# Patient Record
Sex: Male | Born: 1965
Health system: Southern US, Community
[De-identification: ages and names within clinical notes are randomized; demographics above are authoritative.]

## PROBLEM LIST (undated history)

## (undated) DIAGNOSIS — I2510 Atherosclerotic heart disease of native coronary artery without angina pectoris: Secondary | ICD-10-CM

## (undated) DIAGNOSIS — J45909 Unspecified asthma, uncomplicated: Secondary | ICD-10-CM

## (undated) DIAGNOSIS — I214 Non-ST elevation (NSTEMI) myocardial infarction: Secondary | ICD-10-CM

## (undated) DIAGNOSIS — E785 Hyperlipidemia, unspecified: Secondary | ICD-10-CM

## (undated) DIAGNOSIS — L409 Psoriasis, unspecified: Secondary | ICD-10-CM

## (undated) DIAGNOSIS — J019 Acute sinusitis, unspecified: Secondary | ICD-10-CM

## (undated) DIAGNOSIS — J310 Chronic rhinitis: Secondary | ICD-10-CM

## (undated) DIAGNOSIS — I10 Essential (primary) hypertension: Secondary | ICD-10-CM

## (undated) DIAGNOSIS — I341 Nonrheumatic mitral (valve) prolapse: Secondary | ICD-10-CM

## (undated) DIAGNOSIS — Z9889 Other specified postprocedural states: Secondary | ICD-10-CM

## (undated) DIAGNOSIS — I249 Acute ischemic heart disease, unspecified: Secondary | ICD-10-CM

## (undated) DIAGNOSIS — K922 Gastrointestinal hemorrhage, unspecified: Secondary | ICD-10-CM

## (undated) HISTORY — PX: WISDOM TOOTH EXTRACTION: SHX21

## (undated) HISTORY — PX: OTHER SURGICAL HISTORY: SHX169

## (undated) HISTORY — DX: Chronic rhinitis: J31.0

## (undated) HISTORY — DX: Psoriasis, unspecified: L40.9

## (undated) HISTORY — DX: Unspecified asthma, uncomplicated: J45.909

## (undated) HISTORY — DX: Hyperlipidemia, unspecified: E78.5

## (undated) HISTORY — DX: Nonrheumatic mitral (valve) prolapse: I34.1

## (undated) HISTORY — PX: CORONARY ANGIOPLASTY WITH STENT PLACEMENT: SHX49

---
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
## 2011-05-27 LAB — PULMONARY FUNCTION TEST

## 2011-12-15 ENCOUNTER — Ambulatory Visit (INDEPENDENT_AMBULATORY_CARE_PROVIDER_SITE_OTHER): Payer: Managed Care, Other (non HMO) | Admitting: Internal Medicine

## 2011-12-15 ENCOUNTER — Other Ambulatory Visit (INDEPENDENT_AMBULATORY_CARE_PROVIDER_SITE_OTHER): Payer: Managed Care, Other (non HMO)

## 2011-12-15 ENCOUNTER — Encounter: Payer: Self-pay | Admitting: Internal Medicine

## 2011-12-15 DIAGNOSIS — J329 Chronic sinusitis, unspecified: Secondary | ICD-10-CM

## 2011-12-15 LAB — CBC WITH DIFFERENTIAL/PLATELET
Basophils Relative: 0.6 % (ref 0.0–3.0)
Eosinophils Relative: 22.2 % — ABNORMAL HIGH (ref 0.0–5.0)
Lymphocytes Relative: 23.1 % (ref 12.0–46.0)
MCV: 90.4 fl (ref 78.0–100.0)
Monocytes Absolute: 0.6 10*3/uL (ref 0.1–1.0)
Monocytes Relative: 7.3 % (ref 3.0–12.0)
Neutrophils Relative %: 46.8 % (ref 43.0–77.0)
RBC: 4.48 Mil/uL (ref 4.22–5.81)
WBC: 8.4 10*3/uL (ref 4.5–10.5)

## 2011-12-15 MED ORDER — AMOXICILLIN-POT CLAVULANATE 875-125 MG PO TABS: 1.0000 | ORAL_TABLET | Freq: Two times a day (BID) | ORAL | Status: DC

## 2011-12-15 NOTE — Patient Instructions (Signed)
Order- limited Ct of sinuses    Dx chronic sinusitis              Lab- CBC w/ diff, sed rate, IgE level  I recommend trying saline rinse with a Neti pot or squeeze bottle once or twice daily   Script augmentin

## 2011-12-15 NOTE — Progress Notes (Signed)
Patient ID: Calvin [name], male   DOB: Oct 30, 1966, 45 y.o.   MRN: [id number]  12/15/11- 71 yoM never smoker referred by Dr [name], PCP @ [facility]. Patient is concerned with recurrent sinusitis and dyspnea., Thjis started while living in an old house that had been closed up, in February, 2011. Has gotten temporary relief every 2-3 months with a round of Zpak/Symbicort. By Sept, 2012, back to Dr [name] who sent him to pulmonologist at [facility]. Got PFT read as mild obstructive disease, though I would read it as normal.  He feels tightness in nose and throat. Denies fever, nodes, ear ache. Nasonex helps some when he can get it into nose. Clears briefly w/ hot shower. Coughs productively at times- blackish gray to pinkish in color.  Had prolonged sinusitis in his 20's- prolonged antibiotics, no ENT surgery. Never considered himself an allergic person.  Hx gutate psoriasis.  Declines flu vaccine. 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  ROS-see HPI Constitutional:   15 lb weight loss- 1 year. night sweats, fevers, chills, fatigue, lassitude. HEENT:   No-  headaches, difficulty swallowing, tooth/dental problems, sore throat,       +  sneezing, itching, ear ache,  +nasal congestion, post nasal drip,  CV:  No-   chest pain, orthopnea, PND, swelling in lower extremities, anasarca, dizziness, palpitations Resp: No-   shortness of breath with exertion or at rest.              No-   productive cough,  No non-productive cough,  No- coughing up of blood.              No-   change in color of mucus.  No- wheezing.   Skin: No-   rash or lesions. GI:  No-   heartburn, indigestion, abdominal pain, nausea, vomiting, diarrhea,                 change in bowel habits, loss of appetite GU: No-   dysuria, change in color of urine, no urgency or frequency.  No- flank pain. MS:  joint pain or swelling - hands.  No- decreased range of motion.  No- back pain. Neuro-     nothing unusual Psych:  No-  change in mood or affect. No depression or anxiety.  No memory loss.  OBJ General- Alert, Oriented, Affect-appropriate, Distress- none acute Skin- rash-none, lesions- none, excoriation- none Lymphadenopathy- none Head- atraumatic            Eyes- Gross vision intact, PERRLA, conjunctivae clear secretions            Ears- Hearing, canals-normal            Nose- Thick white nasal mucus, stuffy,  no-Septal dev, , polyps, erosion, perforation             Throat- Mallampati II , mucosa clear , drainage- none, tonsils- atrophic, very hoarse Neck- flexible , trachea midline, no stridor , thyroid nl, carotid no bruit Chest - symmetrical excursion , unlabored           Heart/CV- RRR , no murmur , no gallop  , no rub, nl s1 s2                           - JVD- none , edema- none, stasis changes- none, varices- none           Lung- clear to P&A, wheeze- none, cough- none , dullness-none,  rub- none           Chest wall-  Abd- tender-no, distended-no, bowel sounds-present, HSM- no Br/ Gen/ Rectal- Not done, not indicated Extrem- cyanosis- none, clubbing, none, atrophy- none, strength- nl Neuro- grossly intact to observation

## 2011-12-17 ENCOUNTER — Ambulatory Visit (INDEPENDENT_AMBULATORY_CARE_PROVIDER_SITE_OTHER)
Admission: RE | Admit: 2011-12-17 | Discharge: 2011-12-17 | Disposition: A | Discharge status: Home / Self Care | Payer: Managed Care, Other (non HMO) | Source: Ambulatory Visit | Attending: Internal Medicine | Admitting: Internal Medicine

## 2011-12-17 DIAGNOSIS — J329 Chronic sinusitis, unspecified: Secondary | ICD-10-CM

## 2011-12-19 DIAGNOSIS — J320 Chronic maxillary sinusitis: Secondary | ICD-10-CM | POA: Insufficient documentation

## 2011-12-19 NOTE — Assessment & Plan Note (Addendum)
Initial inflammatory exposure was probably related to the old house prior to Feb, 2011. We will start treatment for bacterial sinusitis, anticipating need for surgical evaluation for drainage;. Lower airway symptoms are minimal and mostly reactive, secondary to sinus inflammation.  We can assess later whether an allergy workup is needed.  Plan- augmentin, saline rinse, Ct sinus, CBC w/ diff, sed rate, IgE,  Consider ANCA and allergy w/u later.

## 2011-12-20 NOTE — Progress Notes (Signed)
Quick Note:  Spoke with pt and notified of results per Dr. Young. Pt verbalized understanding and denied any questions.  ______ 

## 2011-12-27 NOTE — Progress Notes (Signed)
Quick Note:  LMTCB. 12-27-2011 ______ 

## 2011-12-30 ENCOUNTER — Telehealth: Payer: Self-pay | Admitting: Internal Medicine

## 2011-12-30 NOTE — Telephone Encounter (Signed)
I spoke with spouse and is aware of the results and will make pt aware. Nothing further was needed

## 2011-12-30 NOTE — Telephone Encounter (Signed)
I spoke with patient about results and he verbalized understanding and had no questions 

## 2012-01-17 ENCOUNTER — Other Ambulatory Visit: Payer: Managed Care, Other (non HMO)

## 2012-01-17 ENCOUNTER — Encounter: Payer: Self-pay | Admitting: Internal Medicine

## 2012-01-17 ENCOUNTER — Ambulatory Visit (INDEPENDENT_AMBULATORY_CARE_PROVIDER_SITE_OTHER): Payer: Managed Care, Other (non HMO) | Admitting: Internal Medicine

## 2012-01-17 DIAGNOSIS — J309 Allergic rhinitis, unspecified: Secondary | ICD-10-CM

## 2012-01-17 DIAGNOSIS — J329 Chronic sinusitis, unspecified: Secondary | ICD-10-CM

## 2012-01-17 MED ORDER — AMOXICILLIN-POT CLAVULANATE 875-125 MG PO TABS: 1.0000 | ORAL_TABLET | Freq: Two times a day (BID) | ORAL | Status: DC

## 2012-01-17 NOTE — Progress Notes (Signed)
Patient ID: Calvin [name], male   DOB: 1966/07/13, 46 y.o.   MRN: [id number]  12/15/11- 35 yoM never smoker referred by Dr [name], PCP @ [facility]. Patient is concerned with recurrent sinusitis and dyspnea., Thjis started while living in an old house that had been closed up, in February, 2011. Has gotten temporary relief every 2-3 months with a round of Zpak/Symbicort. By Sept, 2012, back to Dr [name] who sent him to pulmonologist at [facility]. Got PFT read as mild obstructive disease, though I would read it as normal.  He feels tightness in nose and throat. Denies fever, nodes, ear ache. Nasonex helps some when he can get it into nose. Clears briefly w/ hot shower. Coughs productively at times- blackish gray to pinkish in color.  Had prolonged sinusitis in his 20's- prolonged antibiotics, no ENT surgery. Never considered himself an allergic person.  Hx gutate psoriasis.  Declines flu vaccine. 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  01/17/12- 45 yoM never smoker referred by Dr [name], PCP @ [facility]. Patient is concerned with recurrent sinusitis and dyspnea. Doing better since last visit. He notices less head congestion and more postnasal drip. Cough is no longer productive but occasionally still will cough up scant purulent sputum. Nasal discharge is yellow brown. Regular use of saline rinse does help. He had taken Augmentin for 10 days and has now been off of it for 3 weeks.  Lab -12/19/ 2012: IgE 81.6, sedimentation rate 9, eosinophils 22.2%. CT sinus 12/17/11 IMPRESSION:  Pansinus inflammatory changes including fluid levels compatible  with acute sinusitis. Prior maxillary antrostomies suspected.  Sinonasal polyposis not excluded.  Original Report Authenticated By: Harley Hallmark, M.D.     ROS-see HPI Constitutional:   15 lb weight loss- 1 year. night sweats, fevers, chills, fatigue, lassitude. HEENT:   No-  headaches, difficulty swallowing, tooth/dental  problems, sore throat,       +  sneezing, itching, ear ache,  +nasal congestion, post nasal drip,  CV:  No-   chest pain, orthopnea, PND, swelling in lower extremities, anasarca, dizziness, palpitations Resp: No-   shortness of breath with exertion or at rest.              +   productive cough,  No non-productive cough,  No- coughing up of blood.             +  change in color of mucus.  No- wheezing.   Skin: No-   rash or lesions. GI:  No-   heartburn, indigestion, abdominal pain, nausea, vomiting, diarrhea,                 change in bowel habits, loss of appetite GU: MS:  joint pain or swelling - hands.  No- decreased range of motion.  No- back pain. Neuro-     nothing unusual Psych:  No- change in mood or affect. No depression or anxiety.  No memory loss.  OBJ General- Alert, Oriented, Affect-appropriate, Distress- none acute Skin- rash-none, lesions- none, excoriation- none Lymphadenopathy- none Head- atraumatic            Eyes- Gross vision intact, PERRLA, conjunctivae clear secretions            Ears- Hearing, canals-normal            Nose- Thick white nasal mucus, L nostril, pale/watery mucosa R  no-Septal dev, , polyps, erosion, perforation             Throat- Mallampati  II , mucosa clear , drainage- none, tonsils- atrophic, very hoarse Neck- flexible , trachea midline, no stridor , thyroid nl, carotid no bruit Chest - symmetrical excursion , unlabored           Heart/CV- RRR , no murmur , no gallop  , no rub, nl s1 s2                           - JVD- none , edema- none, stasis changes- none, varices- none           Lung- wet rhonchi, cough- none , dullness-none, rub- none           Chest wall-  Abd- Br/ Gen/ Rectal- Not done, not indicated Extrem- cyanosis- none, clubbing, none, atrophy- none, strength- nl Neuro- grossly intact to observation

## 2012-01-17 NOTE — Patient Instructions (Signed)
Script sent to repeat the course of Augmentin antibiotic  Continue using saline rinses  Order lab- Allergy profile   Dx allergic rhinitis

## 2012-01-19 LAB — ALLERGY FULL PROFILE
Alternaria Alternata: <0.1 kU/L (ref <0.35)
Aspergillus fumigatus, IgG: <0.1 kU/L (ref <0.35)
Bahia Grass: <0.1 kU/L (ref <0.35)
Bermuda Grass: <0.1 kU/L (ref <0.35)
Cat Dander: <0.1 kU/L (ref <0.35)
D. farinae: <0.1 kU/L (ref <0.35)
Dog Dander: <0.1 kU/L (ref <0.35)
Elm IgE: <0.1 kU/L (ref <0.35)
G009 Red Top: <0.1 kU/L (ref <0.35)
Helminthosporium halodes: <0.1 kU/L (ref <0.35)
Lamb's Quarters: <0.1 kU/L (ref <0.35)
Plantain: <0.1 kU/L (ref <0.35)
Sycamore Tree: <0.1 kU/L (ref <0.35)

## 2012-01-20 NOTE — Assessment & Plan Note (Signed)
Fungal sinusitis or allergic fungal sinusitis. He had had prior sinus surgery. If he doesn't clear with current therapy then he is aware that I will want to send him to ENT.

## 2012-01-20 NOTE — Progress Notes (Signed)
Quick Note:  LMTCB ______ 

## 2012-01-26 NOTE — Progress Notes (Signed)
Quick Note:  LMTCB ______ 

## 2012-02-01 NOTE — Progress Notes (Signed)
Quick Note:  Spoke with patients wife-aware of results and will relay message to patient-if any questions or concerns patient will call me back. ______

## 2012-02-21 ENCOUNTER — Encounter: Payer: Self-pay | Admitting: Internal Medicine

## 2012-02-21 ENCOUNTER — Ambulatory Visit (INDEPENDENT_AMBULATORY_CARE_PROVIDER_SITE_OTHER): Payer: Self-pay | Admitting: Internal Medicine

## 2012-02-21 DIAGNOSIS — J45909 Unspecified asthma, uncomplicated: Secondary | ICD-10-CM

## 2012-02-21 DIAGNOSIS — J329 Chronic sinusitis, unspecified: Secondary | ICD-10-CM

## 2012-02-21 MED ORDER — ALBUTEROL SULFATE HFA 108 (90 BASE) MCG/ACT IN AERS: 2.0000 | INHALATION_SPRAY | Freq: Four times a day (QID) | RESPIRATORY_TRACT | Status: AC | PRN

## 2012-02-21 MED ORDER — CLARITHROMYCIN 500 MG PO TABS: ORAL_TABLET | ORAL | Status: AC

## 2012-02-21 NOTE — Patient Instructions (Signed)
Script sent for clarithromycin/ Biaxin antibiotic for sinusitis  Script sent for rescue albuterol inhaler to use if needed as a supplement to [facility]

## 2012-02-21 NOTE — Progress Notes (Signed)
Patient ID: Calvin [name], male   DOB: August 07, 1966, 46 y.o.   MRN: [id number]  12/15/11- 1 yoM never smoker referred by Dr [name], PCP @ [facility]. Patient is concerned with recurrent sinusitis and dyspnea., Thjis started while living in an old house that had been closed up, in February, 2011. Has gotten temporary relief every 2-3 months with a round of Zpak/Symbicort. By Sept, 2012, back to Dr [name] who sent him to pulmonologist at [facility]. Got PFT read as mild obstructive disease, though I would read it as normal.  He feels tightness in nose and throat. Denies fever, nodes, ear ache. Nasonex helps some when he can get it into nose. Clears briefly w/ hot shower. Coughs productively at times- blackish gray to pinkish in color.  Had prolonged sinusitis in his 20's- prolonged antibiotics, no ENT surgery. Never considered himself an allergic person.  Hx gutate psoriasis.  Declines flu vaccine. 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  01/17/12- 46 yoM never smoker referred by Dr [name], PCP @ [facility]. Patient is concerned with recurrent sinusitis and dyspnea. Doing better since last visit. He notices less head congestion and more postnasal drip. Cough is no longer productive but occasionally still will cough up scant purulent sputum. Nasal discharge is yellow brown. Regular use of saline rinse does help. He had taken Augmentin for 10 days and has now been off of it for 3 weeks.  Lab -12/19/ 2012: IgE 81.6, sedimentation rate 9, eosinophils 22.2%. CT sinus 12/17/11 IMPRESSION:  Pansinus inflammatory changes including fluid levels compatible  with acute sinusitis. Prior maxillary antrostomies suspected.  Sinonasal polyposis not excluded.  Original Report Authenticated By: Harley Hallmark, M.D.   02/21/12- 46 yoM never smoker referred by Dr [name], PCP @ [facility]. Patient is concerned with recurrent sinusitis and dyspnea He took Augmentin for 10 days in December and  again in January. Was doing much better. Still blowing some bloody mucus after rinsing with Nettie pot. Allergy profile 01/17/2012-negative with total IgE 110.8 Admits occasional wheeze. Exercising will sometimes trigger wheeze. He continues Dulera.  ROS-see HPI Constitutional:   15 lb weight loss- 1 year. night sweats, fevers, chills, fatigue, lassitude. HEENT:   No-  headaches, difficulty swallowing, tooth/dental problems, sore throat,       +  sneezing, itching, ear ache,  +nasal congestion, post nasal drip,  CV:  No-   chest pain, orthopnea, PND, swelling in lower extremities, anasarca, dizziness, palpitations Resp: No-   shortness of breath with exertion or at rest.              No   productive cough,   non-productive cough,  No- coughing up of blood.             +  change in color of mucus.  + wheezing.   Skin: No-   rash or lesions. GI:  No-   heartburn, indigestion, abdominal pain, nausea, vomiting, diarrhea,                 change in bowel habits, loss of appetite GU: MS:  joint pain or swelling - hands.  No- decreased range of motion.  No- back pain. Neuro-     nothing unusual Psych:  No- change in mood or affect. No depression or anxiety.  No memory loss.  OBJ General- Alert, Oriented, Affect-appropriate, Distress- none acute Skin- rash-none, lesions- none, excoriation- none Lymphadenopathy- none Head- atraumatic  Eyes- Gross vision intact, PERRLA, conjunctivae clear secretions            Ears- Hearing, canals-normal            Nose- Mucus and blood L>R L nostril, pale/watery mucosa R  no-Septal dev, , polyps, erosion, perforation             Throat- Mallampati II , mucosa clear , drainage- none, tonsils- atrophic, very hoarse Neck- flexible , trachea midline, no stridor , thyroid nl, carotid no bruit Chest - symmetrical excursion , unlabored           Heart/CV- RRR , no murmur , no gallop  , no rub, nl s1 s2                           - JVD- none , edema- none,  stasis changes- none, varices- none           Lung- trace wheeze, cough- none , dullness-none, rub- none           Chest wall-  Abd- Br/ Gen/ Rectal- Not done, not indicated Extrem- cyanosis- none, clubbing, none, atrophy- none, strength- nl Neuro- grossly intact to observation

## 2012-02-25 DIAGNOSIS — J45998 Other asthma: Secondary | ICD-10-CM | POA: Insufficient documentation

## 2012-02-25 NOTE — Assessment & Plan Note (Signed)
Residual exertional asthma fairly well controlled with Dulera.

## 2012-02-25 NOTE — Assessment & Plan Note (Signed)
We discussed options especially referral to ENT. Plan-Biaxin x2 weeks. Continue saline rinse.

## 2012-03-21 ENCOUNTER — Encounter: Payer: Self-pay | Admitting: Internal Medicine

## 2012-03-21 ENCOUNTER — Ambulatory Visit (INDEPENDENT_AMBULATORY_CARE_PROVIDER_SITE_OTHER): Payer: Private Health Insurance - Indemnity | Admitting: Internal Medicine

## 2012-03-21 DIAGNOSIS — J339 Nasal polyp, unspecified: Secondary | ICD-10-CM

## 2012-03-21 DIAGNOSIS — J329 Chronic sinusitis, unspecified: Secondary | ICD-10-CM

## 2012-03-21 DIAGNOSIS — J45909 Unspecified asthma, uncomplicated: Secondary | ICD-10-CM

## 2012-03-21 MED ORDER — PHENYLEPHRINE HCL 1 % NA SOLN
3.0000 [drp] | Freq: Once | NASAL | Status: AC
  Administered 2012-03-21: 3 [drp] via NASAL

## 2012-03-21 MED ORDER — MOMETASONE FUROATE 50 MCG/ACT NA SUSP: 2.0000 | Freq: Every day | NASAL | Status: AC

## 2012-03-21 MED ORDER — MOMETASONE FURO-FORMOTEROL FUM 200-5 MCG/ACT IN AERO: 2.0000 | INHALATION_SPRAY | Freq: Two times a day (BID) | RESPIRATORY_TRACT | Status: DC

## 2012-03-21 NOTE — Patient Instructions (Addendum)
Neb neo nasal- done  Sample/ script nasonex nasal steroid spray    1-2 sprays in each  Nostril once daily at bedtime    "Aim for the eye"  Script to increase Dulera to the 200 strength     2 puffs and rinse mouth well, twice daily

## 2012-03-21 NOTE — Progress Notes (Signed)
Patient ID: Calvin [name], male   DOB: 1966-03-26, 46 y.o.   MRN: [id number]  12/15/11- 46 yoM never smoker referred by Dr [name], PCP @ [facility]. Patient is concerned with recurrent sinusitis and dyspnea., Thjis started while living in an old house that had been closed up, in February, 2011. Has gotten temporary relief every 2-3 months with a round of Zpak/Symbicort. By Sept, 2012, back to Dr [name] who sent him to pulmonologist at [facility]. Got PFT read as mild obstructive disease, though I would read it as normal.  He feels tightness in nose and throat. Denies fever, nodes, ear ache. Nasonex helps some when he can get it into nose. Clears briefly w/ hot shower. Coughs productively at times- blackish gray to pinkish in color.  Had prolonged sinusitis in his 20's- prolonged antibiotics, no ENT surgery. Never considered himself an allergic person.  Hx gutate psoriasis.  Declines flu vaccine. 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  01/17/12- 45 yoM never smoker referred by Dr [name], PCP @ [facility]. Patient is concerned with recurrent sinusitis and dyspnea. Doing better since last visit. He notices less head congestion and more postnasal drip. Cough is no longer productive but occasionally still will cough up scant purulent sputum. Nasal discharge is yellow brown. Regular use of saline rinse does help. He had taken Augmentin for 10 days and has now been off of it for 3 weeks.  Lab -12/19/ 2012: IgE 81.6, sedimentation rate 9, eosinophils 22.2%. CT sinus 12/17/11 IMPRESSION:  Pansinus inflammatory changes including fluid levels compatible  with acute sinusitis. Prior maxillary antrostomies suspected.  Sinonasal polyposis not excluded.  Original Report Authenticated By: Harley Hallmark, M.D.   02/21/12- 46 yoM never smoker referred by Dr [name], PCP @ [facility] Patient is concerned with recurrent sinusitis and dyspnea He took Augmentin for 10 days in December and  again in January. Was doing much better. Still blowing some bloody mucus after rinsing with Nettie pot. Allergy profile 01/17/2012-negative with total IgE 110.8 Admits occasional wheeze. Exercising will sometimes trigger wheeze. He continues Dulera.  03/21/12- 46 yoM never smoker referred by Dr [name], PCP @ [facility]. Patient is concerned with recurrent sinusitis and dyspnea Neti Pot saline rinse clears his head.  He has noticed increased chest tightness and wheeze since last here. Had taken 2 weeks of Biaxin for the sinusitis. Now has no headache , much less bloody nasal discharge with occasional bright yellow still.  Needs refill Dulera 100. He had used Singulair in the past.  ROS-see HPI Constitutional:   15 lb weight loss- 1 year. night sweats, fevers, chills, fatigue, lassitude. HEENT:   No-  headaches, difficulty swallowing, tooth/dental problems, sore throat,       +  sneezing, itching, ear ache,  +nasal congestion, post nasal drip,  CV:  No-   chest pain, orthopnea, PND, swelling in lower extremities, anasarca, dizziness, palpitations Resp: No-   shortness of breath with exertion or at rest.              No   productive cough,   non-productive cough,  No- coughing up of blood.             +  change in color of mucus.  + wheezing.   Skin: No-   rash or lesions. GI:  No-   heartburn, indigestion, abdominal pain, nausea, vomiting, te GU: MS:  joint pain or swelling - hands.   Neuro-     nothing unusual  Psych:  No- change in mood or affect. No depression or anxiety.  No memory loss.  OBJ General- Alert, Oriented, Affect-appropriate, Distress- none acute Skin- rash-none, lesions- none, excoriation- none Lymphadenopathy- none Head- atraumatic            Eyes- Gross vision intact, PERRLA, conjunctivae clear secretions            Ears- Hearing, canals-normal            Nose- +Polyp in right nostril ( confirmed by persistence after nasal neosynephrine neb), Mucus and blood L>R L  nostril, pale/watery mucosa R,  no-Septal dev,  erosion, perforation             Throat- Mallampati II , mucosa clear , drainage- none, tonsils- atrophic, very hoarse Neck- flexible , trachea midline, no stridor , thyroid nl, carotid no bruit Chest - symmetrical excursion , unlabored           Heart/CV- RRR , no murmur , no gallop  , no rub, nl s1 s2                           - JVD- none , edema- none, stasis changes- none, varices- none           Lung- bilateral wheeze, cough- none , dullness-none, rub- none           Chest wall-  Abd- Br/ Gen/ Rectal- Not done, not indicated Extrem- cyanosis- none, clubbing, none, atrophy- none, strength- nl Neuro- grossly intact to observation

## 2012-03-26 DIAGNOSIS — J339 Nasal polyp, unspecified: Secondary | ICD-10-CM | POA: Insufficient documentation

## 2012-03-26 NOTE — Assessment & Plan Note (Signed)
No history of aspirin allergy. Mechanical obstruction by polyps can contribute significantly to secretion retention and persistence of sinus infection. If this doesn't respond to nasal steroid spray , we will make ENT referral.

## 2012-03-26 NOTE — Assessment & Plan Note (Signed)
Symptoms now weight his dyspnea complaint most consistent with asthma.  Plan- anticipate PFT/ spirometry. Dulera 200. Discussed maintenance controller vs rescue meds.

## 2012-03-26 NOTE — Assessment & Plan Note (Signed)
Clinical improvement after 2 weeks of Biaxin and ongoing use of nasal saline rinse.  Plan-Nasonex.

## 2012-05-25 ENCOUNTER — Ambulatory Visit: Payer: Private Health Insurance - Indemnity | Admitting: Internal Medicine

## 2012-06-26 ENCOUNTER — Other Ambulatory Visit: Payer: Private Health Insurance - Indemnity

## 2012-06-26 ENCOUNTER — Encounter: Payer: Self-pay | Admitting: Internal Medicine

## 2012-06-26 ENCOUNTER — Ambulatory Visit (INDEPENDENT_AMBULATORY_CARE_PROVIDER_SITE_OTHER): Payer: Private Health Insurance - Indemnity | Admitting: Internal Medicine

## 2012-06-26 VITALS — BP 114/80 | HR 84 | Ht 73.0 in | Wt 202.2 lb

## 2012-06-26 DIAGNOSIS — J320 Chronic maxillary sinusitis: Secondary | ICD-10-CM

## 2012-06-26 DIAGNOSIS — J45909 Unspecified asthma, uncomplicated: Secondary | ICD-10-CM

## 2012-06-26 DIAGNOSIS — J329 Chronic sinusitis, unspecified: Secondary | ICD-10-CM

## 2012-06-26 DIAGNOSIS — J45998 Other asthma: Secondary | ICD-10-CM

## 2012-06-26 DIAGNOSIS — J339 Nasal polyp, unspecified: Secondary | ICD-10-CM

## 2012-06-26 MED ORDER — ROFLUMILAST 500 MCG PO TABS: 500.0000 ug | ORAL_TABLET | Freq: Every day | ORAL | Status: DC

## 2012-06-26 NOTE — Patient Instructions (Addendum)
Order- immunoglobulin levels- IgA, IgG, IgM,   Also IgE    Dx recurrent sinusitis             Schedule PFT-  Dx asthma with bronchitis  Sample / script Daliresp       1/2 tab once every other day with food, x 2 weeks. Then if tolerated, gradually try to increase to one tab, once daily.

## 2012-06-26 NOTE — Progress Notes (Signed)
Patient ID: Calvin [name], male   DOB: [birth year], 46 y.o.   MRN: [id number]  12/15/11- 13 yoM never smoker referred by Dr [name], PCP @ [facility]. Patient is concerned with recurrent sinusitis and dyspnea., Thjis started while living in an old house that had been closed up, in February, 2011. Has gotten temporary relief every 2-3 months with a round of Zpak/Symbicort. By Sept, 2012, back to Dr [name] who sent him to pulmonologist at [facility]. Got PFT read as mild obstructive disease, though I would read it as normal.  He feels tightness in nose and throat. Denies fever, nodes, ear ache. Nasonex helps some when he can get it into nose. Clears briefly w/ hot shower. Coughs productively at times- blackish gray to pinkish in color.  Had prolonged sinusitis in his 20's- prolonged antibiotics, no ENT surgery. Never considered himself an allergic person.  Hx gutate psoriasis.  Declines flu vaccine. 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  01/17/12- 45 yoM never smoker referred by Dr [name], PCP @ [facility]. Patient is concerned with recurrent sinusitis and dyspnea. Doing better since last visit. He notices less head congestion and more postnasal drip. Cough is no longer productive but occasionally still will cough up scant purulent sputum. Nasal discharge is yellow brown. Regular use of saline rinse does help. He had taken Augmentin for 10 days and has now been off of it for 3 weeks.  Lab -12/19/ 2012: IgE 81.6, sedimentation rate 9, eosinophils 22.2%. CT sinus 12/17/11 IMPRESSION:  Pansinus inflammatory changes including fluid levels compatible  with acute sinusitis. Prior maxillary antrostomies suspected.  Sinonasal polyposis not excluded.  Original Report Authenticated By: Harley Hallmark, M.D.   02/21/12- 67 yoM never smoker referred by Dr [name], PCP @ [facility]. Patient is concerned with recurrent sinusitis and dyspnea He took Augmentin for 10 days in December and  again in January. Was doing much better. Still blowing some bloody mucus after rinsing with Nettie pot. Allergy profile 01/17/2012-negative with total IgE 110.8 Admits occasional wheeze. Exercising will sometimes trigger wheeze. He continues Dulera.  03/21/12- 45 yoM never smoker referred by Dr [name], PCP @ [facility]. Patient is concerned with recurrent sinusitis and dyspnea Neti Pot saline rinse clears his head.  He has noticed increased chest tightness and wheeze since last here. Had taken 2 weeks of Biaxin for the sinusitis. Now has no headache , much less bloody nasal discharge with occasional bright yellow still.  Needs refill Dulera 100. He had used Singulair in the past.  06/26/12- 45 yoM never smoker referred by Dr [name], PCP @ [facility]. Patient is concerned with recurrent sinusitis and dyspnea About a week ago had wheezing, increased cough and congestion-productive-dark green to black in color; since then symptoms have stopped for the most part. Still having nasal congestion and slight wheeze at times. He was sick in June with head congestion and chest rattle which were hard to clear. Sputum was very dark. Now has only mild wheeze lying supine with scant clear to trace green mucus and mild nasal congestion, right greater than left. He failed Singulair. He can never miss a dose of Dulera 200 without t feeling some chest tightness. Continues Nasonex. Allergy profile-01/17/2012-negative for specific elevations but total IgE 110.8. Since last here he was diagnosed with guttate psoriasis. CT max facial 12/20/11-reviewed with him IMPRESSION:  Pansinus inflammatory changes including fluid levels compatible  with acute sinusitis. Prior maxillary antrostomies suspected.  Sinonasal polyposis not excluded.  Original Report  Authenticated By: Harley Hallmark, M.D.   ROS-see HPI Constitutional:   15 lb weight loss- 1 year. night sweats, fevers, chills, fatigue, lassitude. HEENT:   No-   headaches, difficulty swallowing, tooth/dental problems, sore throat,       +  sneezing, itching, ear ache,  +nasal congestion, post nasal drip,  CV:  No-   chest pain, orthopnea, PND, swelling in lower extremities, anasarca, dizziness, palpitations Resp: No-   shortness of breath with exertion or at rest.              No   productive cough,   non-productive cough,  No- coughing up of blood.             +  change in color of mucus.  + wheezing.   Skin: No-   rash or lesions. GI:  No-   heartburn, indigestion, abdominal pain, nausea, vomiting,  GU: MS:  joint pain or swelling - hands.   Neuro-     nothing unusual Psych:  No- change in mood or affect. No depression or anxiety.  No memory loss.  OBJ General- Alert, Oriented, Affect-appropriate, Distress- none acute Skin- rash-none, lesions- none, excoriation- none Lymphadenopathy- none Head- atraumatic            Eyes- Gross vision intact, PERRLA, conjunctivae clear secretions            Ears- Hearing, canals-normal            Nose- +Polyp in right nostril , Mucus , pale/watery mucosa R,  no-Septal dev,  erosion, perforation             Throat- Mallampati II , mucosa clear , drainage- none, tonsils- atrophic, very hoarse Neck- flexible , trachea midline, no stridor , thyroid nl, carotid no bruit Chest - symmetrical excursion , unlabored           Heart/CV- RRR , no murmur , no gallop  , no rub, nl s1 s2                           - JVD- none , edema- none, stasis changes- none, varices- none           Lung- coarse wheeze L>R, cough- none , dullness-none, rub- none           Chest wall-  Abd- Br/ Gen/ Rectal- Not done, not indicated Extrem- cyanosis- none, clubbing, none, atrophy- none, strength- nl Neuro- grossly intact to observation

## 2012-06-29 ENCOUNTER — Encounter: Payer: Self-pay | Admitting: Internal Medicine

## 2012-06-29 NOTE — Assessment & Plan Note (Signed)
There is a significant bronchitis component. Daliresp might be useful if tolerated. Plan-schedule PFT.

## 2012-06-29 NOTE — Assessment & Plan Note (Signed)
We discussed surgical resection versus nasal steroids

## 2012-06-29 NOTE — Assessment & Plan Note (Signed)
By description he had another exacerbation in June. Part of the problem may be obstruction by nasal polyps. Plan-immunoglobulins, CBC with differential. We discussed return to the ENT.

## 2012-06-30 NOTE — Progress Notes (Signed)
Quick Note:  LMTCB ______ 

## 2012-07-05 ENCOUNTER — Ambulatory Visit (INDEPENDENT_AMBULATORY_CARE_PROVIDER_SITE_OTHER): Payer: Private Health Insurance - Indemnity | Admitting: Internal Medicine

## 2012-07-05 DIAGNOSIS — J45909 Unspecified asthma, uncomplicated: Secondary | ICD-10-CM

## 2012-07-05 LAB — PULMONARY FUNCTION TEST

## 2012-07-05 NOTE — Progress Notes (Signed)
PFT done today. 

## 2012-07-07 ENCOUNTER — Telehealth: Payer: Self-pay | Admitting: Internal Medicine

## 2012-07-07 NOTE — Telephone Encounter (Signed)
I spoke with patient about results and he verbalized understanding and had no questions 

## 2012-07-07 NOTE — Telephone Encounter (Signed)
Mild reductions of IgG and IgM class antibodies. Not clear that these levels are low enough to allow infection. We will discuss and may repeat at next ov.   [id number] x1

## 2012-07-07 NOTE — Progress Notes (Signed)
Quick Note:  LMTCB ______ 

## 2012-08-29 ENCOUNTER — Encounter: Payer: Self-pay | Admitting: Internal Medicine

## 2012-08-29 ENCOUNTER — Ambulatory Visit (INDEPENDENT_AMBULATORY_CARE_PROVIDER_SITE_OTHER): Payer: BC Managed Care – PPO | Admitting: Internal Medicine

## 2012-08-29 VITALS — BP 132/70 | HR 83 | Ht 73.0 in | Wt 203.4 lb

## 2012-08-29 DIAGNOSIS — J45909 Unspecified asthma, uncomplicated: Secondary | ICD-10-CM

## 2012-08-29 DIAGNOSIS — J45998 Other asthma: Secondary | ICD-10-CM

## 2012-08-29 DIAGNOSIS — J339 Nasal polyp, unspecified: Secondary | ICD-10-CM

## 2012-08-29 DIAGNOSIS — J320 Chronic maxillary sinusitis: Secondary | ICD-10-CM

## 2012-08-29 NOTE — Patient Instructions (Addendum)
Sample Dymista nasal spray    1-2 puffs each nostril once daily at bedtime  Please call as needed

## 2012-08-29 NOTE — Progress Notes (Signed)
Patient ID: Calvin [name], male   DOB: [birth year], 46 y.o.   MRN: [id number]  12/15/11- 13 yoM never smoker referred by Dr [name], PCP @ [facility]. Patient is concerned with recurrent sinusitis and dyspnea., Thjis started while living in an old house that had been closed up, in February, 2011. Has gotten temporary relief every 2-3 months with a round of Zpak/Symbicort. By Sept, 2012, back to Dr [name] who sent him to pulmonologist at [facility]. Got PFT read as mild obstructive disease, though I would read it as normal.  He feels tightness in nose and throat. Denies fever, nodes, ear ache. Nasonex helps some when he can get it into nose. Clears briefly w/ hot shower. Coughs productively at times- blackish gray to pinkish in color.  Had prolonged sinusitis in his 20's- prolonged antibiotics, no ENT surgery. Never considered himself an allergic person.  Hx gutate psoriasis.  Declines flu vaccine. 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  01/17/12- 45 yoM never smoker referred by Dr [name], PCP @ [facility]. Patient is concerned with recurrent sinusitis and dyspnea. Doing better since last visit. He notices less head congestion and more postnasal drip. Cough is no longer productive but occasionally still will cough up scant purulent sputum. Nasal discharge is yellow brown. Regular use of saline rinse does help. He had taken Augmentin for 10 days and has now been off of it for 3 weeks.  Lab -12/19/ 2012: IgE 81.6, sedimentation rate 9, eosinophils 22.2%. CT sinus 12/17/11 IMPRESSION:  Pansinus inflammatory changes including fluid levels compatible  with acute sinusitis. Prior maxillary antrostomies suspected.  Sinonasal polyposis not excluded.  Original Report Authenticated By: Harley Hallmark, M.D.   02/21/12- 67 yoM never smoker referred by Dr [name], PCP @ [facility]. Patient is concerned with recurrent sinusitis and dyspnea He took Augmentin for 10 days in December and  again in January. Was doing much better. Still blowing some bloody mucus after rinsing with Nettie pot. Allergy profile 01/17/2012-negative with total IgE 110.8 Admits occasional wheeze. Exercising will sometimes trigger wheeze. He continues Dulera.  03/21/12- 45 yoM never smoker referred by Dr [name], PCP @ [facility]. Patient is concerned with recurrent sinusitis and dyspnea Neti Pot saline rinse clears his head.  He has noticed increased chest tightness and wheeze since last here. Had taken 2 weeks of Biaxin for the sinusitis. Now has no headache , much less bloody nasal discharge with occasional bright yellow still.  Needs refill Dulera 100. He had used Singulair in the past.  06/26/12- 45 yoM never smoker referred by Dr [name], PCP @ [facility]. Patient is concerned with recurrent sinusitis and dyspnea About a week ago had wheezing, increased cough and congestion-productive-dark green to black in color; since then symptoms have stopped for the most part. Still having nasal congestion and slight wheeze at times. He was sick in June with head congestion and chest rattle which were hard to clear. Sputum was very dark. Now has only mild wheeze lying supine with scant clear to trace green mucus and mild nasal congestion, right greater than left. He failed Singulair. He can never miss a dose of Dulera 200 without t feeling some chest tightness. Continues Nasonex. Allergy profile-01/17/2012-negative for specific elevations but total IgE 110.8. Since last here he was diagnosed with guttate psoriasis. CT max facial 12/20/11-reviewed with him IMPRESSION:  Pansinus inflammatory changes including fluid levels compatible  with acute sinusitis. Prior maxillary antrostomies suspected.  Sinonasal polyposis not excluded.  Original Report  Authenticated By: Harley Hallmark, M.D.    08/29/12- 29 yoM never smoker referred by Dr [name], PCP @ [facility]. Patient is concerned with recurrent sinusitis  and dyspnea  "Much improved" since last visit; Review PFT results with patient. He did well for several weeks but overnight as developed nasal congestion while lying supine. He began his saline rinses. Denies wheeze or chest tightness. 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 PFT: 07/05/2012: Mild obstructive airways disease, small airways with insignificant response to bronchodilator. Normal volumes and diffusion. FEV1 4.13/105%, FEV1/FVC 0.66, FEF 25-75% 2.18/57%  ROS-see HPI Constitutional:   15 lb weight loss- 1 year. night sweats, fevers, chills, fatigue, lassitude. HEENT:   No-  headaches, difficulty swallowing, tooth/dental problems, sore throat,       +  sneezing, itching, ear ache,  +nasal congestion, post nasal drip,  CV:  No-   chest pain, orthopnea, PND, swelling in lower extremities, anasarca, dizziness, palpitations Resp: No-   shortness of breath with exertion or at rest.              No   productive cough,   non-productive cough,  No- coughing up of blood.             +  change in color of mucus.  + wheezing.   Skin: No-   rash or lesions. GI:  No-   heartburn, indigestion, abdominal pain, nausea, vomiting,  GU: MS:  joint pain or swelling - hands.   Neuro-     nothing unusual Psych:  No- change in mood or affect. No depression or anxiety.  No memory loss.  OBJ General- Alert, Oriented, Affect-appropriate, Distress- none acute Skin- rash-none, lesions- none, excoriation- none Lymphadenopathy- none Head- atraumatic            Eyes- Gross vision intact, PERRLA, conjunctivae clear secretions            Ears- Hearing, canals-normal            Nose- + bilateral polyps , Mucus , pale/watery mucosa R,  no-Septal dev,  erosion, perforation             Throat- Mallampati II , mucosa clear , drainage- none, tonsils- atrophic, very hoarse Neck- flexible , trachea midline, no stridor , thyroid nl, carotid no bruit Chest - symmetrical  excursion , unlabored           Heart/CV- RRR , no murmur , no gallop  , no rub, nl s1 s2                           - JVD- none , edema- none, stasis changes- none, varices- none           Lung- coarse wheeze L>R, cough- none , dullness-none, rub- none           Chest wall-  Abd- Br/ Gen/ Rectal- Not done, not indicated Extrem- cyanosis- none, clubbing, none, atrophy- none, strength- nl Neuro- grossly intact to observation

## 2012-09-05 NOTE — Assessment & Plan Note (Signed)
Currently good control of mild intermittent asthma. Some fixed obstruction only in small airways.

## 2012-09-05 NOTE — Assessment & Plan Note (Signed)
Nasal polyps confirmed. Discussed management. Plan-sample Dymista

## 2012-09-05 NOTE — Assessment & Plan Note (Signed)
Maintaining control requires suppression of nasal polyps either medically or with surgical consultation

## 2013-02-27 ENCOUNTER — Ambulatory Visit: Payer: BC Managed Care – PPO | Admitting: Internal Medicine

## 2013-04-05 ENCOUNTER — Ambulatory Visit (INDEPENDENT_AMBULATORY_CARE_PROVIDER_SITE_OTHER): Payer: BC Managed Care – PPO | Admitting: Internal Medicine

## 2013-04-05 ENCOUNTER — Encounter: Payer: Self-pay | Admitting: Internal Medicine

## 2013-04-05 VITALS — BP 120/68 | HR 65 | Ht 73.0 in | Wt 214.2 lb

## 2013-04-05 DIAGNOSIS — J339 Nasal polyp, unspecified: Secondary | ICD-10-CM

## 2013-04-05 DIAGNOSIS — J45909 Unspecified asthma, uncomplicated: Secondary | ICD-10-CM

## 2013-04-05 DIAGNOSIS — J45998 Other asthma: Secondary | ICD-10-CM

## 2013-04-05 MED ORDER — AZELASTINE-FLUTICASONE 137-50 MCG/ACT NA SUSP: 1.0000 | Freq: Every day | NASAL | Status: DC

## 2013-04-05 MED ORDER — AZELASTINE-FLUTICASONE 137-50 MCG/ACT NA SUSP: 2.0000 | Freq: Every day | NASAL | Status: DC

## 2013-04-05 MED ORDER — MOMETASONE FURO-FORMOTEROL FUM 200-5 MCG/ACT IN AERO: 2.0000 | INHALATION_SPRAY | Freq: Two times a day (BID) | RESPIRATORY_TRACT | Status: DC

## 2013-04-05 NOTE — Progress Notes (Signed)
Patient ID: Calvin [name], male   DOB: [birth year], 47 y.o.   MRN: [id number]  12/15/11- 13 yoM never smoker referred by Dr [name], PCP @ [facility]. Patient is concerned with recurrent sinusitis and dyspnea., Thjis started while living in an old house that had been closed up, in February, 2011. Has gotten temporary relief every 2-3 months with a round of Zpak/Symbicort. By Sept, 2012, back to Dr [name] who sent him to pulmonologist at [facility]. Got PFT read as mild obstructive disease, though I would read it as normal.  He feels tightness in nose and throat. Denies fever, nodes, ear ache. Nasonex helps some when he can get it into nose. Clears briefly w/ hot shower. Coughs productively at times- blackish gray to pinkish in color.  Had prolonged sinusitis in his 20's- prolonged antibiotics, no ENT surgery. Never considered himself an allergic person.  Hx gutate psoriasis.  Declines flu vaccine. 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  01/17/12- 45 yoM never smoker referred by Dr [name], PCP @ [facility]. Patient is concerned with recurrent sinusitis and dyspnea. Doing better since last visit. He notices less head congestion and more postnasal drip. Cough is no longer productive but occasionally still will cough up scant purulent sputum. Nasal discharge is yellow brown. Regular use of saline rinse does help. He had taken Augmentin for 10 days and has now been off of it for 3 weeks.  Lab -12/19/ 2012: IgE 81.6, sedimentation rate 9, eosinophils 22.2%. CT sinus 12/17/11 IMPRESSION:  Pansinus inflammatory changes including fluid levels compatible  with acute sinusitis. Prior maxillary antrostomies suspected.  Sinonasal polyposis not excluded.  Original Report Authenticated By: Harley Hallmark, M.D.   02/21/12- 67 yoM never smoker referred by Dr [name], PCP @ [facility]. Patient is concerned with recurrent sinusitis and dyspnea He took Augmentin for 10 days in December and  again in January. Was doing much better. Still blowing some bloody mucus after rinsing with Nettie pot. Allergy profile 01/17/2012-negative with total IgE 110.8 Admits occasional wheeze. Exercising will sometimes trigger wheeze. He continues Dulera.  03/21/12- 45 yoM never smoker referred by Dr [name], PCP @ [facility]. Patient is concerned with recurrent sinusitis and dyspnea Neti Pot saline rinse clears his head.  He has noticed increased chest tightness and wheeze since last here. Had taken 2 weeks of Biaxin for the sinusitis. Now has no headache , much less bloody nasal discharge with occasional bright yellow still.  Needs refill Dulera 100. He had used Singulair in the past.  06/26/12- 45 yoM never smoker referred by Dr [name], PCP @ [facility]. Patient is concerned with recurrent sinusitis and dyspnea About a week ago had wheezing, increased cough and congestion-productive-dark green to black in color; since then symptoms have stopped for the most part. Still having nasal congestion and slight wheeze at times. He was sick in June with head congestion and chest rattle which were hard to clear. Sputum was very dark. Now has only mild wheeze lying supine with scant clear to trace green mucus and mild nasal congestion, right greater than left. He failed Singulair. He can never miss a dose of Dulera 200 without t feeling some chest tightness. Continues Nasonex. Allergy profile-01/17/2012-negative for specific elevations but total IgE 110.8. Since last here he was diagnosed with guttate psoriasis. CT max facial 12/20/11-reviewed with him IMPRESSION:  Pansinus inflammatory changes including fluid levels compatible  with acute sinusitis. Prior maxillary antrostomies suspected.  Sinonasal polyposis not excluded.  Original Report  Authenticated By: Harley Hallmark, M.D.    08/29/12- 3 yoM never smoker referred by Dr [name], PCP @ [facility]. Patient is concerned with recurrent sinusitis  and dyspnea  "Much improved" since last visit; Review PFT results with patient. He did well for several weeks but overnight as developed nasal congestion while lying supine. He began his saline rinses. Denies wheeze or chest tightness. 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 PFT: 07/05/2012: Mild obstructive airways disease, small airways with insignificant response to bronchodilator. Normal volumes and diffusion. FEV1 4.13/105%, FEV1/FVC 0.66, FEF 25-75% 2.18/57%  04/05/13- 46 yoM never smoker referred by Dr [name], PCP @ [facility]. Patient is concerned with recurrent sinusitis and dyspnea, Nasal polyp/not aspirin allergic, asthma FOLLOWS FOR:  pt. reports he is doing well.  Pt. denies any sob, wheezing or coughing. Pt. states breathing is better,  stays congested. He had a better winter. His nose currently is not block but is watery. Dulera controls his asthma and he notices if he misses a couple of doses.  ROS-see HPI Constitutional:   No-. night sweats, fevers, chills, fatigue, lassitude. HEENT:   No-  headaches, difficulty swallowing, tooth/dental problems, sore throat,       No-sneezing, itching, ear ache,  +Variable nasal congestion, +post nasal drip,  CV:  No-   chest pain, orthopnea, PND, swelling in lower extremities, anasarca, dizziness, palpitations Resp: No-   shortness of breath with exertion or at rest.              No   productive cough,   non-productive cough,  No- coughing up of blood.             No- change in color of mucus. No-wheezing.   Skin: No-   rash or lesions. GI:  No-   heartburn, indigestion, abdominal pain, nausea, vomiting,  GU: MS:  joint pain or swelling - hands.   Neuro-     nothing unusual Psych:  No- change in mood or affect. No depression or anxiety.  No memory loss.  OBJ General- Alert, Oriented, Affect-appropriate, Distress- none acute Skin- rash-none, lesions- none, excoriation-  none Lymphadenopathy- none Head- atraumatic            Eyes- Gross vision intact, PERRLA, conjunctivae clear secretions            Ears- Hearing, canals-normal            Nose- + R polyp , Mucus , pale/watery mucosa ,  no-Septal dev,  erosion, perforation             Throat- Mallampati II , mucosa clear , drainage- none, tonsils- atrophic, Neck- flexible , trachea midline, no stridor , thyroid nl, carotid no bruit Chest - symmetrical excursion , unlabored           Heart/CV- RRR , no murmur , no gallop  , no rub, nl s1 s2                           - JVD- none , edema- none, stasis changes- none, varices- none           Lung- no-wheezing, cough- none , dullness-none, rub- none           Chest wall-  Abd- Br/ Gen/ Rectal- Not done, not indicated Extrem- cyanosis- none, clubbing, none, atrophy- none, strength- nl Neuro- grossly intact to observation

## 2013-04-05 NOTE — Patient Instructions (Addendum)
Sample, script and savings coupon for Dymista nasal spray (steroid + antihistamine)  Try 1-2 puffs each nostril every night at bedtime instead of fluticasone.  Script for [employer] refill  You have a nasal polyp. If regular use of either fluticasone or Dymista doesn't gradually oen that right side up, then let me know.

## 2013-04-14 NOTE — Assessment & Plan Note (Signed)
We discussed management options. She will continue nasal steroid spray, trying sample and prescription for Dymista.

## 2013-04-14 NOTE — Assessment & Plan Note (Signed)
He can tell the [facility] improves his breathing and he gets tighter without it, supporting a diagnosis of at least mild asthma.

## 2013-05-16 ENCOUNTER — Other Ambulatory Visit: Payer: Self-pay | Admitting: Internal Medicine

## 2013-07-17 ENCOUNTER — Ambulatory Visit (INDEPENDENT_AMBULATORY_CARE_PROVIDER_SITE_OTHER): Payer: Managed Care, Other (non HMO) | Admitting: Internal Medicine

## 2013-07-17 ENCOUNTER — Encounter: Payer: Self-pay | Admitting: Internal Medicine

## 2013-07-17 VITALS — BP 126/76 | HR 73 | Ht 73.0 in | Wt 217.0 lb

## 2013-07-17 DIAGNOSIS — J320 Chronic maxillary sinusitis: Secondary | ICD-10-CM

## 2013-07-17 DIAGNOSIS — J339 Nasal polyp, unspecified: Secondary | ICD-10-CM

## 2013-07-17 DIAGNOSIS — J45909 Unspecified asthma, uncomplicated: Secondary | ICD-10-CM

## 2013-07-17 DIAGNOSIS — J45998 Other asthma: Secondary | ICD-10-CM

## 2013-07-17 MED ORDER — PREDNISONE 10 MG PO TABS: ORAL_TABLET | ORAL | Status: DC

## 2013-07-17 MED ORDER — AMOXICILLIN-POT CLAVULANATE 875-125 MG PO TABS: 1.0000 | ORAL_TABLET | Freq: Two times a day (BID) | ORAL | Status: AC

## 2013-07-17 NOTE — Progress Notes (Signed)
Patient ID: Calvin [name], male   DOB: 1966-02-16, 47 y.o.   MRN: [id number]  12/15/11- 47 yoM never smoker referred by Dr [name], PCP @ [facility]. Patient is concerned with recurrent sinusitis and dyspnea., Thjis started while living in an old house that had been closed up, in February, 2011. Has gotten temporary relief every 2-3 months with a round of Zpak/Symbicort. By Sept, 2012, back to Dr [name] who sent him to pulmonologist at [facility]. Got PFT read as mild obstructive disease, though I would read it as normal.  He feels tightness in nose and throat. Denies fever, nodes, ear ache. Nasonex helps some when he can get it into nose. Clears briefly w/ hot shower. Coughs productively at times- blackish gray to pinkish in color.  Had prolonged sinusitis in his 20's- prolonged antibiotics, no ENT surgery. Never considered himself an allergic person.  Hx gutate psoriasis.  Declines flu vaccine. 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  01/17/12- 47 yoM never smoker referred by Dr [name], PCP @ [facility]. Patient is concerned with recurrent sinusitis and dyspnea. Doing better since last visit. He notices less head congestion and more postnasal drip. Cough is no longer productive but occasionally still will cough up scant purulent sputum. Nasal discharge is yellow brown. Regular use of saline rinse does help. He had taken Augmentin for 10 days and has now been off of it for 3 weeks.  Lab -12/19/ 2012: IgE 81.6, sedimentation rate 9, eosinophils 22.2%. CT sinus 12/17/11 IMPRESSION:  Pansinus inflammatory changes including fluid levels compatible  with acute sinusitis. Prior maxillary antrostomies suspected.  Sinonasal polyposis not excluded.  Original Report Authenticated By: Harley Hallmark, M.D.   02/21/12- 47 yoM never smoker referred by Dr [name], PCP @ [facility]. Patient is concerned with recurrent sinusitis and dyspnea He took Augmentin for 10 days in December and  again in January. Was doing much better. Still blowing some bloody mucus after rinsing with Nettie pot. Allergy profile 01/17/2012-negative with total IgE 110.8 Admits occasional wheeze. Exercising will sometimes trigger wheeze. He continues Dulera.  03/21/12- 47 yoM never smoker referred by Dr [name], PCP @ [facility]. Patient is concerned with recurrent sinusitis and dyspnea Neti Pot saline rinse clears his head.  He has noticed increased chest tightness and wheeze since last here. Had taken 2 weeks of Biaxin for the sinusitis. Now has no headache , much less bloody nasal discharge with occasional bright yellow still.  Needs refill Dulera 100. He had used Singulair in the past.  06/26/12- 47 yoM never smoker referred by Dr [name], PCP @ [facility]. Patient is concerned with recurrent sinusitis and dyspnea About a week ago had wheezing, increased cough and congestion-productive-dark green to black in color; since then symptoms have stopped for the most part. Still having nasal congestion and slight wheeze at times. He was sick in June with head congestion and chest rattle which were hard to clear. Sputum was very dark. Now has only mild wheeze lying supine with scant clear to trace green mucus and mild nasal congestion, right greater than left. He failed Singulair. He can never miss a dose of Dulera 200 without t feeling some chest tightness. Continues Nasonex. Allergy profile-01/17/2012-negative for specific elevations but total IgE 110.8. Since last here he was diagnosed with guttate psoriasis. CT max facial 12/20/11-reviewed with him IMPRESSION:  Pansinus inflammatory changes including fluid levels compatible  with acute sinusitis. Prior maxillary antrostomies suspected.  Sinonasal polyposis not excluded.  Original Report  Authenticated By: Harley Hallmark, M.D.    08/29/12- 47 yoM never smoker referred by Dr [name], PCP @ [facility]. Patient is concerned with recurrent sinusitis  and dyspnea  "Much improved" since last visit; Review PFT results with patient. He did well for several weeks but overnight as developed nasal congestion while lying supine. He began his saline rinses. Denies wheeze or chest tightness. 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 PFT: 07/05/2012: Mild obstructive airways disease, small airways with insignificant response to bronchodilator. Normal volumes and diffusion. FEV1 4.13/105%, FEV1/FVC 0.66, FEF 25-75% 2.18/57%  04/05/13- 47 yoM never smoker referred by Dr [name], PCP @ [facility]. Patient is concerned with recurrent sinusitis and dyspnea, Nasal polyp/not aspirin allergic, asthma FOLLOWS FOR:  pt. reports he is doing well.  Pt. denies any sob, wheezing or coughing. Pt. states breathing is better,  stays congested. He had a better winter. His nose currently is not block but is watery. Dulera controls his asthma and he notices if he misses a couple of doses.  07/17/13- 47 yoM never smoker referred by Dr [name], PCP @ [facility]. Patient is concerned with recurrent sinusitis and dyspnea, Nasal polyp/not aspirin allergic, asthma ACUTE VISIT: head congestion, PND, bilateral ear congestion, increased SOB, wheezing, chest tightness, prod cough with greenish-yellow mucus x3weeks - denies f/c/s.  Says he has had sinusitis for 3 weeks as described above. Ears hurt. Using Neti pot, hot shower, Mucinex. Does not think he has had a cold.  ROS-see HPI Constitutional:   No-. night sweats, fevers, chills, fatigue, lassitude. HEENT:   + headaches, difficulty swallowing, tooth/dental problems, sore throat,       No-sneezing, itching, ear ache,  +Variable nasal congestion, +post nasal drip,  CV:  No-   chest pain, orthopnea, PND, swelling in lower extremities, anasarca, dizziness, palpitations Resp: No-   shortness of breath with exertion or at rest.              No   productive cough,   non-productive  cough,  No- coughing up of blood.             No- change in color of mucus. No-wheezing.   Skin: No-   rash or lesions. GI:  No-   heartburn, indigestion, abdominal pain, nausea, vomiting,  GU: MS:  joint pain or swelling - hands.   Neuro-     nothing unusual Psych:  No- change in mood or affect. No depression or anxiety.  No memory loss.  OBJ General- Alert, Oriented, Affect-appropriate, Distress- none acute Skin- rash-none, lesions- none, excoriation- none Lymphadenopathy- none Head- atraumatic            Eyes- Gross vision intact, PERRLA, conjunctivae clear secretions            Ears- Hearing, canals-normal            Nose- +polyps , +thick clear mucus , pale/watery mucosa ,  no-Septal dev,  erosion, perforation             Throat- Mallampati II , mucosa red , drainage- none, tonsils- atrophic, Neck- flexible , trachea midline, no stridor , thyroid nl, carotid no bruit Chest - symmetrical excursion , unlabored           Heart/CV- RRR , no murmur , no gallop  , no rub, nl s1 s2                           -  JVD- none , edema- none, stasis changes- none, varices- none           Lung- no-wheezing, cough- none , dullness-none, rub- none           Chest wall-  Abd- Br/ Gen/ Rectal- Not done, not indicated Extrem- cyanosis- none, clubbing, none, atrophy- none, strength- nl Neuro- grossly intact to observation

## 2013-07-17 NOTE — Patient Instructions (Addendum)
Script sent for augmentin  Script printed for prednisone to take if you don't clear in a few days  Neb neo nasal  Depo 80  Continue with your Neti pot and nasal steroid spray

## 2013-07-18 MED ORDER — METHYLPREDNISOLONE ACETATE 80 MG/ML IJ SUSP
80.0000 mg | Freq: Once | INTRAMUSCULAR | Status: AC
  Administered 2013-07-18: 80 mg via INTRAMUSCULAR

## 2013-07-18 MED ORDER — PHENYLEPHRINE HCL 1 % NA SOLN
3.0000 [drp] | Freq: Once | NASAL | Status: AC
  Administered 2013-07-18: 3 [drp] via NASAL

## 2013-08-01 NOTE — Assessment & Plan Note (Signed)
Postobstructive sinusitis with nasal polyps. Plan-nasal nebulizer decongestant, Depo-Medrol, prednisone a day taper, Augmentin x10 days

## 2013-08-01 NOTE — Assessment & Plan Note (Signed)
Recurrent nasal polyps with exacerbation. Probable postobstructive sinusitis Plan-nebulizer nasal decongestant, Depo-Medrol, prednisone taper

## 2013-10-05 ENCOUNTER — Other Ambulatory Visit: Payer: Managed Care, Other (non HMO)

## 2013-10-05 ENCOUNTER — Ambulatory Visit (INDEPENDENT_AMBULATORY_CARE_PROVIDER_SITE_OTHER): Payer: Managed Care, Other (non HMO) | Admitting: Internal Medicine

## 2013-10-05 ENCOUNTER — Encounter: Payer: Self-pay | Admitting: Internal Medicine

## 2013-10-05 VITALS — BP 122/64 | HR 80 | Ht 73.0 in | Wt 222.0 lb

## 2013-10-05 DIAGNOSIS — D839 Common variable immunodeficiency, unspecified: Secondary | ICD-10-CM

## 2013-10-05 DIAGNOSIS — J329 Chronic sinusitis, unspecified: Secondary | ICD-10-CM

## 2013-10-05 DIAGNOSIS — J320 Chronic maxillary sinusitis: Secondary | ICD-10-CM

## 2013-10-05 DIAGNOSIS — J45998 Other asthma: Secondary | ICD-10-CM

## 2013-10-05 DIAGNOSIS — J45909 Unspecified asthma, uncomplicated: Secondary | ICD-10-CM

## 2013-10-05 DIAGNOSIS — J339 Nasal polyp, unspecified: Secondary | ICD-10-CM

## 2013-10-05 DIAGNOSIS — Z23 Encounter for immunization: Secondary | ICD-10-CM

## 2013-10-05 NOTE — Progress Notes (Signed)
Patient ID: Calvin [name], male   DOB: 20-Sep-1966, 47 y.o.   MRN: [id number]  12/15/11- 63 yoM never smoker referred by Dr [name], PCP @ [facility]. Patient is concerned with recurrent sinusitis and dyspnea., Thjis started while living in an old house that had been closed up, in February, 2011. Has gotten temporary relief every 2-3 months with a round of Zpak/Symbicort. By Sept, 2012, back to Dr [name] who sent him to pulmonologist at [facility]. Got PFT read as mild obstructive disease, though I would read it as normal.  He feels tightness in nose and throat. Denies fever, nodes, ear ache. Nasonex helps some when he can get it into nose. Clears briefly w/ hot shower. Coughs productively at times- blackish gray to pinkish in color.  Had prolonged sinusitis in his 20's- prolonged antibiotics, no ENT surgery. Never considered himself an allergic person.  Hx gutate psoriasis.  Declines flu vaccine. 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  01/17/12- 45 yoM never smoker referred by Dr [name], PCP @ [facility]. Patient is concerned with recurrent sinusitis and dyspnea. Doing better since last visit. He notices less head congestion and more postnasal drip. Cough is no longer productive but occasionally still will cough up scant purulent sputum. Nasal discharge is yellow brown. Regular use of saline rinse does help. He had taken Augmentin for 10 days and has now been off of it for 3 weeks.  Lab -12/19/ 2012: IgE 81.6, sedimentation rate 9, eosinophils 22.2%. CT sinus 12/17/11 IMPRESSION:  Pansinus inflammatory changes including fluid levels compatible  with acute sinusitis. Prior maxillary antrostomies suspected.  Sinonasal polyposis not excluded.  Original Report Authenticated By: Harley Hallmark, M.D.   02/21/12- 53 yoM never smoker referred by Dr [name], PCP @ [facility]. Patient is concerned with recurrent sinusitis and dyspnea He took Augmentin for 10 days in December and  again in January. Was doing much better. Still blowing some bloody mucus after rinsing with Nettie pot. Allergy profile 01/17/2012-negative with total IgE 110.8 Admits occasional wheeze. Exercising will sometimes trigger wheeze. He continues Dulera.  03/21/12- 45 yoM never smoker referred by Dr [name], PCP @ [facility]. Patient is concerned with recurrent sinusitis and dyspnea Neti Pot saline rinse clears his head.  He has noticed increased chest tightness and wheeze since last here. Had taken 2 weeks of Biaxin for the sinusitis. Now has no headache , much less bloody nasal discharge with occasional bright yellow still.  Needs refill Dulera 100. He had used Singulair in the past.  06/26/12- 45 yoM never smoker referred by Dr [name], PCP @ [facility]. Patient is concerned with recurrent sinusitis and dyspnea About a week ago had wheezing, increased cough and congestion-productive-dark green to black in color; since then symptoms have stopped for the most part. Still having nasal congestion and slight wheeze at times. He was sick in June with head congestion and chest rattle which were hard to clear. Sputum was very dark. Now has only mild wheeze lying supine with scant clear to trace green mucus and mild nasal congestion, right greater than left. He failed Singulair. He can never miss a dose of Dulera 200 without t feeling some chest tightness. Continues Nasonex. Allergy profile-01/17/2012-negative for specific elevations but total IgE 110.8. Since last here he was diagnosed with guttate psoriasis. CT max facial 12/20/11-reviewed with him IMPRESSION:  Pansinus inflammatory changes including fluid levels compatible  with acute sinusitis. Prior maxillary antrostomies suspected.  Sinonasal polyposis not excluded.  Original Report  Authenticated By: Harley Hallmark, M.D.    08/29/12- 65 yoM never smoker referred by Dr [name], PCP @ [facility]. Patient is concerned with recurrent sinusitis  and dyspnea  "Much improved" since last visit; Review PFT results with patient. He did well for several weeks but overnight as developed nasal congestion while lying supine. He began his saline rinses. Denies wheeze or chest tightness. 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 PFT: 07/05/2012: Mild obstructive airways disease, small airways with insignificant response to bronchodilator. Normal volumes and diffusion. FEV1 4.13/105%, FEV1/FVC 0.66, FEF 25-75% 2.18/57%  04/05/13- 46 yoM never smoker referred by Dr [name], PCP @ [facility]. Patient is concerned with recurrent sinusitis and dyspnea, Nasal polyp/not aspirin allergic, asthma FOLLOWS FOR:  pt. reports he is doing well.  Pt. denies any sob, wheezing or coughing. Pt. states breathing is better,  stays congested. He had a better winter. His nose currently is not block but is watery. Dulera controls his asthma and he notices if he misses a couple of doses.  07/17/13- 10 yoM never smoker referred by Dr [name], PCP @ [facility]. Patient is concerned with recurrent sinusitis and dyspnea, Nasal polyp/not aspirin allergic, asthma ACUTE VISIT: head congestion, PND, bilateral ear congestion, increased SOB, wheezing, chest tightness, prod cough with greenish-yellow mucus x3weeks - denies f/c/s.  Says he has had sinusitis for 3 weeks as described above. Ears hurt. Using Neti pot, hot shower, Mucinex. Does not think he has had a cold.  10/05/13- 74 yoM never smoker referred by Dr [name], PCP @ [facility]. Patient is concerned with recurrent sinusitis and dyspnea, Nasal polyp/ not aspirin allergic, asthma FOLLOWS FOR: continues to have head congestion. Feels better from last visit  Feels well today, recognizing minor sense of head congestion. Continues using Neti pot. Chronic anosmia. Dulera controls his asthma, but needs "every dose". Rare need for rescue inhaler. Imunoglobulins  06/26/12- IgG  596 (L)   IgA 68 - 379 mg/dL  409 (H)   IgM, Serum 41 - 251 mg/dL  29 (L)   Resulting Agency [facility]    ROS-see HPI Constitutional:   No-. night sweats, fevers, chills, fatigue, lassitude. HEENT:   + headaches, difficulty swallowing, tooth/dental problems, sore throat,       No-sneezing, itching, ear ache,  +Variable nasal congestion, +post nasal drip,  CV:  No-   chest pain, orthopnea, PND, swelling in lower extremities, anasarca, dizziness, palpitations Resp: No-   shortness of breath with exertion or at rest.              No   productive cough,   non-productive cough,  No- coughing up of blood.             No- change in color of mucus. No-wheezing.   Skin: No-   rash or lesions. GI:  No-   heartburn, indigestion, abdominal pain, nausea, vomiting,  GU: MS:  joint pain or swelling - hands.   Neuro-     nothing unusual Psych:  No- change in mood or affect. No depression or anxiety.  No memory loss.  OBJ General- Alert, Oriented, Affect-appropriate, Distress- none acute Skin- rash-none, lesions- none, excoriation- none Lymphadenopathy- none Head- atraumatic            Eyes- Gross vision intact, PERRLA, conjunctivae clear secretions            Ears- Hearing, canals-normal  Nose- no-polyps seen , +scant clear mucus , pale/watery mucosa ,  no-Septal dev, erosion,                             or perforation             Throat- Mallampati II , mucosa red , drainage- none, tonsils- atrophic, Neck- flexible , trachea midline, no stridor , thyroid nl, carotid no bruit Chest - symmetrical excursion , unlabored           Heart/CV- RRR , no murmur , no gallop  , no rub, nl s1 s2                           - JVD- none , edema- none, stasis changes- none, varices- none           Lung- no-wheezing, cough- none , dullness-none, rub- none           Chest wall-  Abd- Br/ Gen/ Rectal- Not done, not indicated Extrem- cyanosis- none, clubbing, none, atrophy- none, strength-  nl Neuro- grossly intact to observation

## 2013-10-05 NOTE — Patient Instructions (Signed)
Flu vax  Pneumovax 23  Order lab- pneumococcal antibodies   Dx Asthma, Nasal polyps, chronic sinusitis  Please call as needed

## 2013-10-18 NOTE — Progress Notes (Signed)
Quick Note:  Advised pt of lab results per CY. Pt verbalized understanding and will keep f/u appt ______

## 2013-10-21 ENCOUNTER — Encounter: Payer: Self-pay | Admitting: Internal Medicine

## 2013-10-21 DIAGNOSIS — D839 Common variable immunodeficiency, unspecified: Secondary | ICD-10-CM | POA: Insufficient documentation

## 2013-10-21 NOTE — Assessment & Plan Note (Signed)
Better control currently, continue Neti pot Plan-pneumonia vaccine saccharide 23

## 2013-10-21 NOTE — Assessment & Plan Note (Addendum)
We discussed possibility of IVIG and chose to wait for now Plan-check pneumococcal antibodies after Prevnar

## 2013-10-21 NOTE — Assessment & Plan Note (Signed)
Good control Plan flu vaccine

## 2014-02-05 ENCOUNTER — Ambulatory Visit: Payer: Managed Care, Other (non HMO) | Admitting: Internal Medicine

## 2014-03-23 DIAGNOSIS — I214 Non-ST elevation (NSTEMI) myocardial infarction: Secondary | ICD-10-CM

## 2014-03-23 HISTORY — DX: Non-ST elevation (NSTEMI) myocardial infarction: I21.4

## 2014-03-25 ENCOUNTER — Encounter (HOSPITAL_COMMUNITY)
Admission: EM | Disposition: A | Discharge status: Home / Self Care | Payer: Self-pay | Source: Home / Self Care | Attending: Internal Medicine

## 2014-03-25 ENCOUNTER — Inpatient Hospital Stay (HOSPITAL_COMMUNITY)
Admission: EM | Admit: 2014-03-25 | Discharge: 2014-03-29 | DRG: 247 | Disposition: A | Discharge status: Home / Self Care | Payer: Managed Care, Other (non HMO) | Attending: Internal Medicine | Admitting: Internal Medicine

## 2014-03-25 ENCOUNTER — Emergency Department (HOSPITAL_COMMUNITY): Payer: Managed Care, Other (non HMO)

## 2014-03-25 ENCOUNTER — Encounter (HOSPITAL_COMMUNITY): Payer: Self-pay | Admitting: Emergency Medicine

## 2014-03-25 DIAGNOSIS — I059 Rheumatic mitral valve disease, unspecified: Secondary | ICD-10-CM | POA: Diagnosis present

## 2014-03-25 DIAGNOSIS — I517 Cardiomegaly: Secondary | ICD-10-CM

## 2014-03-25 DIAGNOSIS — K5289 Other specified noninfective gastroenteritis and colitis: Secondary | ICD-10-CM | POA: Diagnosis not present

## 2014-03-25 DIAGNOSIS — E785 Hyperlipidemia, unspecified: Secondary | ICD-10-CM | POA: Diagnosis present

## 2014-03-25 DIAGNOSIS — I214 Non-ST elevation (NSTEMI) myocardial infarction: Principal | ICD-10-CM | POA: Diagnosis present

## 2014-03-25 DIAGNOSIS — Z888 Allergy status to other drugs, medicaments and biological substances status: Secondary | ICD-10-CM

## 2014-03-25 DIAGNOSIS — J45909 Unspecified asthma, uncomplicated: Secondary | ICD-10-CM | POA: Diagnosis present

## 2014-03-25 DIAGNOSIS — I2510 Atherosclerotic heart disease of native coronary artery without angina pectoris: Secondary | ICD-10-CM | POA: Diagnosis present

## 2014-03-25 DIAGNOSIS — D62 Acute posthemorrhagic anemia: Secondary | ICD-10-CM | POA: Diagnosis not present

## 2014-03-25 DIAGNOSIS — L408 Other psoriasis: Secondary | ICD-10-CM | POA: Diagnosis present

## 2014-03-25 DIAGNOSIS — K5900 Constipation, unspecified: Secondary | ICD-10-CM | POA: Diagnosis present

## 2014-03-25 DIAGNOSIS — Z8249 Family history of ischemic heart disease and other diseases of the circulatory system: Secondary | ICD-10-CM

## 2014-03-25 DIAGNOSIS — Z825 Family history of asthma and other chronic lower respiratory diseases: Secondary | ICD-10-CM

## 2014-03-25 DIAGNOSIS — J019 Acute sinusitis, unspecified: Secondary | ICD-10-CM | POA: Diagnosis not present

## 2014-03-25 DIAGNOSIS — Z8042 Family history of malignant neoplasm of prostate: Secondary | ICD-10-CM

## 2014-03-25 DIAGNOSIS — K922 Gastrointestinal hemorrhage, unspecified: Secondary | ICD-10-CM

## 2014-03-25 DIAGNOSIS — I249 Acute ischemic heart disease, unspecified: Secondary | ICD-10-CM

## 2014-03-25 DIAGNOSIS — I200 Unstable angina: Secondary | ICD-10-CM

## 2014-03-25 DIAGNOSIS — Z800 Family history of malignant neoplasm of digestive organs: Secondary | ICD-10-CM

## 2014-03-25 HISTORY — DX: Gastrointestinal hemorrhage, unspecified: K92.2

## 2014-03-25 HISTORY — DX: Non-ST elevation (NSTEMI) myocardial infarction: I21.4

## 2014-03-25 HISTORY — DX: Acute ischemic heart disease, unspecified: I24.9

## 2014-03-25 HISTORY — DX: Acute sinusitis, unspecified: J01.90

## 2014-03-25 HISTORY — DX: Hyperlipidemia, unspecified: E78.5

## 2014-03-25 HISTORY — DX: Atherosclerotic heart disease of native coronary artery without angina pectoris: I25.10

## 2014-03-25 LAB — LIPID PANEL
Cholesterol: 154 mg/dL (ref 0–200)
HDL: 38 mg/dL — AB (ref >39)
LDL Cholesterol: 93 mg/dL (ref 0–99)
TRIGLYCERIDES: 115 mg/dL (ref <150)
Total CHOL/HDL Ratio: 4.1 RATIO
VLDL: 23 mg/dL (ref 0–40)

## 2014-03-25 LAB — BASIC METABOLIC PANEL
BUN: 14 mg/dL (ref 6–23)
CO2: 21 meq/L (ref 19–32)
Calcium: 9.3 mg/dL (ref 8.4–10.5)
Chloride: 103 mEq/L (ref 96–112)
Creatinine, Ser: 1.04 mg/dL (ref 0.50–1.35)
GFR calc Af Amer: >90 mL/min (ref >90)
GFR calc non Af Amer: 83 mL/min — ABNORMAL LOW (ref >90)
GLUCOSE: 106 mg/dL — AB (ref 70–99)
POTASSIUM: 3.8 meq/L (ref 3.7–5.3)
SODIUM: 139 meq/L (ref 137–147)

## 2014-03-25 LAB — CBC
HCT: 39 % (ref 39.0–52.0)
HEMOGLOBIN: 13.6 g/dL (ref 13.0–17.0)
MCH: 30.4 pg (ref 26.0–34.0)
MCHC: 34.9 g/dL (ref 30.0–36.0)
MCV: 87.1 fL (ref 78.0–100.0)
PLATELETS: 403 10*3/uL — AB (ref 150–400)
RBC: 4.48 MIL/uL (ref 4.22–5.81)
RDW: 13.1 % (ref 11.5–15.5)
WBC: 9.5 10*3/uL (ref 4.0–10.5)

## 2014-03-25 LAB — TROPONIN I
TROPONIN I: 6.51 ng/mL — AB (ref <0.30)
TROPONIN I: 3.57 ng/mL — AB (ref <0.30)
Troponin I: 8.2 ng/mL (ref <0.30)
Troponin I: 2.13 ng/mL (ref <0.30)

## 2014-03-25 LAB — PRO B NATRIURETIC PEPTIDE: PRO B NATRI PEPTIDE: 111.2 pg/mL (ref 0–125)

## 2014-03-25 LAB — HEMOGLOBIN A1C
Hgb A1c MFr Bld: 6.1 % — ABNORMAL HIGH (ref <5.7)
Mean Plasma Glucose: 128 mg/dL — ABNORMAL HIGH (ref <117)

## 2014-03-25 LAB — HEPARIN LEVEL (UNFRACTIONATED): HEPARIN UNFRACTIONATED: 0.24 [IU]/mL — AB (ref 0.30–0.70)

## 2014-03-25 SURGERY — LEFT HEART CATHETERIZATION WITH CORONARY ANGIOGRAM
Anesthesia: LOCAL

## 2014-03-25 MED ORDER — VITAMIN C 250 MG PO TABS
250.0000 mg | ORAL_TABLET | Freq: Every day | ORAL | Status: DC
  Administered 2014-03-27 – 2014-03-29 (×3): 250 mg via ORAL
  Filled 2014-03-25 (×5): qty 1

## 2014-03-25 MED ORDER — SODIUM CHLORIDE 0.9 % IV SOLN
INTRAVENOUS | Status: DC
  Administered 2014-03-26: 04:00:00 via INTRAVENOUS

## 2014-03-25 MED ORDER — ALBUTEROL SULFATE HFA 108 (90 BASE) MCG/ACT IN AERS: 2.0000 | INHALATION_SPRAY | Freq: Four times a day (QID) | RESPIRATORY_TRACT | Status: DC | PRN

## 2014-03-25 MED ORDER — ASPIRIN 300 MG RE SUPP
300.0000 mg | RECTAL | Status: AC
  Filled 2014-03-25: qty 1

## 2014-03-25 MED ORDER — HEPARIN SODIUM (PORCINE) 5000 UNIT/ML IJ SOLN
4000.0000 [IU] | INTRAMUSCULAR | Status: AC
  Administered 2014-03-25: 4000 [IU] via INTRAVENOUS
  Filled 2014-03-25: qty 1

## 2014-03-25 MED ORDER — ADULT MULTIVITAMIN W/MINERALS CH
1.0000 | ORAL_TABLET | Freq: Every day | ORAL | Status: DC
  Administered 2014-03-27 – 2014-03-29 (×3): 1 via ORAL
  Filled 2014-03-25 (×5): qty 1

## 2014-03-25 MED ORDER — MOMETASONE FURO-FORMOTEROL FUM 200-5 MCG/ACT IN AERO
2.0000 | INHALATION_SPRAY | Freq: Two times a day (BID) | RESPIRATORY_TRACT | Status: DC
  Administered 2014-03-25 – 2014-03-29 (×8): 2 via RESPIRATORY_TRACT
  Filled 2014-03-25 (×3): qty 8.8

## 2014-03-25 MED ORDER — TICAGRELOR 90 MG PO TABS
180.0000 mg | ORAL_TABLET | Freq: Once | ORAL | Status: AC
  Administered 2014-03-25: 180 mg via ORAL
  Filled 2014-03-25: qty 2

## 2014-03-25 MED ORDER — ALBUTEROL SULFATE (2.5 MG/3ML) 0.083% IN NEBU: 2.5000 mg | INHALATION_SOLUTION | Freq: Four times a day (QID) | RESPIRATORY_TRACT | Status: DC | PRN

## 2014-03-25 MED ORDER — SODIUM CHLORIDE 0.9 % IV SOLN: 250.0000 mL | INTRAVENOUS | Status: DC | PRN

## 2014-03-25 MED ORDER — FLUTICASONE PROPIONATE 50 MCG/ACT NA SUSP
1.0000 | Freq: Two times a day (BID) | NASAL | Status: DC
  Administered 2014-03-25: 1 via NASAL
  Filled 2014-03-25: qty 16

## 2014-03-25 MED ORDER — HEPARIN (PORCINE) IN NACL 100-0.45 UNIT/ML-% IJ SOLN
1900.0000 [IU]/h | INTRAMUSCULAR | Status: DC
  Administered 2014-03-25: 1700 [IU]/h via INTRAVENOUS
  Administered 2014-03-25: 1400 [IU]/h via INTRAVENOUS
  Filled 2014-03-25 (×5): qty 250

## 2014-03-25 MED ORDER — ASPIRIN 81 MG PO CHEW
81.0000 mg | CHEWABLE_TABLET | ORAL | Status: AC
  Administered 2014-03-26: 81 mg via ORAL
  Filled 2014-03-25: qty 1

## 2014-03-25 MED ORDER — TICAGRELOR 90 MG PO TABS
ORAL_TABLET | ORAL | Status: AC
  Filled 2014-03-25: qty 2

## 2014-03-25 MED ORDER — ASPIRIN 81 MG PO CHEW
324.0000 mg | CHEWABLE_TABLET | ORAL | Status: AC
  Administered 2014-03-25: 324 mg via ORAL
  Filled 2014-03-25: qty 4

## 2014-03-25 MED ORDER — NITROGLYCERIN 0.4 MG SL SUBL: 0.4000 mg | SUBLINGUAL_TABLET | SUBLINGUAL | Status: DC | PRN

## 2014-03-25 MED ORDER — HEPARIN BOLUS VIA INFUSION
2500.0000 [IU] | Freq: Once | INTRAVENOUS | Status: AC
  Administered 2014-03-25: 2500 [IU] via INTRAVENOUS
  Filled 2014-03-25: qty 2500

## 2014-03-25 MED ORDER — LOPERAMIDE HCL 2 MG PO CAPS
2.0000 mg | ORAL_CAPSULE | ORAL | Status: DC | PRN
  Administered 2014-03-25: 2 mg via ORAL
  Filled 2014-03-25: qty 1

## 2014-03-25 MED ORDER — SODIUM CHLORIDE 0.9 % IJ SOLN: 3.0000 mL | INTRAMUSCULAR | Status: DC | PRN

## 2014-03-25 MED ORDER — OMEGA-3-ACID ETHYL ESTERS 1 G PO CAPS
1.0000 g | ORAL_CAPSULE | Freq: Every day | ORAL | Status: DC
  Administered 2014-03-27 – 2014-03-29 (×3): 1 g via ORAL
  Filled 2014-03-25 (×5): qty 1

## 2014-03-25 MED ORDER — HEPARIN BOLUS VIA INFUSION
4000.0000 [IU] | Freq: Once | INTRAVENOUS | Status: DC
  Filled 2014-03-25: qty 4000

## 2014-03-25 MED ORDER — SODIUM CHLORIDE 0.9 % IJ SOLN: 3.0000 mL | Freq: Two times a day (BID) | INTRAMUSCULAR | Status: DC

## 2014-03-25 MED ORDER — ASPIRIN EC 81 MG PO TBEC: 81.0000 mg | DELAYED_RELEASE_TABLET | Freq: Every day | ORAL | Status: DC

## 2014-03-25 MED ORDER — ATORVASTATIN CALCIUM 40 MG PO TABS
40.0000 mg | ORAL_TABLET | Freq: Every day | ORAL | Status: DC
  Administered 2014-03-25 – 2014-03-28 (×4): 40 mg via ORAL
  Filled 2014-03-25 (×6): qty 1

## 2014-03-25 MED ORDER — METOPROLOL TARTRATE 12.5 MG HALF TABLET
12.5000 mg | ORAL_TABLET | Freq: Two times a day (BID) | ORAL | Status: DC
  Administered 2014-03-25 (×2): 12.5 mg via ORAL
  Filled 2014-03-25 (×4): qty 1

## 2014-03-25 NOTE — ED Notes (Signed)
The pt is c/o lt upper chest pain and lt arm pain intermittently since yesterday .  He reported that he blacked out in the shower yesterday and was sob.  Chest pain in the past no interest by the pt

## 2014-03-25 NOTE — ED Notes (Signed)
Reported Troponin 8.2 to Dr. Hyacinth MeekerMiller.

## 2014-03-25 NOTE — Progress Notes (Signed)
ANTICOAGULATION CONSULT NOTE - Initial Consult  Pharmacy Consult for heparin Indication: chest pain/ACS  Allergies  Allergen Reactions  . Daliresp [Roflumilast]     GI UPSET    Patient Measurements: Heparin Dosing Weight: 100kg  Vital Signs: Temp: 98.3 F (36.8 C) (03/30 0235) Temp src: Oral (03/30 0235) BP: 129/73 mmHg (03/30 0615) Pulse Rate: 95 (03/30 0615)  Labs:  Recent Labs  03/25/14 0239  HGB 13.6  HCT 39.0  PLT 403*  CREATININE 1.04  TROPONINI 8.20*    Medical History: Past Medical History  Diagnosis Date  . Hyperlipemia   . Psoriasis   . MVP (mitral valve prolapse)     Dr Lady GaryFath  . Rhinitis   . Asthmatic bronchitis     Medications:  Prescriptions prior to admission  Medication Sig Dispense Refill  . Ascorbic Acid (VITAMIN C PO) Take 1 tablet by mouth daily.      . fenofibrate 160 MG tablet Take 1 tablet by mouth daily.      . fluticasone (FLONASE) 50 MCG/ACT nasal spray Place 1 spray into both nostrils 2 (two) times daily.      . Misc Natural Products (GLUCOS-CHONDROIT-MSM COMPLEX) TABS Take 1 tablet by mouth daily.      . mometasone-formoterol (DULERA) 200-5 MCG/ACT AERO Inhale 2 puffs into the lungs 2 (two) times daily.      . Multiple Vitamin (MULTIVITAMIN WITH MINERALS) TABS tablet Take 1 tablet by mouth daily.      Marland Kitchen. omega-3 acid ethyl esters (LOVAZA) 1 G capsule Take 1 g by mouth daily.      Marland Kitchen. albuterol (PROAIR HFA) 108 (90 BASE) MCG/ACT inhaler Inhale 2 puffs into the lungs every 6 (six) hours as needed for wheezing or shortness of breath.  1 Inhaler  prn   Scheduled:  . aspirin  324 mg Oral NOW   Or  . aspirin  300 mg Rectal NOW  . [START ON 03/26/2014] aspirin EC  81 mg Oral Daily  . atorvastatin  40 mg Oral q1800  . fluticasone  1 spray Each Nare BID  . metoprolol tartrate  12.5 mg Oral BID  . mometasone-formoterol  2 puff Inhalation BID  . multivitamin with minerals  1 tablet Oral Daily  . omega-3 acid ethyl esters  1 g Oral Daily  .  vitamin C  250 mg Oral Daily    Assessment: 48yo male c/o sharp substernal CP radiating to left arm and jaw with near syncope, initial troponin elevated, to begin heparin.  Goal of Therapy:  Heparin level 0.3-0.7 units/ml Monitor platelets by anticoagulation protocol: Yes   Plan:  Rec'd heparin 4000 units in ED; will continue with heparin gtt at 1400 units/hr and monitor heparin levels and CBC.  Vernard GamblesVeronda Avis Mcmahill, PharmD, BCPS  03/25/2014,6:56 AM

## 2014-03-25 NOTE — ED Notes (Signed)
Patient complains of feeling dizzy when he lays down.

## 2014-03-25 NOTE — Progress Notes (Signed)
  Echocardiogram 2D Echocardiogram has been performed.  Arvil ChacoFoster, Zuria Fosdick 03/25/2014, 12:50 PM

## 2014-03-25 NOTE — Care Management Note (Signed)
    Page 1 of 1   03/27/2014     10:31:31 AM   CARE MANAGEMENT NOTE 03/27/2014  Patient:  Calvin [name],Calvin [name]   Account Number:  [id number]  Date Initiated:  03/25/2014  Documentation initiated by:  AMERSON,JULIE  Subjective/Objective Assessment:   PT ADM ON 3/30 WITH NSTEMI/ACS.  PTA, PT INDEPENDENT, LIVES WITH SPOUSE.     Action/Plan:   WILL FOLLOW FOR DC NEEDS AS PT PROGRESSES.//Benefits check for Brilinta   Anticipated DC Date:  03/28/2014   Anticipated DC Plan:  HOME/SELF CARE      DC Planning Services  CM consult      Choice offered to / List presented to:             Status of service:  In process, will continue to follow Medicare Important Message given?   (If response is "NO", the following Medicare IM given date fields will be blank) Date Medicare IM given:   Date Additional Medicare IM given:    Discharge Disposition:    Per UR Regulation:  Reviewed for med. necessity/level of care/duration of stay  If discussed at Long Length of Stay Meetings, dates discussed:    Comments:  03/27/14 0850 Oletta Cohnamellia Lakethia Coppess, RN, BSN, NCM [phone] Benefits check for Brilinta cancelled due to medication switch to Plavix per Dr Katrinka BlazingSmith since bleeding tendency will be less.

## 2014-03-25 NOTE — Progress Notes (Signed)
Pt on for heart cath tomorrow. Will resume diet. Discussed this with patient. He understands.

## 2014-03-25 NOTE — ED Notes (Signed)
Dr. Marijean NiemannYousif (Cardiology) at the bedside.

## 2014-03-25 NOTE — ED Notes (Signed)
Troponin 8.20 called in by lab.

## 2014-03-25 NOTE — H&P (View-Only) (Signed)
SUBJECTIVE:  No further chest pain since admission.  Ruled in.     PHYSICAL EXAM Filed Vitals:   03/25/14 0600 03/25/14 0615 03/25/14 0658 03/25/14 0939  BP: 128/68 129/73 154/74   Pulse: 97 95 95   Temp:   97.7 F (36.5 C)   TempSrc:   Oral   Resp: 18 27 20    SpO2: 95% 95% 100% 94%   General:  No distress Lungs:  Clear Heart:  RRR Abdomen:  Positive bowel sounds, no rebound no guarding Extremities:  No edema  LABS: Lab Results  Component Value Date   TROPONINI 6.51* 03/25/2014   Results for orders placed during the hospital encounter of 03/25/14 (from the past 24 hour(s))  CBC     Status: Abnormal   Collection Time    03/25/14  2:39 AM      Result Value Ref Range   WBC 9.5  4.0 - 10.5 K/uL   RBC 4.48  4.22 - 5.81 MIL/uL   Hemoglobin 13.6  13.0 - 17.0 g/dL   HCT 40.9  81.1 - 91.4 %   MCV 87.1  78.0 - 100.0 fL   MCH 30.4  26.0 - 34.0 pg   MCHC 34.9  30.0 - 36.0 g/dL   RDW 78.2  95.6 - 21.3 %   Platelets 403 (*) 150 - 400 K/uL  BASIC METABOLIC PANEL     Status: Abnormal   Collection Time    03/25/14  2:39 AM      Result Value Ref Range   Sodium 139  137 - 147 mEq/L   Potassium 3.8  3.7 - 5.3 mEq/L   Chloride 103  96 - 112 mEq/L   CO2 21  19 - 32 mEq/L   Glucose, Bld 106 (*) 70 - 99 mg/dL   BUN 14  6 - 23 mg/dL   Creatinine, Ser 0.86  0.50 - 1.35 mg/dL   Calcium 9.3  8.4 - 57.8 mg/dL   GFR calc non Af Amer 83 (*) >90 mL/min   GFR calc Af Amer >90  >90 mL/min  PRO B NATRIURETIC PEPTIDE     Status: None   Collection Time    03/25/14  2:39 AM      Result Value Ref Range   Pro B Natriuretic peptide (BNP) 111.2  0 - 125 pg/mL  TROPONIN I     Status: Abnormal   Collection Time    03/25/14  2:39 AM      Result Value Ref Range   Troponin I 8.20 (*) <0.30 ng/mL  TROPONIN I     Status: Abnormal   Collection Time    03/25/14  8:27 AM      Result Value Ref Range   Troponin I 6.51 (*) <0.30 ng/mL  LIPID PANEL     Status: Abnormal   Collection Time    03/25/14   8:27 AM      Result Value Ref Range   Cholesterol 154  0 - 200 mg/dL   Triglycerides 469  <629 mg/dL   HDL 38 (*) >52 mg/dL   Total CHOL/HDL Ratio 4.1     VLDL 23  0 - 40 mg/dL   LDL Cholesterol 93  0 - 99 mg/dL   No intake or output data in the 24 hours ending 03/25/14 1054  EKG:   Pending this AM.   ASSESSMENT AND PLAN:   NQWMI:  Ruled in.  No further pain.  Plan cath today.  The patient understands  that risks included but are not limited to stroke (1 in 1000), death (1 in 1000), kidney failure [usually temporary] (1 in 500), bleeding (1 in 200), allergic reaction [possibly serious] (1 in 200).  The patient understands and agrees to proceed.   Repeat EKG.  Check echo.    Risk reduction:  Not many risk factors. He was told that his mother died at age 49 with heart problems but he knows nothing mor about this.  Started on statin.  Continue this.   Fayrene FearingJames Prg [facility] 03/25/2014 10:54 AM

## 2014-03-25 NOTE — ED Provider Notes (Signed)
CSN: [id number]     Arrival date & time 03/25/14  0228 History   First MD Initiated Contact with Patient 03/25/14 [phone]     Chief Complaint  Patient presents with  . Chest Pain     (Consider location/radiation/quality/duration/timing/severity/associated sxs/prior Treatment) HPI Comments: 48 year old male, history of hyperlipidemia but no other significant risk factors for heart disease who presents with a complaint of chest pain. This was located in the left lower chest, associated with mild left arm pain, has been intermittent since yesterday. He notes that he was in the shower last evening when he became lightheaded and near syncopal. He sat down the edge, was able to make his way to the commode where he sat down for several minutes before his symptoms resolved and went away. The chest pain started many hours after that. He does have associated intermittent shortness of breath. At this time he is symptom-free. He exercises frequently on a treadmill in fact one week ago he was able to walk for 45 minutes with significant exertion without any symptoms whatsoever and never has any exertional symptoms. He does not smoke cigarettes, has never had trouble with activity, has not had any swelling of his lower extremities. He does note approximately 7-10 days of vague illness including sinus drainage, sinus pressure, mild cough, generalized weakness in 2 or 3 days of diarrhea which has resolved.  Patient is a 48 y.o. male presenting with chest pain. The history is provided by the patient and a relative.  Chest Pain   Past Medical History  Diagnosis Date  . Hyperlipemia   . Psoriasis   . MVP (mitral valve prolapse)     Dr [name]  . Rhinitis   . Asthmatic bronchitis    Past Surgical History  Procedure Laterality Date  . None     Family History  Problem Relation Age of Onset  . Emphysema Maternal Grandmother     non smoker  . Asthma Maternal Grandmother   . Prostate cancer Paternal Grandfather    . Heart disease Mother     deceased age 22  . Heart disease Maternal Uncle     valve replaced; deceased age 57   History  Substance Use Topics  . Smoking status: Never Smoker   . Smokeless tobacco: Not on file  . Alcohol Use: Yes     Comment: occasional beer    Review of Systems  Cardiovascular: Positive for chest pain.  All other systems reviewed and are negative.      Allergies  Daliresp  Home Medications   Current Outpatient Rx  Name  Route  Sig  Dispense  Refill  . Ascorbic Acid (VITAMIN C PO)   Oral   Take 1 tablet by mouth daily.         . fenofibrate 160 MG tablet   Oral   Take 1 tablet by mouth daily.         . fluticasone (FLONASE) 50 MCG/ACT nasal spray   Each Nare   Place 1 spray into both nostrils 2 (two) times daily.         . Misc Natural Products (GLUCOS-CHONDROIT-MSM COMPLEX) TABS   Oral   Take 1 tablet by mouth daily.         . mometasone-formoterol (DULERA) 200-5 MCG/ACT AERO   Inhalation   Inhale 2 puffs into the lungs 2 (two) times daily.         . Multiple Vitamin (MULTIVITAMIN WITH MINERALS) TABS tablet   Oral  Take 1 tablet by mouth daily.         Marland Kitchen omega-3 acid ethyl esters (LOVAZA) 1 G capsule   Oral   Take 1 g by mouth daily.         Marland Kitchen albuterol (PROAIR HFA) 108 (90 BASE) MCG/ACT inhaler   Inhalation   Inhale 2 puffs into the lungs every 6 (six) hours as needed for wheezing or shortness of breath.   1 Inhaler   prn    BP 136/70  Pulse 95  Temp(Src) 98.3 F (36.8 C) (Oral)  Resp 17  SpO2 99% Physical Exam  Nursing note and vitals reviewed. Constitutional: He appears well-developed and well-nourished. No distress.  HENT:  Head: Normocephalic and atraumatic.  Mouth/Throat: Oropharynx is clear and moist. No oropharyngeal exudate.  Bilateral nasal purulent discharge, purulent material in the posterior pharynx, mucous membranes moist, tympanic membranes clear bilaterally  Eyes: Conjunctivae and EOM are  normal. Pupils are equal, round, and reactive to light. Right eye exhibits no discharge. Left eye exhibits no discharge. No scleral icterus.  Neck: Normal range of motion. Neck supple. No JVD present. No thyromegaly present.  Cardiovascular: Normal rate, regular rhythm, normal heart sounds and intact distal pulses.  Exam reveals no gallop and no friction rub.   No murmur heard. Pulmonary/Chest: Effort normal and breath sounds normal. No respiratory distress. He has no wheezes. He has no rales. He exhibits no tenderness.  Abdominal: Soft. Bowel sounds are normal. He exhibits no distension and no mass. There is no tenderness.  Musculoskeletal: Normal range of motion. He exhibits no edema and no tenderness.  Lymphadenopathy:    He has no cervical adenopathy.  Neurological: He is alert. Coordination normal.  Skin: Skin is warm and dry. No rash noted. No erythema.  Psychiatric: He has a normal mood and affect. His behavior is normal.    ED Course  Procedures (including critical care time) Labs Review Labs Reviewed  CBC - Abnormal; Notable for the following:    Platelets 403 (*)    All other components within normal limits  BASIC METABOLIC PANEL - Abnormal; Notable for the following:    Glucose, Bld 106 (*)    GFR calc non Af Amer 83 (*)    All other components within normal limits  TROPONIN I - Abnormal; Notable for the following:    Troponin I 8.20 (*)    All other components within normal limits  PRO B NATRIURETIC PEPTIDE   Imaging Review Dg Chest Port 1 View  03/25/2014   CLINICAL DATA:  Shortness of breath and chest pain  EXAM: PORTABLE CHEST - 1 VIEW  COMPARISON:  None.  FINDINGS: Normal heart size and mediastinal contours. No acute infiltrate or edema. No effusion or pneumothorax. No acute osseous findings.  IMPRESSION: No active disease.   Electronically Signed   By: Tiburcio Pea M.D.   On: 03/25/2014 04:30     EKG Interpretation   Date/Time:  Monday March 25 2014 02:29:57  EDT Ventricular Rate:  96 PR Interval:  148 QRS Duration: 86 QT Interval:  332 QTC Calculation: 419 R Axis:   56 Text Interpretation:  Normal sinus rhythm Nonspecific T wave abnormality  Abnormal ECG No old tracing to compare Confirmed by Wacey Zieger  MD, Janis Sol  [phone]) on 03/25/2014 3:43:44 AM      MDM   Final diagnoses:  NSTEMI (non-ST elevated myocardial infarction)    Overall the patient is well-appearing, the EKG has a nonspecific T-wave abnormality, there is  no old EKG with which to compare. The patient has had a vague illness over the last 7-10 days possibly an early sinus infection for which the patient has recurrent significant sinus infections.  He has informed me that he has a nasal polyp which is in need of removal. He has no leukocytosis, normal renal function and electrolytes. Chest x-ray pending, troponin pending.  The patient has a positive troponin at over 8. This is significant and consistent with a non-ST elevation myocardial infarction. The patient will be given heparin IV, will be admitted to the cardiology service. This has been discussed with the cardiologist who is on call who will come see the patient. The patient is critically ill with a heart attack.  I have personally seen and interpreted the anterior posterior chest x-ray and find there to be no infiltrates, pneumothorax or abnormal cardiopulmonary findings. There is no free air under the diaphragm, no obvious pleural effusions, no bony or soft tissue abnormalities.   CRITICAL CARE Performed by: Vida RollerMILLER,Keeva Reisen D Total critical care time: 35 Critical care time was exclusive of separately billable procedures and treating other patients. Critical care was necessary to treat or prevent imminent or life-threatening deterioration. Critical care was time spent personally by me on the following activities: development of treatment plan with patient and/or surrogate as well as nursing, discussions with consultants,  evaluation of patient's response to treatment, examination of patient, obtaining history from patient or surrogate, ordering and performing treatments and interventions, ordering and review of laboratory studies, ordering and review of radiographic studies, pulse oximetry and re-evaluation of patient's condition.  Meds given in ED:  Medications  heparin injection 4,000 Units (4,000 Units Intravenous Given 03/25/14 0442)    New Prescriptions   No medications on file      Vida RollerBrian D Yosselin Zoeller, MD 03/25/14 [phone]

## 2014-03-25 NOTE — ED Notes (Signed)
Pt reports taking two 325 asa at 0200 this morning.

## 2014-03-25 NOTE — Progress Notes (Signed)
SUBJECTIVE:  No further chest pain since admission.  Ruled in.     PHYSICAL EXAM Filed Vitals:   03/25/14 0600 03/25/14 0615 03/25/14 0658 03/25/14 0939  BP: 128/68 129/73 154/74   Pulse: 97 95 95   Temp:   97.7 F (36.5 C)   TempSrc:   Oral   Resp: 18 27 20    SpO2: 95% 95% 100% 94%   General:  No distress Lungs:  Clear Heart:  RRR Abdomen:  Positive bowel sounds, no rebound no guarding Extremities:  No edema  LABS: Lab Results  Component Value Date   TROPONINI 6.51* 03/25/2014   Results for orders placed during the hospital encounter of 03/25/14 (from the past 24 hour(s))  CBC     Status: Abnormal   Collection Time    03/25/14  2:39 AM      Result Value Ref Range   WBC 9.5  4.0 - 10.5 K/uL   RBC 4.48  4.22 - 5.81 MIL/uL   Hemoglobin 13.6  13.0 - 17.0 g/dL   HCT 40.9  81.1 - 91.4 %   MCV 87.1  78.0 - 100.0 fL   MCH 30.4  26.0 - 34.0 pg   MCHC 34.9  30.0 - 36.0 g/dL   RDW 78.2  95.6 - 21.3 %   Platelets 403 (*) 150 - 400 K/uL  BASIC METABOLIC PANEL     Status: Abnormal   Collection Time    03/25/14  2:39 AM      Result Value Ref Range   Sodium 139  137 - 147 mEq/L   Potassium 3.8  3.7 - 5.3 mEq/L   Chloride 103  96 - 112 mEq/L   CO2 21  19 - 32 mEq/L   Glucose, Bld 106 (*) 70 - 99 mg/dL   BUN 14  6 - 23 mg/dL   Creatinine, Ser 0.86  0.50 - 1.35 mg/dL   Calcium 9.3  8.4 - 57.8 mg/dL   GFR calc non Af Amer 83 (*) >90 mL/min   GFR calc Af Amer >90  >90 mL/min  PRO B NATRIURETIC PEPTIDE     Status: None   Collection Time    03/25/14  2:39 AM      Result Value Ref Range   Pro B Natriuretic peptide (BNP) 111.2  0 - 125 pg/mL  TROPONIN I     Status: Abnormal   Collection Time    03/25/14  2:39 AM      Result Value Ref Range   Troponin I 8.20 (*) <0.30 ng/mL  TROPONIN I     Status: Abnormal   Collection Time    03/25/14  8:27 AM      Result Value Ref Range   Troponin I 6.51 (*) <0.30 ng/mL  LIPID PANEL     Status: Abnormal   Collection Time    03/25/14   8:27 AM      Result Value Ref Range   Cholesterol 154  0 - 200 mg/dL   Triglycerides 469  <629 mg/dL   HDL 38 (*) >52 mg/dL   Total CHOL/HDL Ratio 4.1     VLDL 23  0 - 40 mg/dL   LDL Cholesterol 93  0 - 99 mg/dL   No intake or output data in the 24 hours ending 03/25/14 1054  EKG:   Pending this AM.   ASSESSMENT AND PLAN:   NQWMI:  Ruled in.  No further pain.  Plan cath today.  The patient understands  that risks included but are not limited to stroke (1 in 1000), death (1 in 1000), kidney failure [usually temporary] (1 in 500), bleeding (1 in 200), allergic reaction [possibly serious] (1 in 200).  The patient understands and agrees to proceed.   Repeat EKG.  Check echo.    Risk reduction:  Not many risk factors. He was told that his mother died at age 49 with heart problems but he knows nothing mor about this.  Started on statin.  Continue this.   Fayrene FearingJames [facility]g [facility] 03/25/2014 10:54 AM

## 2014-03-25 NOTE — Progress Notes (Signed)
Pharmacy Note-Anticoagulation  Pharmacy Consult :  48 y.o. male is currently on Heparin for ACS.   Latest Labs : Hematology :  Recent Labs  03/25/14 0239 03/25/14 1415  HGB 13.6  --   HCT 39.0  --   PLT 403*  --   HEPARINUNFRC  --  0.24*  CREATININE 1.04  --     Lab Results  Component Value Date   HEPARINUNFRC 0.24* 03/25/2014   HGB 13.6 03/25/2014   HGB 13.7 12/15/2011    Current Medication[s] Include:  Meds Scheduled:  Scheduled:  . atorvastatin  40 mg Oral q1800  . fluticasone  1 spray Each Nare BID  . metoprolol tartrate  12.5 mg Oral BID  . mometasone-formoterol  2 puff Inhalation BID  . multivitamin with minerals  1 tablet Oral Daily  . omega-3 acid ethyl esters  1 g Oral Daily  . vitamin C  250 mg Oral Daily   Infusion[s]: Infusions:  . sodium chloride 75 mL/hr at 03/25/14 1609  . heparin 1,400 Units/hr (03/25/14 0846)    Assessment :  Heparin level is 0.24 units/ml on 1400 units/hr.  No bleeding complications observed.  Goal :  Heparin goal is Heparin level 0.3-0.7 units/ml.  Plan : 1.  Heparin bolus 2500 units IV now, then increase Heparin infusion to 1700 units/hr 2.  Next Heparin level in 6 hours. 3.  Daily Heparin level and CBC.  Monitor for bleeding complications.  Tongela Encinas, Elisha HeadlandEarle J, Pharm.D. 03/25/2014  4:45 PM

## 2014-03-25 NOTE — Interval H&P Note (Signed)
Cath Lab Visit (complete for each Cath Lab visit)  Clinical Evaluation Leading to the Procedure:   ACS: yes  Non-ACS:    Anginal Classification: CCS IV  Anti-ischemic medical therapy: Minimal Therapy (1 class of medications)  Non-Invasive Test Results: No non-invasive testing performed  Prior CABG: No previous CABG      History and Physical Interval Note:  03/25/2014 7:32 PM  Ginger Organony Insley  has presented today for surgery, with the diagnosis of CP  The various methods of treatment have been discussed with the patient and family. After consideration of risks, benefits and other options for treatment, the patient has consented to  Procedure(s): LEFT HEART CATHETERIZATION WITH CORONARY ANGIOGRAM (N/A) as a surgical intervention .  The patient's history has been reviewed, patient examined, no change in status, stable for surgery.  I have reviewed the patient's chart and labs.  Questions were answered to the patient's satisfaction.     Lesleigh NoeSMITH III,HENRY W

## 2014-03-25 NOTE — H&P (Signed)
Cardiology Consultation Note  Patient ID: Calvin [name], MRN: [id number], DOB/AGE: 03/24/1966 48 y.o. Admit date: 03/25/2014   Date of Consult: 03/25/2014 Primary Physician: [name], MD Primary Cardiologist: nill   Chief Complaint: chest pain  Reason for Consult: NSTE-ACS  HPI: 48 yr old Wm with hx of hypertriglyceridemia,MVP, asthma  present with chest pain   Pt states that he started experiencing this pain while going to bed last night. Describes this as sharp substernal radiating to left arm ' funny bone ' and jaw. No associated SOB , nausea. Did have this 1 day ago before going to bed as well. Additionally yesterday morning while he was standing in the shower he felt like he may have blacked out. Did not fall . Sat down in the shower. No prodromal symptoms or chest pain at that time.  Pt denies any orthopnea, PND, LE edema, palpitations, focal weakness   PSH  Works as Paramedic  No smoking or alcohol or recreational drug usage  Famil;y history  Mother died with ' cardiac problems in her 38's and uncle had valvular problems   Past Medical History  Diagnosis Date  . Hyperlipemia   . Psoriasis   . MVP (mitral valve prolapse)     Dr [name]  . Rhinitis   . Asthmatic bronchitis       Most Recent Cardiac Studies: NSR, TWI and flattening in inferolateral leads    Surgical History:  Past Surgical History  Procedure Laterality Date  . None       Home Meds: Prior to Admission medications   Medication Sig Start Date End Date Taking? Authorizing Provider  Ascorbic Acid (VITAMIN C PO) Take 1 tablet by mouth daily.   Yes Historical Provider, MD  fenofibrate 160 MG tablet Take 1 tablet by mouth daily. 12/09/11  Yes Historical Provider, MD  fluticasone (FLONASE) 50 MCG/ACT nasal spray Place 1 spray into both nostrils 2 (two) times daily.   Yes Historical Provider, MD  Misc Natural Products (GLUCOS-CHONDROIT-MSM COMPLEX) TABS Take 1 tablet by mouth daily.   Yes  Historical Provider, MD  mometasone-formoterol (DULERA) 200-5 MCG/ACT AERO Inhale 2 puffs into the lungs 2 (two) times daily.   Yes Historical Provider, MD  Multiple Vitamin (MULTIVITAMIN WITH MINERALS) TABS tablet Take 1 tablet by mouth daily.   Yes Historical Provider, MD  omega-3 acid ethyl esters (LOVAZA) 1 G capsule Take 1 g by mouth daily.   Yes Historical Provider, MD  albuterol (PROAIR HFA) 108 (90 BASE) MCG/ACT inhaler Inhale 2 puffs into the lungs every 6 (six) hours as needed for wheezing or shortness of breath. 02/21/12 07/17/14  Waymon Budge, MD    Inpatient Medications:     Allergies:  Allergies  Allergen Reactions  . Daliresp [Roflumilast]     GI UPSET    History   Social History  . Marital Status: Married    Spouse Name: N/A    Number of Children: 0  . Years of Education: N/A   Occupational History  . computer tech    Social History Main Topics  . Smoking status: Never Smoker   . Smokeless tobacco: Not on file  . Alcohol Use: Yes     Comment: occasional beer  . Drug Use: No  . Sexual Activity: Not on file   Other Topics Concern  . Not on file   Social History Narrative  . No narrative on file     Family History  Problem Relation Age of Onset  .  Emphysema Maternal Grandmother     non smoker  . Asthma Maternal Grandmother   . Prostate cancer Paternal Grandfather   . Heart disease Mother     deceased age 49  . Heart disease Maternal Uncle     valve replaced; deceased age 752     Review of Systems: General: negative for chills, fever, night sweats or weight changes.  Cardiovascular: see HPIP  Dermatological: negative for rash Respiratory: negative for cough or wheezing Urologic: negative for hematuria Abdominal: negative for nausea, vomiting, diarrhea, bright red blood per rectum, melena, or hematemesis Neurologic: negative for visual changes, syncope, or dizziness All other systems reviewed and are otherwise negative except as noted  above.  Labs:  Recent Labs  03/25/14 0239  TROPONINI 8.20*   Lab Results  Component Value Date   WBC 9.5 03/25/2014   HGB 13.6 03/25/2014   HCT 39.0 03/25/2014   MCV 87.1 03/25/2014   PLT 403* 03/25/2014    Recent Labs Lab 03/25/14 0239  NA 139  K 3.8  CL 103  CO2 21  BUN 14  CREATININE 1.04  CALCIUM 9.3  GLUCOSE 106*    Radiology/Studies:  Dg Chest Port 1 View  03/25/2014   CLINICAL DATA:  Shortness of breath and chest pain  EXAM: PORTABLE CHEST - 1 VIEW  COMPARISON:  None.  FINDINGS: Normal heart size and mediastinal contours. No acute infiltrate or edema. No effusion or pneumothorax. No acute osseous findings.  IMPRESSION: No active disease.   Electronically Signed   By: Tiburcio PeaJonathan  Watts M.D.   On: 03/25/2014 04:30    EKG: see HPI   Physical Exam: Blood pressure 140/76, pulse 93, temperature 98.3 F (36.8 C), temperature source Oral, resp. rate 17, SpO2 96.00%. General: Well developed, well nourished, in no acute distress. Head: Normocephalic, atraumatic, sclera non-icteric, no xanthomas, nares are without discharge.  Neck: Negative for carotid bruits. JVD not elevated. Lungs: Clear bilaterally to auscultation without wheezes, rales, or rhonchi. Breathing is unlabored. Heart: RRR with S1 S2. No murmurs, rubs, or gallops appreciated. Abdomen: Soft, non-tender, non-distended with normoactive bowel sounds. No hepatomegaly. No rebound/guarding. No obvious abdominal masses. Msk:  Strength and tone appear normal for age. Extremities: No clubbing or cyanosis. No edema.  Distal pedal pulses are 2+ and equal bilaterally. Neuro: Alert and oriented X 3. No facial asymmetry. No focal deficit. Moves all extremities spontaneously. Psych:  Responds to questions appropriately with a normal affect.     Assessment and Plan:  NST-ACS  Syncope/ near syncope   Plan  Aspirin , statin , Heparin , b-blocker  admit to tele , check lipids, hg A1c 2 D echocardiogram   NPO for likely  cath    Signed, Deborah ChalkYOUSUF, Romin Divita, A M.D  03/25/2014, 5:36 AM

## 2014-03-25 NOTE — ED Notes (Signed)
Portable CXR at the bedside.

## 2014-03-26 ENCOUNTER — Encounter (HOSPITAL_COMMUNITY)
Admission: EM | Disposition: A | Discharge status: Home / Self Care | Payer: Managed Care, Other (non HMO) | Source: Home / Self Care | Attending: Internal Medicine

## 2014-03-26 DIAGNOSIS — I2510 Atherosclerotic heart disease of native coronary artery without angina pectoris: Secondary | ICD-10-CM

## 2014-03-26 DIAGNOSIS — I214 Non-ST elevation (NSTEMI) myocardial infarction: Secondary | ICD-10-CM

## 2014-03-26 HISTORY — PX: LEFT HEART CATHETERIZATION WITH CORONARY ANGIOGRAM: SHX5451

## 2014-03-26 LAB — COMPREHENSIVE METABOLIC PANEL
ALBUMIN: 3.2 g/dL — AB (ref 3.5–5.2)
ALK PHOS: 64 U/L (ref 39–117)
ALT: 30 U/L (ref 0–53)
AST: 28 U/L (ref 0–37)
BILIRUBIN TOTAL: 0.5 mg/dL (ref 0.3–1.2)
BUN: 14 mg/dL (ref 6–23)
CHLORIDE: 105 meq/L (ref 96–112)
CO2: 22 mEq/L (ref 19–32)
Calcium: 8.4 mg/dL (ref 8.4–10.5)
Creatinine, Ser: 1.08 mg/dL (ref 0.50–1.35)
GFR calc Af Amer: >90 mL/min (ref >90)
GFR calc non Af Amer: 79 mL/min — ABNORMAL LOW (ref >90)
Glucose, Bld: 100 mg/dL — ABNORMAL HIGH (ref 70–99)
POTASSIUM: 4 meq/L (ref 3.7–5.3)
Sodium: 141 mEq/L (ref 137–147)
Total Protein: 6.3 g/dL (ref 6.0–8.3)

## 2014-03-26 LAB — CBC
HCT: 37.7 % — ABNORMAL LOW (ref 39.0–52.0)
Hemoglobin: 12.9 g/dL — ABNORMAL LOW (ref 13.0–17.0)
MCH: 30.3 pg (ref 26.0–34.0)
MCHC: 34.2 g/dL (ref 30.0–36.0)
MCV: 88.5 fL (ref 78.0–100.0)
PLATELETS: 373 10*3/uL (ref 150–400)
RBC: 4.26 MIL/uL (ref 4.22–5.81)
RDW: 13.4 % (ref 11.5–15.5)
WBC: 8.7 10*3/uL (ref 4.0–10.5)

## 2014-03-26 LAB — POCT ACTIVATED CLOTTING TIME: Activated Clotting Time: 487 seconds

## 2014-03-26 LAB — HEMOGLOBIN AND HEMATOCRIT, BLOOD
HCT: 36.4 % — ABNORMAL LOW (ref 39.0–52.0)
HCT: 32.8 % — ABNORMAL LOW (ref 39.0–52.0)
HEMOGLOBIN: 12.4 g/dL — AB (ref 13.0–17.0)
Hemoglobin: 11.1 g/dL — ABNORMAL LOW (ref 13.0–17.0)

## 2014-03-26 LAB — HEPARIN LEVEL (UNFRACTIONATED): HEPARIN UNFRACTIONATED: 0.25 [IU]/mL — AB (ref 0.30–0.70)

## 2014-03-26 SURGERY — LEFT HEART CATHETERIZATION WITH CORONARY ANGIOGRAM
Anesthesia: LOCAL

## 2014-03-26 MED ORDER — NITROGLYCERIN IN D5W 200-5 MCG/ML-% IV SOLN: 10.0000 ug/min | INTRAVENOUS | Status: DC

## 2014-03-26 MED ORDER — BIVALIRUDIN 250 MG IV SOLR
INTRAVENOUS | Status: AC
  Filled 2014-03-26: qty 250

## 2014-03-26 MED ORDER — MORPHINE SULFATE 2 MG/ML IJ SOLN
INTRAMUSCULAR | Status: AC
  Filled 2014-03-26: qty 1

## 2014-03-26 MED ORDER — HEPARIN SODIUM (PORCINE) 1000 UNIT/ML IJ SOLN
INTRAMUSCULAR | Status: AC
Start: 2014-03-26 — End: 2014-03-26
  Filled 2014-03-26: qty 1

## 2014-03-26 MED ORDER — ISOSORBIDE MONONITRATE ER 30 MG PO TB24
30.0000 mg | ORAL_TABLET | Freq: Every day | ORAL | Status: DC
  Administered 2014-03-27 – 2014-03-29 (×3): 30 mg via ORAL
  Filled 2014-03-26 (×3): qty 1

## 2014-03-26 MED ORDER — MORPHINE SULFATE 4 MG/ML IJ SOLN
1.0000 mg | INTRAMUSCULAR | Status: DC | PRN
  Administered 2014-03-26: 2 mg via INTRAVENOUS

## 2014-03-26 MED ORDER — ADENOSINE 12 MG/4ML IV SOLN
16.0000 mL | Freq: Once | INTRAVENOUS | Status: DC
  Filled 2014-03-26: qty 16

## 2014-03-26 MED ORDER — FENTANYL CITRATE 0.05 MG/ML IJ SOLN
INTRAMUSCULAR | Status: AC
  Filled 2014-03-26: qty 2

## 2014-03-26 MED ORDER — HEPARIN (PORCINE) IN NACL 2-0.9 UNIT/ML-% IJ SOLN
INTRAMUSCULAR | Status: AC
  Filled 2014-03-26: qty 1000

## 2014-03-26 MED ORDER — SODIUM CHLORIDE 0.9 % IV SOLN: 1.7500 mg/kg/h | INTRAVENOUS | Status: AC

## 2014-03-26 MED ORDER — ASPIRIN 81 MG PO CHEW
81.0000 mg | CHEWABLE_TABLET | Freq: Every day | ORAL | Status: DC
  Administered 2014-03-27 – 2014-03-29 (×3): 81 mg via ORAL
  Filled 2014-03-26 (×3): qty 1

## 2014-03-26 MED ORDER — ACETAMINOPHEN 325 MG PO TABS
650.0000 mg | ORAL_TABLET | Freq: Four times a day (QID) | ORAL | Status: DC | PRN
  Administered 2014-03-26 – 2014-03-27 (×2): 650 mg via ORAL
  Filled 2014-03-26 (×2): qty 2

## 2014-03-26 MED ORDER — MIDAZOLAM HCL 2 MG/2ML IJ SOLN
INTRAMUSCULAR | Status: AC
  Filled 2014-03-26: qty 2

## 2014-03-26 MED ORDER — TICAGRELOR 90 MG PO TABS
ORAL_TABLET | ORAL | Status: AC
  Filled 2014-03-26: qty 2

## 2014-03-26 MED ORDER — BIVALIRUDIN 250 MG IV SOLR
1.7500 mg/kg/h | INTRAVENOUS | Status: DC
  Filled 2014-03-26: qty 250

## 2014-03-26 MED ORDER — SODIUM CHLORIDE 0.9 % IV SOLN
INTRAVENOUS | Status: DC
  Administered 2014-03-26: 12:00:00 via INTRAVENOUS
  Administered 2014-03-27: 50 mL/h via INTRAVENOUS
  Administered 2014-03-27: 02:00:00 via INTRAVENOUS

## 2014-03-26 MED ORDER — NITROGLYCERIN IN D5W 200-5 MCG/ML-% IV SOLN
INTRAVENOUS | Status: AC
  Filled 2014-03-26: qty 250

## 2014-03-26 MED ORDER — LIDOCAINE HCL (PF) 1 % IJ SOLN
INTRAMUSCULAR | Status: AC
  Filled 2014-03-26: qty 30

## 2014-03-26 MED ORDER — METOPROLOL TARTRATE 25 MG PO TABS
25.0000 mg | ORAL_TABLET | Freq: Two times a day (BID) | ORAL | Status: DC
  Administered 2014-03-26 – 2014-03-29 (×6): 25 mg via ORAL
  Filled 2014-03-26 (×7): qty 1

## 2014-03-26 MED ORDER — TICAGRELOR 90 MG PO TABS
90.0000 mg | ORAL_TABLET | Freq: Two times a day (BID) | ORAL | Status: DC
  Administered 2014-03-26: 90 mg via ORAL
  Filled 2014-03-26 (×4): qty 1

## 2014-03-26 MED ORDER — VERAPAMIL HCL 2.5 MG/ML IV SOLN
INTRAVENOUS | Status: AC
  Filled 2014-03-26: qty 2

## 2014-03-26 NOTE — Progress Notes (Signed)
Pt   had bright red to dark bloody loose  stool . Marland Kitchen. Denies abdominal pain "just feeling Squeezy a little bit in my stomach" Dr Eusebio Mesith notified, with orders, H/H requested stat. Monitored patient.

## 2014-03-26 NOTE — Progress Notes (Signed)
ANTICOAGULATION CONSULT NOTE   Pharmacy Consult for heparin Indication: chest pain/ACS  Allergies  Allergen Reactions  . Daliresp [Roflumilast]     GI UPSET    Patient Measurements: Heparin Dosing Weight: 100kg  Vital Signs: Temp: 98.4 F (36.9 C) (03/30 2013) Temp src: Oral (03/30 2013) BP: 145/80 mmHg (03/30 2013) Pulse Rate: 85 (03/30 2013)  Labs:  Recent Labs  03/25/14 0239 03/25/14 0827 03/25/14 1415 03/25/14 1844 03/26/14 0220  HGB 13.6  --   --   --  12.9*  HCT 39.0  --   --   --  37.7*  PLT 403*  --   --   --  373  HEPARINUNFRC  --   --  0.24*  --  0.25*  CREATININE 1.04  --   --   --   --   TROPONINI 8.20* 6.51* 3.57* 2.13*  --     Medical History: Past Medical History  Diagnosis Date  . Hyperlipemia   . Psoriasis   . MVP (mitral valve prolapse)     Dr Lady GaryFath  . Rhinitis   . Asthmatic bronchitis   . Chronic bronchitis   . Non Q wave myocardial infarction 03/23/2014    Medications:  Prescriptions prior to admission  Medication Sig Dispense Refill  . Ascorbic Acid (VITAMIN C PO) Take 1 tablet by mouth daily.      . fenofibrate 160 MG tablet Take 1 tablet by mouth daily.      . fluticasone (FLONASE) 50 MCG/ACT nasal spray Place 1 spray into both nostrils 2 (two) times daily.      . Misc Natural Products (GLUCOS-CHONDROIT-MSM COMPLEX) TABS Take 1 tablet by mouth daily.      . mometasone-formoterol (DULERA) 200-5 MCG/ACT AERO Inhale 2 puffs into the lungs 2 (two) times daily.      . Multiple Vitamin (MULTIVITAMIN WITH MINERALS) TABS tablet Take 1 tablet by mouth daily.      Marland Kitchen. omega-3 acid ethyl esters (LOVAZA) 1 G capsule Take 1 g by mouth daily.      Marland Kitchen. albuterol (PROAIR HFA) 108 (90 BASE) MCG/ACT inhaler Inhale 2 puffs into the lungs every 6 (six) hours as needed for wheezing or shortness of breath.  1 Inhaler  prn   Scheduled:  . aspirin  81 mg Oral Pre-Cath  . atorvastatin  40 mg Oral q1800  . fluticasone  1 spray Each Nare BID  . metoprolol  tartrate  12.5 mg Oral BID  . mometasone-formoterol  2 puff Inhalation BID  . multivitamin with minerals  1 tablet Oral Daily  . omega-3 acid ethyl esters  1 g Oral Daily  . sodium chloride  3 mL Intravenous Q12H  . vitamin C  250 mg Oral Daily    Assessment: 48yo male c/o sharp substernal CP radiating to left arm and jaw with near syncope, initial troponin elevated, on heparin.  Heparin level 0.25 units/ml  Goal of Therapy:  Heparin level 0.3-0.7 units/ml Monitor platelets by anticoagulation protocol: Yes   Plan:  Increase heparin to 1900 units/hr F/u after cath  Talbert CageLora Israa Caban, PharmD 03/26/2014,2:45 AM

## 2014-03-26 NOTE — CV Procedure (Signed)
Left Heart Catheterization with Coronary Angiography and PCI Report  Donavon Kimrey  48 y.o.  male February 03, 1966  Procedure Date: 03/26/2014  Referring Physician: Antoine Poche, MD Primary Cardiologist:: Antoine Poche, MD  INDICATIONS: ACS  PROCEDURE: 1. Left heart cath; 2. Coronary angiography; 3. Left ventriculography; 4. PCI diagonal #1; 5. FFR mid LAD  CONSENT:  The risks, benefits, and details of the procedure were explained in detail to the patient. Risks including death, stroke, heart attack, kidney injury, allergy, limb ischemia, bleeding and radiation injury were discussed.  The patient verbalized understanding and wanted to proceed.  Informed written consent was obtained.  PROCEDURE TECHNIQUE:  After Xylocaine anesthesia a 5 French Slender sheath was placed in the right radial artery with a single anterior needle wall stick.  Coronary angiography was done using a 5 [place] JR 4 and JL 3.5 cm diagnostic catheter.  Left ventriculography was done using the JR 4 diagnostic catheter and hand injection.   Diagonal #1 was obviously the cause of the patient's non-ST elevation MI. Segmental diffuse disease was noted up to 95% The LAD starting at the origin og the diagonal and extending to mid vessel was eccentric and 60-80% narrowed.  After discussing the findings with the patient and his wife we proceeded with the plan for PCI of the diagonal an FFR of the LAD.  We proceeded with PCI on the culprit vessel the first. We administered a bivalirudin bolus and infusion to get an ACT that was greater than 300. Brilinta was administered as a180 mg loading dose. We used a 6 French 3.5 cm XB LAD, a Pro-water wire, and a 15 x 2.0 mm balloon. Multiple overlapping balloon inflations were performed. We positioned and deployed a Promus Premier 2.25 x 20 mm long drug-eluting stent to 12 atmospheres. Postdilatation was performed with a 2.25 x 12 mm [place] balloon to 15 atmospheres. TIMI grade 3 flow was noted post. No  complications occurred.  We then turned our attention to the LAD. FFR was performed using a Prime Wire. Without adenosine infusion the FFR was 0.62. I was somewhat surprised by this. We then decided to perform PCI on the LAD. After laying out the LAD it became obvious to me that the stent would likely extend at least to the diagonal and may even need to cover the ostium of the diagonal. This could cause reduced flow and stent occlusion in the diagonal. I made the decision to postpone LAD PCI until the patient has had a 3-6 week healing in the previously stented diagonal. We will have him to come back for LAD stenting within the next 4 weeks.  Post procedure the patient had 5/10 chest discomfort without EKG changes. TIMI grade 3 flow was noted in all territories. IV nitroglycerin was started. The discomfort gradually resolved. It is felt that the discomfort was likely related to spasm in the diagonal territory.  MEDICATIONS: Fentanyl 100 mcg; Versed 2 mg IV   CONTRAST: Total of 250 cc.  COMPLICATIONS:  None   HEMODYNAMICS:  Aortic pressure 110/73 mmHg ; LV pressure 123/3 mmHg 12 mmHg  ANGIOGRAPHIC DATA:   The left main coronary artery is calcified but widely patent.  The left anterior descending artery is dominant. Gives origin to a moderate sized first diagonal contains 95-99% segmental stenosis in the proximal segment. The LAD itself contains intermediate stenosis starting just distal to the origin of the diagonal and extending into the mid vessel obstructing up to 80%..  The left circumflex artery is  large and bifurcates on the lateral wall. The mid vessel contains eccentric 50-60% narrowing.  The right coronary artery is calcified and widely patent. Diffuse luminal irregularity is noted.Marland Kitchen.   PCI RESULTS: Successful PCI of the culprit vessel for the patient's non-ST elevation MI with reduction and 99% stenosis to 0% with TIMI grade 3 flow in the first diagonal after implantation of a 20 x  2.25 mm drug-eluting stent.  FFR without adenosine infusion demonstrated a value of 0.64 across the mid LAD suggesting that the stenosis is significant.  We decided against treatment of the LAD at this time because stenting the LAD would impact the ostium of the first diagonal and may reduce flow and therefore called stent thrombosis. The patient will return for PCI of the LAD in the next 3-6 weeks.  LEFT VENTRICULOGRAM:  Left ventricular angiogram was done in the 30 RAO projection and revealed no significant wall motion abnormality with EF of 60%   IMPRESSIONS:  1. Non-ST elevation MI due to high-grade obstruction in the moderate-sized first diagonal. 2. Successful DES in the diagonal with reduction in stenosis from 99% to 0% with TIMI grade 3 flow 3. Hemodynamically significant proximal/mid LAD just beyond the origin of the treated diagonal, that appears to contain 60-80% obstruction but has a significant pressure drop/FFR of 0.62 without adenosine infusion. 4. Moderate mid circumflex with concentric 50-60% stenosis. RCA is diffusely diseased but widely patent 5. Overall normal LV function   RECOMMENDATION:  Aspirin and Brilinta Continue Bivalirudin , full dose until the current bag runs out Ambulate later today Wean and DC nitroglycerin later today. Nitroglycerin was started because of presumed spasm in the diagonal causing chest discomfort post procedure. There were no associated EKG changes. The patient will be eligible for discharge in the a.m. assuming no significant recurrence of chest pain and will be electively scheduled for LAD PCI and FFR of the circumflex in 3-6 weeks by Dr. Verdis PrimeHenry Chessa Barrasso. Statin therapy, beta blocker therapy, and long-acting nitrates should be prescribed in addition to the current dual antiplatelet therapy at discharge.  .Marland Kitchen

## 2014-03-26 NOTE — Progress Notes (Signed)
Nurse informed me of blood in the stool. The patient states that his commode after bowel movement was the color of red wine. He feels nausea. Hgb is 12.1. Having mild persistent CP since the diagonal PCI.  Plan to track Hemoglobin every six hours. Wean ad D/C NTG drip. Hold Brilinta tonight, and resume in AM. Has been loaded today.

## 2014-03-26 NOTE — Progress Notes (Signed)
TR BAND REMOVAL  LOCATION:    right radial  DEFLATED PER PROTOCOL:    yes  TIME BAND OFF / DRESSING APPLIED:    1415   SITE UPON ARRIVAL:    Level 0  SITE AFTER BAND REMOVAL:    Level 0  REVERSE ALLEN'S TEST:     positive  CIRCULATION SENSATION AND MOVEMENT:    Within Normal Limits   yes  COMMENTS:   Tolerated procedure well

## 2014-03-27 ENCOUNTER — Encounter (HOSPITAL_COMMUNITY): Payer: Self-pay | Admitting: Cardiology

## 2014-03-27 DIAGNOSIS — E785 Hyperlipidemia, unspecified: Secondary | ICD-10-CM | POA: Diagnosis present

## 2014-03-27 DIAGNOSIS — I2510 Atherosclerotic heart disease of native coronary artery without angina pectoris: Secondary | ICD-10-CM

## 2014-03-27 DIAGNOSIS — K922 Gastrointestinal hemorrhage, unspecified: Secondary | ICD-10-CM | POA: Diagnosis not present

## 2014-03-27 DIAGNOSIS — I200 Unstable angina: Secondary | ICD-10-CM

## 2014-03-27 DIAGNOSIS — J019 Acute sinusitis, unspecified: Secondary | ICD-10-CM | POA: Diagnosis present

## 2014-03-27 HISTORY — DX: Atherosclerotic heart disease of native coronary artery without angina pectoris: I25.10

## 2014-03-27 HISTORY — DX: Hyperlipidemia, unspecified: E78.5

## 2014-03-27 HISTORY — DX: Gastrointestinal hemorrhage, unspecified: K92.2

## 2014-03-27 HISTORY — DX: Acute sinusitis, unspecified: J01.90

## 2014-03-27 LAB — CBC
HCT: 33.4 % — ABNORMAL LOW (ref 39.0–52.0)
HEMATOCRIT: 32.1 % — AB (ref 39.0–52.0)
HEMOGLOBIN: 11 g/dL — AB (ref 13.0–17.0)
HEMOGLOBIN: 11.3 g/dL — AB (ref 13.0–17.0)
MCH: 30.1 pg (ref 26.0–34.0)
MCH: 29.7 pg (ref 26.0–34.0)
MCHC: 34.3 g/dL (ref 30.0–36.0)
MCHC: 33.8 g/dL (ref 30.0–36.0)
MCV: 87.7 fL (ref 78.0–100.0)
MCV: 87.7 fL (ref 78.0–100.0)
Platelets: 375 10*3/uL (ref 150–400)
Platelets: 385 10*3/uL (ref 150–400)
RBC: 3.66 MIL/uL — ABNORMAL LOW (ref 4.22–5.81)
RBC: 3.81 MIL/uL — ABNORMAL LOW (ref 4.22–5.81)
RDW: 13.3 % (ref 11.5–15.5)
RDW: 13.4 % (ref 11.5–15.5)
WBC: 7.8 10*3/uL (ref 4.0–10.5)
WBC: 8.5 10*3/uL (ref 4.0–10.5)

## 2014-03-27 LAB — TROPONIN I: TROPONIN I: 4.96 ng/mL — AB (ref <0.30)

## 2014-03-27 LAB — BASIC METABOLIC PANEL
BUN: 13 mg/dL (ref 6–23)
BUN: 12 mg/dL (ref 6–23)
CHLORIDE: 105 meq/L (ref 96–112)
CO2: 19 mEq/L (ref 19–32)
CO2: 20 mEq/L (ref 19–32)
Calcium: 8.3 mg/dL — ABNORMAL LOW (ref 8.4–10.5)
Calcium: 8.3 mg/dL — ABNORMAL LOW (ref 8.4–10.5)
Chloride: 105 mEq/L (ref 96–112)
Creatinine, Ser: 1 mg/dL (ref 0.50–1.35)
Creatinine, Ser: 0.92 mg/dL (ref 0.50–1.35)
GFR calc Af Amer: >90 mL/min (ref >90)
GFR calc non Af Amer: 87 mL/min — ABNORMAL LOW (ref >90)
GLUCOSE: 95 mg/dL (ref 70–99)
Glucose, Bld: 99 mg/dL (ref 70–99)
POTASSIUM: 3.5 meq/L — AB (ref 3.7–5.3)
Potassium: 4 mEq/L (ref 3.7–5.3)
SODIUM: 139 meq/L (ref 137–147)
SODIUM: 138 meq/L (ref 137–147)

## 2014-03-27 LAB — HEMOGLOBIN AND HEMATOCRIT, BLOOD
HEMATOCRIT: 32.5 % — AB (ref 39.0–52.0)
HEMOGLOBIN: 11 g/dL — AB (ref 13.0–17.0)

## 2014-03-27 LAB — OCCULT BLOOD X 1 CARD TO LAB, STOOL: Fecal Occult Bld: POSITIVE — AB

## 2014-03-27 MED ORDER — HEART ATTACK BOUNCING BOOK
Freq: Once | Status: AC
  Administered 2014-03-27: 06:00:00
  Filled 2014-03-27: qty 1

## 2014-03-27 MED ORDER — SODIUM CHLORIDE 0.9 % IV SOLN: INTRAVENOUS | Status: DC

## 2014-03-27 MED ORDER — FLEET ENEMA 7-19 GM/118ML RE ENEM
2.0000 | ENEMA | Freq: Once | RECTAL | Status: AC
  Administered 2014-03-27: 20:00:00 via RECTAL
  Filled 2014-03-27: qty 2

## 2014-03-27 MED ORDER — CLOPIDOGREL BISULFATE 75 MG PO TABS: 75.0000 mg | ORAL_TABLET | Freq: Every day | ORAL | Status: DC

## 2014-03-27 MED ORDER — AZITHROMYCIN 250 MG PO TABS
250.0000 mg | ORAL_TABLET | Freq: Every day | ORAL | Status: DC
  Administered 2014-03-28 – 2014-03-29 (×2): 250 mg via ORAL
  Filled 2014-03-27 (×2): qty 1

## 2014-03-27 MED ORDER — AZITHROMYCIN 500 MG PO TABS
500.0000 mg | ORAL_TABLET | Freq: Every day | ORAL | Status: AC
Start: 2014-03-27 — End: 2014-03-27
  Administered 2014-03-27: 500 mg via ORAL
  Filled 2014-03-27: qty 1

## 2014-03-27 MED ORDER — CLOPIDOGREL BISULFATE 75 MG PO TABS
75.0000 mg | ORAL_TABLET | Freq: Every day | ORAL | Status: DC
  Administered 2014-03-27 – 2014-03-29 (×3): 75 mg via ORAL
  Filled 2014-03-27 (×3): qty 1

## 2014-03-27 MED FILL — Sodium Chloride IV Soln 0.9%: INTRAVENOUS | Qty: 50 | Status: AC

## 2014-03-27 NOTE — Progress Notes (Signed)
48 yr old Wm with hx of hypertriglyceridemia,MVP, asthma present with chest pain and ruled in for NSTEMI. Pk troponin 8.20. Cath 03/26/14:  Non-ST elevation MI due to high-grade obstruction in the moderate-sized first diagonal.  2. Successful DES in the diagonal with reduction in stenosis from 99% to 0% with TIMI grade 3 flow  3. Hemodynamically significant proximal/mid LAD just beyond the origin of the treated diagonal, that appears to contain 60-80% obstruction but has a significant pressure drop/FFR of 0.62 without adenosine infusion.  4. Moderate mid circumflex with concentric 50-60% stenosis. RCA is diffusely diseased but widely patent  5. Overall normal LV function  Now with continued chest pressure and bloody stools and low grade temp.  Subjective:  Pt with bloody stool, obvious blood in toilet last pm and this AM. Bright blood. Pt states diarrhea from sinus infection. + sinus drainage down throat occuring before hospitalization. Brilinta was to be held last pm but did receive. Will recheck cbc now and for continued chest pressure will recheck tropinin. Also low grade temp.  Objective:  Vital signs in last 24 hours:  Temp: [98.1 F (36.7 C)-99.6 F (37.6 C)] 98.5 F (36.9 C) (04/01 0530)  Pulse Rate: [79-108] 79 (04/01 0530)  Resp: [16-20] 20 (04/01 0530)  BP: (98-137)/(61-77) 124/69 mmHg (04/01 0530)  SpO2: [93 %-96 %] 95 % (04/01 0530)  Weight: [217 lb 2.5 oz (98.5 kg)] 217 lb 2.5 oz (98.5 kg) (04/01 0530)  Weight change: -4 lb 13.6 oz (-2.2 kg)  Last BM Date: 03/26/13  Intake/Output from previous day: +600 wt 217 down from 222  03/31 0701 - 04/01 0700  In: 1601.5 [P.O.:244; I.V.:1357.5]  Out: 1001 [Urine:1000; Stool:1]  Intake/Output this shift:  Total I/O  In: -  Out: 500 [Urine:500]  PE: General:Pleasant affect, NAD, continues with chest pain, 4 at the worst over the night now a 2.  Skin:Warm and dry, brisk capillary refill  HEENT:normocephalic, sclera clear, mucus  membranes moist  Neck:supple, no JVD  Heart:S1S2 RRR without murmur, gallup, rub or click  Lungs:clear without rales, rhonchi, or wheezes  YQI:HKVQAbd:soft, non tender, + BS, do not palpate liver spleen or masses  Ext:no lower ext edema, 2+ pedal pulses, 2+ radial pulses, brisk capillry refill, cath site rt wrist without hematoma.  Neuro:alert and oriented, MAE, follows commands, + facial symmetry  EKG with deep t wave inversions in V 3  Lab Results:   Recent Labs   03/26/14 0220   03/26/14 2245  03/27/14 0252   WBC  8.7  --  --  7.8   HGB  12.9*  < >  11.1*  11.0*   HCT  37.7*  < >  32.8*  32.1*   PLT  373  --  --  375   < > = values in this interval not displayed.  BMET   Recent Labs   03/25/14 0239  03/26/14 0220   NA  139  141   K  3.8  4.0   CL  103  105   CO2  21  22   GLUCOSE  106*  100*   BUN  14  14   CREATININE  1.04  1.08   CALCIUM  9.3  8.4     Recent Labs   03/25/14 1415  03/25/14 1844   TROPONINI  3.57*  2.13*    Lab Results   Component  Value  Date    CHOL  154  03/25/2014    HDL  38*  03/25/2014    LDLCALC  93  03/25/2014    TRIG  115  03/25/2014    CHOLHDL  4.1  03/25/2014    Lab Results   Component  Value  Date    HGBA1C  6.1*  03/25/2014    No results found for this basename: TSH    Hepatic Function Panel   Recent Labs   03/26/14 0220   PROT  6.3   ALBUMIN  3.2*   AST  28   ALT  30   ALKPHOS  64   BILITOT  0.5     Recent Labs   03/25/14 0827   CHOL  154    No results found for this basename: PROTIME, in the last 72 hours  Studies/Results:  2D Echo:  Left ventricle: The cavity size was normal. Wall thickness was increased in a pattern of mild LVH. Systolic function was normal. The estimated ejection fraction was in the range of 55% to 60%. Wall motion was normal; there were no regional wall motion abnormalities  Medications: I have reviewed the patient's current medications.  Scheduled Meds:  .  aspirin  81 mg  Oral  Daily   .   atorvastatin  40 mg  Oral  q1800   .  isosorbide mononitrate  30 mg  Oral  Daily   .  metoprolol tartrate  25 mg  Oral  BID   .  mometasone-formoterol  2 puff  Inhalation  BID   .  multivitamin with minerals  1 tablet  Oral  Daily   .  omega-3 acid ethyl esters  1 g  Oral  Daily   .  Ticagrelor  90 mg  Oral  Q12H   .  vitamin C  250 mg  Oral  Daily    Continuous Infusions:  .  sodium chloride  150 mL/hr at 03/27/14 0139   .  nitroGLYCERIN  10 mcg/min (03/27/14 0600)    PRN Meds:.acetaminophen, albuterol, loperamide, morphine, nitroGLYCERIN  Assessment/Plan:  Principal Problem:  1. ACS (acute coronary syndrome) with NSTEMI- troponins coming down on BB, statin, imdur still weaning NTG drip, recheck troponin  Active Problems:  2. CAD (coronary artery disease), native coronary artery, DES placed to 1st diag. 03/26/14; residual disease to LAD with FFR to 0.62 and RCA of 50-60%- plan for PCI to LAD,  3. GI bleed, obvious gross blood, will get GI consult, pt has never had this before, will make NPO for now.  4. Dyslipidemia, goal LDL below 70, on statin  5. Sinus infection- add z pak.  LOS: 2 days  Time spent with pt. :15 minutes.  [facility] R Nurse Practitioner Certified  Pager [phone] or after 5pm and on weekends call [phone]  03/27/2014, 6:21 AM   Patient seen and examined and history reviewed. Agree with above findings and plan. Patient had chest pain post procedure but this resolved during the night. Ecg without acute change with inferior T wave abnormality. Now with gross rectal bleeding. Needs to continue dual antiplatelet therapy for now with fresh stent. Can switch to Plavix per Dr. Michaelle Copas recommendation. Will consult GI for work up. Start Zpak for sinus infection. Recheck Hgb now.   Theron Arista [facility] 03/27/2014 10:53 AM

## 2014-03-27 NOTE — Progress Notes (Signed)
Pt had a dark bloody loose stool, vital signs stable as charted, pt denies abdominal pain, Calvin BoozerLaura Ingold NP informed, will continue to monitor patient.

## 2014-03-27 NOTE — Consult Note (Addendum)
Unassigned patient Reason for Consult: Diarrhea with rectal bleeding. Referring Physician: Dr. Peter Martinique. PCP: [place] Pink, MD-[place], [place]  Vaughn Beaumier is an 48 y.o. male.  HPI: 48 year old white male, with a history of hyperlipidemia, asthma and MVP, on Fenofibrate at home presented to the ER with chest pain and ruled in for a NSTEMI-had a cardiac cath with a DES placed in his diagonal and was started on Brilinta; noted to have diffuse RCA disease but the vessel was patent along with normal LV function; also noted to have a hemodynamically significant proximal-mid-LAD lesion. The evening after the stent placement, 2 hours after he got back to the room he had a bloody BM and has had 3-4 loose bloody BM's per day since then. He denies having any melena. There is no known history of PUD, jaundice or colitis. There is history of NSAID use or abuse. His diarrhea however, developed about 1 week prior to admission. He thinks he may have had some "fast food" prior to the onset of his symptoms but denies any sick contacts, recent travel or antibiotic use. He is on Zithromax in the hospital. He usually has 2-3 BM's per week and can go 3-4 days without a BM. He denies any associated abdominal pain or cramping with the diarrhea. There is no previous history of diverticulosis. He has recently had a lot of gas and bloating but denies having any chills or rigors. He has never had a colonoscopy. He denies a family history of colon cancer or IBD. He has now been switched to Plavix and has been maintained on the Aspirin.   Past Medical History  Diagnosis Date  . Hyperlipidemia   . Psoriasis   . MVP (mitral valve prolapse)     Dr Ubaldo Glassing  . Rhinitis   . Asthmatic bronchitis   . Chronic bronchitis   . Non Q wave myocardial infarction 03/23/2014  . CAD (coronary artery disease), native coronary artery, DES placed to 1st diag. 03/26/14; residual disease to LAD with FFR to 0.62 and RCA of 50-60%- plan for PCI to LAD  03/27/2014  . ACS (acute coronary syndrome) with NSTEMI 03/25/2014  .  Chronic constipation       . Recurrent sinusitus    Past Surgical History  Procedure Laterality Date  . No past surgeries     Family History  Problem Relation Age of Onset  . Emphysema Maternal Grandmother     non smoker  . Asthma Maternal Grandmother   . Prostate cancer Paternal Grandfather   . Heart disease Mother     deceased age 35  . Heart disease Maternal Uncle     valve replaced; deceased age 65  Maternal grandfather had stomach cancer in his 64"s. There is no known family history of colon cancer.  Social History:  reports that he has never smoked. He has never used smokeless tobacco. He reports that he drinks alcohol. He reports that he does not use illicit drugs.  Allergies:  Allergies  Allergen Reactions  . Daliresp [Roflumilast]     GI UPSET   Medications: I have reviewed the patient's current medications.  Results for orders placed during the hospital encounter of 03/25/14 (from the past 48 hour(s))  TROPONIN I     Status: Abnormal   Collection Time    03/25/14  6:44 PM      Result Value Ref Range   Troponin I 2.13 (*) <0.30 ng/mL   Comment:  Due to the release kinetics of cTnI,     a negative result within the first hours     of the onset of symptoms does not rule out     myocardial infarction with certainty.     If myocardial infarction is still suspected,     repeat the test at appropriate intervals.     CRITICAL VALUE NOTED.  VALUE IS CONSISTENT WITH PREVIOUSLY REPORTED AND CALLED VALUE.  COMPREHENSIVE METABOLIC PANEL     Status: Abnormal   Collection Time    03/26/14  2:20 AM      Result Value Ref Range   Sodium 141  137 - 147 mEq/L   Potassium 4.0  3.7 - 5.3 mEq/L   Chloride 105  96 - 112 mEq/L   CO2 22  19 - 32 mEq/L   Glucose, Bld 100 (*) 70 - 99 mg/dL   BUN 14  6 - 23 mg/dL   Creatinine, Ser 1.08  0.50 - 1.35 mg/dL   Calcium 8.4  8.4 - 10.5 mg/dL   Total Protein  6.3  6.0 - 8.3 g/dL   Albumin 3.2 (*) 3.5 - 5.2 g/dL   AST 28  0 - 37 U/L   ALT 30  0 - 53 U/L   Alkaline Phosphatase 64  39 - 117 U/L   Total Bilirubin 0.5  0.3 - 1.2 mg/dL   GFR calc non Af Amer 79 (*) >90 mL/min   GFR calc Af Amer >90  >90 mL/min   Comment: (NOTE)     The eGFR has been calculated using the CKD EPI equation.     This calculation has not been validated in all clinical situations.     eGFR's persistently <90 mL/min signify possible Chronic Kidney     Disease.  HEPARIN LEVEL (UNFRACTIONATED)     Status: Abnormal   Collection Time    03/26/14  2:20 AM      Result Value Ref Range   Heparin Unfractionated 0.25 (*) 0.30 - 0.70 IU/mL   Comment:            IF HEPARIN RESULTS ARE BELOW     EXPECTED VALUES, AND PATIENT     DOSAGE HAS BEEN CONFIRMED,     SUGGEST FOLLOW UP TESTING     OF ANTITHROMBIN III LEVELS.  CBC     Status: Abnormal   Collection Time    03/26/14  2:20 AM      Result Value Ref Range   WBC 8.7  4.0 - 10.5 K/uL   RBC 4.26  4.22 - 5.81 MIL/uL   Hemoglobin 12.9 (*) 13.0 - 17.0 g/dL   HCT 37.7 (*) 39.0 - 52.0 %   MCV 88.5  78.0 - 100.0 fL   MCH 30.3  26.0 - 34.0 pg   MCHC 34.2  30.0 - 36.0 g/dL   RDW 13.4  11.5 - 15.5 %   Platelets 373  150 - 400 K/uL  POCT ACTIVATED CLOTTING TIME     Status: None   Collection Time    03/26/14  8:29 AM      Result Value Ref Range   Activated Clotting Time 487    HEMOGLOBIN AND HEMATOCRIT, BLOOD     Status: Abnormal   Collection Time    03/26/14  4:30 PM      Result Value Ref Range   Hemoglobin 12.4 (*) 13.0 - 17.0 g/dL   HCT 36.4 (*) 39.0 - 52.0 %  HEMOGLOBIN AND HEMATOCRIT,  BLOOD     Status: Abnormal   Collection Time    03/26/14 10:45 PM      Result Value Ref Range   Hemoglobin 11.1 (*) 13.0 - 17.0 g/dL   HCT 32.8 (*) 39.0 - 52.0 %  CBC     Status: Abnormal   Collection Time    03/27/14  2:52 AM      Result Value Ref Range   WBC 7.8  4.0 - 10.5 K/uL   RBC 3.66 (*) 4.22 - 5.81 MIL/uL   Hemoglobin 11.0  (*) 13.0 - 17.0 g/dL   HCT 32.1 (*) 39.0 - 52.0 %   MCV 87.7  78.0 - 100.0 fL   MCH 30.1  26.0 - 34.0 pg   MCHC 34.3  30.0 - 36.0 g/dL   RDW 13.3  11.5 - 15.5 %   Platelets 375  150 - 400 K/uL  BASIC METABOLIC PANEL     Status: Abnormal   Collection Time    03/27/14  2:52 AM      Result Value Ref Range   Sodium 139  137 - 147 mEq/L   Potassium 4.0  3.7 - 5.3 mEq/L   Chloride 105  96 - 112 mEq/L   CO2 19  19 - 32 mEq/L   Glucose, Bld 99  70 - 99 mg/dL   BUN 13  6 - 23 mg/dL   Creatinine, Ser 1.00  0.50 - 1.35 mg/dL   Calcium 8.3 (*) 8.4 - 10.5 mg/dL   GFR calc non Af Amer 87 (*) >90 mL/min   GFR calc Af Amer >90  >90 mL/min   Comment: (NOTE)     The eGFR has been calculated using the CKD EPI equation.     This calculation has not been validated in all clinical situations.     eGFR's persistently <90 mL/min signify possible Chronic Kidney     Disease.  OCCULT BLOOD X 1 CARD TO LAB, STOOL     Status: Abnormal   Collection Time    03/27/14  7:00 AM      Result Value Ref Range   Fecal Occult Bld POSITIVE (*) NEGATIVE  TROPONIN I     Status: Abnormal   Collection Time    03/27/14  8:20 AM      Result Value Ref Range   Troponin I 4.96 (*) <0.30 ng/mL   Comment:            Due to the release kinetics of cTnI,     a negative result within the first hours     of the onset of symptoms does not rule out     myocardial infarction with certainty.     If myocardial infarction is still suspected,     repeat the test at appropriate intervals.     CRITICAL VALUE NOTED.  VALUE IS CONSISTENT WITH PREVIOUSLY REPORTED AND CALLED VALUE.  CBC     Status: Abnormal   Collection Time    03/27/14  8:20 AM      Result Value Ref Range   WBC 8.5  4.0 - 10.5 K/uL   RBC 3.81 (*) 4.22 - 5.81 MIL/uL   Hemoglobin 11.3 (*) 13.0 - 17.0 g/dL   HCT 33.4 (*) 39.0 - 52.0 %   MCV 87.7  78.0 - 100.0 fL   MCH 29.7  26.0 - 34.0 pg   MCHC 33.8  30.0 - 36.0 g/dL   RDW 13.4  11.5 - 15.5 %  Platelets 385  150  - 400 K/uL  BASIC METABOLIC PANEL     Status: Abnormal   Collection Time    03/27/14  8:20 AM      Result Value Ref Range   Sodium 138  137 - 147 mEq/L   Potassium 3.5 (*) 3.7 - 5.3 mEq/L   Chloride 105  96 - 112 mEq/L   CO2 20  19 - 32 mEq/L   Glucose, Bld 95  70 - 99 mg/dL   BUN 12  6 - 23 mg/dL   Creatinine, Ser 0.92  0.50 - 1.35 mg/dL   Calcium 8.3 (*) 8.4 - 10.5 mg/dL   GFR calc non Af Amer >90  >90 mL/min   GFR calc Af Amer >90  >90 mL/min   Comment: (NOTE)     The eGFR has been calculated using the CKD EPI equation.     This calculation has not been validated in all clinical situations.     eGFR's persistently <90 mL/min signify possible Chronic Kidney     Disease.  HEMOGLOBIN AND HEMATOCRIT, BLOOD     Status: Abnormal   Collection Time    03/27/14  5:10 PM      Result Value Ref Range   Hemoglobin 11.0 (*) 13.0 - 17.0 g/dL   HCT 32.5 (*) 39.0 - 52.0 %   Review of Systems  Constitutional: Positive for malaise/fatigue. Negative for fever, chills, weight loss and diaphoresis.  HENT: Positive for congestion.   Eyes: Negative.   Respiratory: Positive for sputum production, shortness of breath and wheezing. Negative for cough and hemoptysis.   Cardiovascular: Positive for chest pain. Negative for palpitations, orthopnea, claudication, leg swelling and PND.  Gastrointestinal: Positive for diarrhea and blood in stool. Negative for heartburn, nausea, vomiting, abdominal pain and melena.  Genitourinary: Negative.   Musculoskeletal: Negative.   Skin: Negative.   Neurological: Positive for weakness.  Endo/Heme/Allergies: Negative.   Psychiatric/Behavioral: Negative.    Blood pressure 118/71, pulse 91, temperature 98.6 F (37 C), temperature source Oral, resp. rate 18, height 6' 0.84" (1.85 m), weight 98.5 kg (217 lb 2.5 oz), SpO2 96.00%. Physical Exam  Vitals reviewed. Constitutional: He is oriented to person, place, and time. He appears well-developed and well-nourished.   HENT:  Head: Normocephalic and atraumatic.  Eyes: Conjunctivae and EOM are normal. Pupils are equal, round, and reactive to light.  Neck: Normal range of motion. Neck supple.  Cardiovascular: Normal rate, regular rhythm and normal heart sounds.   Respiratory: Effort normal and breath sounds normal.  GI: Soft. Bowel sounds are normal. He exhibits no distension and no mass. There is no tenderness. There is no rebound and no guarding.  Musculoskeletal: Normal range of motion.  Neurological: He is alert and oriented to person, place, and time.  Skin: Skin is warm and dry.  Psychiatric: He has a normal mood and affect. His behavior is normal. Judgment and thought content normal.   Assessment/Plan: 1) Rectal bleeding after starting anticoagulation for a DES-with diarrhea [for 1 week]: Etiology unclear. Will plan a flexible sigmoidoscopy tomorrow. Will hold Imodium for now. Clear liquids after midnight tonight with enemas in AM. 2) Family history of stomach cancer-maternal grandfather.     Mylo Driskill 03/27/2014, 6:02 PM

## 2014-03-27 NOTE — Progress Notes (Signed)
Agree with plans. Will switch to plavix since bleeding tendency will be less. Agree with GI consult. Should also do ECG this AM. No plans for LAD PCI until bleeding situations cools off. I suspect colitis or diverticular disease.

## 2014-03-27 NOTE — Progress Notes (Signed)
CARDIAC REHAB PHASE I   PRE:  Rate/Rhythm: 105 ST  BP:  Supine: 137/73  Sitting:   Standing:    SaO2:   MODE:  Ambulation: 500 ft   POST:  Rate/Rhythm: 107 ST  BP:  Supine: 159/86  Sitting:   Standing:    SaO2:  0915-1010 Pt walked 500 ft on RA with steady gait. Tolerated well. No CP. Education completed except ex ed since pt for staged PCI. Pt has tried to maintain healthy status by exercise and healthy eating. 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 Last vacation about 2010. Does not get holidays or days off.    Luetta Nuttingharlene Ashanti Ratti, RN BSN  03/27/2014 10:07 AM

## 2014-03-27 NOTE — Progress Notes (Signed)
48 yr old Wm with hx of hypertriglyceridemia,MVP, asthma present with chest pain and ruled in for NSTEMI.  Pk troponin 8.20.  Cath 03/26/14: Non-ST elevation MI due to high-grade obstruction in the moderate-sized first diagonal.  2. Successful DES in the diagonal with reduction in stenosis from 99% to 0% with TIMI grade 3 flow  3. Hemodynamically significant proximal/mid LAD just beyond the origin of the treated diagonal, that appears to contain 60-80% obstruction but has a significant pressure drop/FFR of 0.62 without adenosine infusion.  4. Moderate mid circumflex with concentric 50-60% stenosis. RCA is diffusely diseased but widely patent  5. Overall normal LV function Now with continued chest pressure and bloody stools and low grade temp.   Subjective: Pt with bloody stool, obvious blood in toilet last pm and this AM. Bright blood.  Pt states diarrhea from sinus infection.  + sinus drainage down throat occuring before hospitalization.  Brilinta was to be held last pm but did receive.  Will recheck cbc now and for continued chest pressure will recheck tropinin.  Also low grade temp.  Objective: Vital signs in last 24 hours: Temp:  [98.1 F (36.7 C)-99.6 F (37.6 C)] 98.5 F (36.9 C) (04/01 0530) Pulse Rate:  [79-108] 79 (04/01 0530) Resp:  [16-20] 20 (04/01 0530) BP: (98-137)/(61-77) 124/69 mmHg (04/01 0530) SpO2:  [93 %-96 %] 95 % (04/01 0530) Weight:  [217 lb 2.5 oz (98.5 kg)] 217 lb 2.5 oz (98.5 kg) (04/01 0530) Weight change: -4 lb 13.6 oz (-2.2 kg) Last BM Date: 03/26/13 Intake/Output from previous day: +600 wt 217 down from 222 03/31 0701 - 04/01 0700 In: 1601.5 [P.O.:244; I.V.:1357.5] Out: 1001 [Urine:1000; Stool:1] Intake/Output this shift: Total I/O In: -  Out: 500 [Urine:500]  PE: General:Pleasant affect, NAD, continues with chest pain, 4 at the worst over the night now a 2. Skin:Warm and dry, brisk capillary refill HEENT:normocephalic, sclera clear, mucus  membranes moist Neck:supple, no JVD Heart:S1S2 RRR without murmur, gallup, rub or click Lungs:clear without rales, rhonchi, or wheezes ZOX:WRUE, non tender, + BS, do not palpate liver spleen or masses Ext:no lower ext edema, 2+ pedal pulses, 2+ radial pulses, brisk capillry refill, cath site rt wrist without hematoma. Neuro:alert and oriented, MAE, follows commands, + facial symmetry   EKG with deep t wave inversions in V 3  Lab Results:  Recent Labs  03/26/14 0220  03/26/14 2245 03/27/14 0252  WBC 8.7  --   --  7.8  HGB 12.9*  < > 11.1* 11.0*  HCT 37.7*  < > 32.8* 32.1*  PLT 373  --   --  375  < > = values in this interval not displayed. BMET  Recent Labs  03/25/14 0239 03/26/14 0220  NA 139 141  K 3.8 4.0  CL 103 105  CO2 21 22  GLUCOSE 106* 100*  BUN 14 14  CREATININE 1.04 1.08  CALCIUM 9.3 8.4    Recent Labs  03/25/14 1415 03/25/14 1844  TROPONINI 3.57* 2.13*    Lab Results  Component Value Date   CHOL 154 03/25/2014   HDL 38* 03/25/2014   LDLCALC 93 03/25/2014   TRIG 115 03/25/2014   CHOLHDL 4.1 03/25/2014   Lab Results  Component Value Date   HGBA1C 6.1* 03/25/2014     No results found for this basename: TSH    Hepatic Function Panel  Recent Labs  03/26/14 0220  PROT 6.3  ALBUMIN 3.2*  AST 28  ALT 30  ALKPHOS 64  BILITOT 0.5    Recent Labs  03/25/14 0827  CHOL 154   No results found for this basename: PROTIME,  in the last 72 hours     Studies/Results: 2D Echo:  Left ventricle: The cavity size was normal. Wall thickness was increased in a pattern of mild LVH. Systolic function was normal. The estimated ejection fraction was in the range of 55% to 60%. Wall motion was normal; there were no regional wall motion abnormalities  Medications: I have reviewed the patient's current medications. Scheduled Meds: . aspirin  81 mg Oral Daily  . atorvastatin  40 mg Oral q1800  . isosorbide mononitrate  30 mg Oral Daily  . metoprolol  tartrate  25 mg Oral BID  . mometasone-formoterol  2 puff Inhalation BID  . multivitamin with minerals  1 tablet Oral Daily  . omega-3 acid ethyl esters  1 g Oral Daily  . Ticagrelor  90 mg Oral Q12H  . vitamin C  250 mg Oral Daily   Continuous Infusions: . sodium chloride 150 mL/hr at 03/27/14 0139  . nitroGLYCERIN 10 mcg/min (03/27/14 0600)   PRN Meds:.acetaminophen, albuterol, loperamide, morphine, nitroGLYCERIN  Assessment/Plan: Principal Problem:  1.   ACS (acute coronary syndrome) with NSTEMI- troponins coming down on BB, statin, imdur still weaning NTG drip, recheck troponin Active Problems:   2.   CAD (coronary artery disease), native coronary artery, DES placed to 1st diag. 03/26/14; residual disease to LAD with FFR to 0.62 and RCA of 50-60%- plan for PCI to LAD,   3.   GI bleed, obvious gross blood, will get GI consult, pt has never had this before, will make NPO for now.  4.    Dyslipidemia, goal LDL below 70, on statin  5.    Sinus infection- add z pak.    LOS: 2 days   Time spent with pt. :15 minutes. [facility] Children'S HospitalNGOLD,LAURA R  Nurse Practitioner Certified Pager [phone] or after 5pm and on weekends call [phone] 03/27/2014, 6:21 AM

## 2014-03-28 ENCOUNTER — Encounter (HOSPITAL_COMMUNITY)
Admission: EM | Disposition: A | Discharge status: Home / Self Care | Payer: Self-pay | Source: Home / Self Care | Attending: Internal Medicine

## 2014-03-28 ENCOUNTER — Encounter (HOSPITAL_COMMUNITY): Payer: Self-pay | Admitting: *Deleted

## 2014-03-28 HISTORY — PX: FLEXIBLE SIGMOIDOSCOPY: SHX5431

## 2014-03-28 LAB — BASIC METABOLIC PANEL WITH GFR
BUN: 10 mg/dL (ref 6–23)
CO2: 20 meq/L (ref 19–32)
Calcium: 8 mg/dL — ABNORMAL LOW (ref 8.4–10.5)
Chloride: 105 meq/L (ref 96–112)
Creatinine, Ser: 0.92 mg/dL (ref 0.50–1.35)
GFR calc Af Amer: >90 mL/min
GFR calc non Af Amer: >90 mL/min
Glucose, Bld: 89 mg/dL (ref 70–99)
Potassium: 3.7 meq/L (ref 3.7–5.3)
Sodium: 140 meq/L (ref 137–147)

## 2014-03-28 LAB — CBC
HCT: 32 % — ABNORMAL LOW (ref 39.0–52.0)
Hemoglobin: 10.8 g/dL — ABNORMAL LOW (ref 13.0–17.0)
MCH: 29.8 pg (ref 26.0–34.0)
MCHC: 33.8 g/dL (ref 30.0–36.0)
MCV: 88.2 fL (ref 78.0–100.0)
PLATELETS: 406 10*3/uL — AB (ref 150–400)
RBC: 3.63 MIL/uL — ABNORMAL LOW (ref 4.22–5.81)
RDW: 13.5 % (ref 11.5–15.5)
WBC: 9.1 10*3/uL (ref 4.0–10.5)

## 2014-03-28 SURGERY — SIGMOIDOSCOPY, FLEXIBLE
Anesthesia: Moderate Sedation | Laterality: Left

## 2014-03-28 MED ORDER — MIDAZOLAM HCL 5 MG/ML IJ SOLN
INTRAMUSCULAR | Status: AC
  Filled 2014-03-28: qty 1

## 2014-03-28 MED ORDER — FENTANYL CITRATE 0.05 MG/ML IJ SOLN
INTRAMUSCULAR | Status: AC
  Filled 2014-03-28: qty 2

## 2014-03-28 MED ORDER — FENTANYL CITRATE 0.05 MG/ML IJ SOLN
INTRAMUSCULAR | Status: DC | PRN
  Administered 2014-03-28: 25 ug via INTRAVENOUS

## 2014-03-28 MED ORDER — ISOSORBIDE MONONITRATE ER 30 MG PO TB24: 30.0000 mg | ORAL_TABLET | Freq: Every day | ORAL | Status: DC

## 2014-03-28 MED ORDER — ASPIRIN 81 MG PO TABS: 81.0000 mg | ORAL_TABLET | Freq: Every day | ORAL | Status: DC

## 2014-03-28 MED ORDER — ATORVASTATIN CALCIUM 40 MG PO TABS: 40.0000 mg | ORAL_TABLET | Freq: Every day | ORAL | Status: DC

## 2014-03-28 MED ORDER — CLOPIDOGREL BISULFATE 75 MG PO TABS: 75.0000 mg | ORAL_TABLET | Freq: Every day | ORAL | Status: DC

## 2014-03-28 MED ORDER — AZITHROMYCIN 250 MG PO TABS: 250.0000 mg | ORAL_TABLET | Freq: Every day | ORAL | Status: AC

## 2014-03-28 MED ORDER — MIDAZOLAM HCL 10 MG/2ML IJ SOLN
INTRAMUSCULAR | Status: DC | PRN
  Administered 2014-03-28: 15:00:00 2 mg via INTRAVENOUS

## 2014-03-28 MED ORDER — NITROGLYCERIN 0.4 MG SL SUBL: 0.4000 mg | SUBLINGUAL_TABLET | SUBLINGUAL | Status: DC | PRN

## 2014-03-28 NOTE — Interval H&P Note (Signed)
History and Physical Interval Note:  03/28/2014 2:27 PM  Calvin [name]  has presented today for surgery, with the diagnosis of Rectal bleeding and diarrhea  The various methods of treatment have been discussed with the patient and family. After consideration of risks, benefits and other options for treatment, the patient has consented to  Procedure(s): FLEXIBLE SIGMOIDOSCOPY (Left) as a surgical intervention .  The patient's history has been reviewed, patient examined, no change in status, stable for surgery.  I have reviewed the patient's chart and labs.  Questions were answered to the patient's satisfaction.     Mailynn Everly D

## 2014-03-28 NOTE — Op Note (Signed)
[facility] [address] [place], [id number]   FLEXIBLE SIGMOIDOSCOPY PROCEDURE REPORT  PATIENT: Calvin [name], Calvin [name]  MR#: [id number] BIRTHDATE: 10-31-1966 , 47  yrs. old GENDER: Male ENDOSCOPIST: Jeani HawkingPatrick Dejour Vos, MD REFERRED BY: PROCEDURE DATE:  03/28/2014 PROCEDURE:   Sigmoidoscopy with biopsy ASA CLASS:   Class III INDICATIONS:rectal bleeding. MEDICATIONS: Versed 5 mg IV and Fentanyl 50 mcg IV  DESCRIPTION OF PROCEDURE:   After the risks benefits and alternatives of the procedure were thoroughly explained, informed consent was obtained.  revealed no abnormalities of the rectum. The [id number] ([id number] and [id number] ([id number])  endoscope was introduced through the anus  and advanced to the mid transverse colon , limited by No adverse events experienced.   The quality of the prep was adequate .  The instrument was then slowly withdrawn as the mucosa was fully examined.         FINDINGS: Upon initial entry there was evidence of mucus.  The rectum and sigmoid colon were cleared of the mucus and an underlying colitis was identified.  The colonoscopy was advanced to the mid transverse colon and proximal to the splenic flexure blood was identified.  In the mid transverse colon solid stool was encountered.  More washing was performed and there was more evidence of a colitis, although it was patchy.  Cold biopsies were obtained from the descending colon.  Retroflexion in the rectum suggested that there was inflammation that extended to the dentate line. Retroflexed views revealed no abnormalities.    The scope was then withdrawn from the patient and the procedure terminated.  COMPLICATIONS: There were no complications.  ENDOSCOPIC IMPRESSION: 1) Colitis - ? Infectious versus IBD.  RECOMMENDATIONS: 1) Await biopsy results.  REPEAT EXAM:   _______________________________ eSignedJeani Hawking:  Beckham Capistran, MD 03/28/2014 3:00 PM   CC:

## 2014-03-28 NOTE — H&P (View-Only) (Signed)
Unassigned patient Reason for Consult: Diarrhea with rectal bleeding. Referring Physician: Dr. Peter Jordan. PCP: James Hedrick, MD-[place], [place]  Calvin [name] is an 47 y.o. male.  HPI: 47 year old white male, with a history of hyperlipidemia, asthma and MVP, on Fenofibrate at home presented to the ER with chest pain and ruled in for a NSTEMI-had a cardiac cath with a DES placed in his diagonal and was started on Brilinta; noted to have diffuse RCA disease but the vessel was patent along with normal LV function; also noted to have a hemodynamically significant proximal-mid-LAD lesion. The evening after the stent placement, 2 hours after he got back to the room he had a bloody BM and has had 3-4 loose bloody BM's per day since then. He denies having any melena. There is no known history of PUD, jaundice or colitis. There is history of NSAID use or abuse. His diarrhea however, developed about 1 week prior to admission. He thinks he may have had some "fast food" prior to the onset of his symptoms but denies any sick contacts, recent travel or antibiotic use. He is on Zithromax in the hospital. He usually has 2-3 BM's per week and can go 3-4 days without a BM. He denies any associated abdominal pain or cramping with the diarrhea. There is no previous history of diverticulosis. He has recently had a lot of gas and bloating but denies having any chills or rigors. He has never had a colonoscopy. He denies a family history of colon cancer or IBD. He has now been switched to Plavix and has been maintained on the Aspirin.   Past Medical History  Diagnosis Date  . Hyperlipidemia   . Psoriasis   . MVP (mitral valve prolapse)     Dr Fath  . Rhinitis   . Asthmatic bronchitis   . Chronic bronchitis   . Non Q wave myocardial infarction 03/23/2014  . CAD (coronary artery disease), native coronary artery, DES placed to 1st diag. 03/26/14; residual disease to LAD with FFR to 0.62 and RCA of 50-60%- plan for PCI to LAD  03/27/2014  . ACS (acute coronary syndrome) with NSTEMI 03/25/2014  .  Chronic constipation       . Recurrent sinusitus    Past Surgical History  Procedure Laterality Date  . No past surgeries     Family History  Problem Relation Age of Onset  . Emphysema Maternal Grandmother     non smoker  . Asthma Maternal Grandmother   . Prostate cancer Paternal Grandfather   . Heart disease Mother     deceased age 23  . Heart disease Maternal Uncle     valve replaced; deceased age 52  Maternal grandfather had stomach cancer in his 80"s. There is no known family history of colon cancer.  Social History:  reports that he has never smoked. He has never used smokeless tobacco. He reports that he drinks alcohol. He reports that he does not use illicit drugs.  Allergies:  Allergies  Allergen Reactions  . Daliresp [Roflumilast]     GI UPSET   Medications: I have reviewed the patient's current medications.  Results for orders placed during the hospital encounter of 03/25/14 (from the past 48 hour(s))  TROPONIN I     Status: Abnormal   Collection Time    03/25/14  6:44 PM      Result Value Ref Range   Troponin I 2.13 (*) <0.30 ng/mL   Comment:              Due to the release kinetics of cTnI,     a negative result within the first hours     of the onset of symptoms does not rule out     myocardial infarction with certainty.     If myocardial infarction is still suspected,     repeat the test at appropriate intervals.     CRITICAL VALUE NOTED.  VALUE IS CONSISTENT WITH PREVIOUSLY REPORTED AND CALLED VALUE.  COMPREHENSIVE METABOLIC PANEL     Status: Abnormal   Collection Time    03/26/14  2:20 AM      Result Value Ref Range   Sodium 141  137 - 147 mEq/L   Potassium 4.0  3.7 - 5.3 mEq/L   Chloride 105  96 - 112 mEq/L   CO2 22  19 - 32 mEq/L   Glucose, Bld 100 (*) 70 - 99 mg/dL   BUN 14  6 - 23 mg/dL   Creatinine, Ser 1.08  0.50 - 1.35 mg/dL   Calcium 8.4  8.4 - 10.5 mg/dL   Total Protein  6.3  6.0 - 8.3 g/dL   Albumin 3.2 (*) 3.5 - 5.2 g/dL   AST 28  0 - 37 U/L   ALT 30  0 - 53 U/L   Alkaline Phosphatase 64  39 - 117 U/L   Total Bilirubin 0.5  0.3 - 1.2 mg/dL   GFR calc non Af Amer 79 (*) >90 mL/min   GFR calc Af Amer >90  >90 mL/min   Comment: (NOTE)     The eGFR has been calculated using the CKD EPI equation.     This calculation has not been validated in all clinical situations.     eGFR's persistently <90 mL/min signify possible Chronic Kidney     Disease.  HEPARIN LEVEL (UNFRACTIONATED)     Status: Abnormal   Collection Time    03/26/14  2:20 AM      Result Value Ref Range   Heparin Unfractionated 0.25 (*) 0.30 - 0.70 IU/mL   Comment:            IF HEPARIN RESULTS ARE BELOW     EXPECTED VALUES, AND PATIENT     DOSAGE HAS BEEN CONFIRMED,     SUGGEST FOLLOW UP TESTING     OF ANTITHROMBIN III LEVELS.  CBC     Status: Abnormal   Collection Time    03/26/14  2:20 AM      Result Value Ref Range   WBC 8.7  4.0 - 10.5 K/uL   RBC 4.26  4.22 - 5.81 MIL/uL   Hemoglobin 12.9 (*) 13.0 - 17.0 g/dL   HCT 37.7 (*) 39.0 - 52.0 %   MCV 88.5  78.0 - 100.0 fL   MCH 30.3  26.0 - 34.0 pg   MCHC 34.2  30.0 - 36.0 g/dL   RDW 13.4  11.5 - 15.5 %   Platelets 373  150 - 400 K/uL  POCT ACTIVATED CLOTTING TIME     Status: None   Collection Time    03/26/14  8:29 AM      Result Value Ref Range   Activated Clotting Time 487    HEMOGLOBIN AND HEMATOCRIT, BLOOD     Status: Abnormal   Collection Time    03/26/14  4:30 PM      Result Value Ref Range   Hemoglobin 12.4 (*) 13.0 - 17.0 g/dL   HCT 36.4 (*) 39.0 - 52.0 %  HEMOGLOBIN AND HEMATOCRIT,   BLOOD     Status: Abnormal   Collection Time    03/26/14 10:45 PM      Result Value Ref Range   Hemoglobin 11.1 (*) 13.0 - 17.0 g/dL   HCT 32.8 (*) 39.0 - 52.0 %  CBC     Status: Abnormal   Collection Time    03/27/14  2:52 AM      Result Value Ref Range   WBC 7.8  4.0 - 10.5 K/uL   RBC 3.66 (*) 4.22 - 5.81 MIL/uL   Hemoglobin 11.0  (*) 13.0 - 17.0 g/dL   HCT 32.1 (*) 39.0 - 52.0 %   MCV 87.7  78.0 - 100.0 fL   MCH 30.1  26.0 - 34.0 pg   MCHC 34.3  30.0 - 36.0 g/dL   RDW 13.3  11.5 - 15.5 %   Platelets 375  150 - 400 K/uL  BASIC METABOLIC PANEL     Status: Abnormal   Collection Time    03/27/14  2:52 AM      Result Value Ref Range   Sodium 139  137 - 147 mEq/L   Potassium 4.0  3.7 - 5.3 mEq/L   Chloride 105  96 - 112 mEq/L   CO2 19  19 - 32 mEq/L   Glucose, Bld 99  70 - 99 mg/dL   BUN 13  6 - 23 mg/dL   Creatinine, Ser 1.00  0.50 - 1.35 mg/dL   Calcium 8.3 (*) 8.4 - 10.5 mg/dL   GFR calc non Af Amer 87 (*) >90 mL/min   GFR calc Af Amer >90  >90 mL/min   Comment: (NOTE)     The eGFR has been calculated using the CKD EPI equation.     This calculation has not been validated in all clinical situations.     eGFR's persistently <90 mL/min signify possible Chronic Kidney     Disease.  OCCULT BLOOD X 1 CARD TO LAB, STOOL     Status: Abnormal   Collection Time    03/27/14  7:00 AM      Result Value Ref Range   Fecal Occult Bld POSITIVE (*) NEGATIVE  TROPONIN I     Status: Abnormal   Collection Time    03/27/14  8:20 AM      Result Value Ref Range   Troponin I 4.96 (*) <0.30 ng/mL   Comment:            Due to the release kinetics of cTnI,     a negative result within the first hours     of the onset of symptoms does not rule out     myocardial infarction with certainty.     If myocardial infarction is still suspected,     repeat the test at appropriate intervals.     CRITICAL VALUE NOTED.  VALUE IS CONSISTENT WITH PREVIOUSLY REPORTED AND CALLED VALUE.  CBC     Status: Abnormal   Collection Time    03/27/14  8:20 AM      Result Value Ref Range   WBC 8.5  4.0 - 10.5 K/uL   RBC 3.81 (*) 4.22 - 5.81 MIL/uL   Hemoglobin 11.3 (*) 13.0 - 17.0 g/dL   HCT 33.4 (*) 39.0 - 52.0 %   MCV 87.7  78.0 - 100.0 fL   MCH 29.7  26.0 - 34.0 pg   MCHC 33.8  30.0 - 36.0 g/dL   RDW 13.4  11.5 - 15.5 %     Platelets 385  150  - 400 K/uL  BASIC METABOLIC PANEL     Status: Abnormal   Collection Time    03/27/14  8:20 AM      Result Value Ref Range   Sodium 138  137 - 147 mEq/L   Potassium 3.5 (*) 3.7 - 5.3 mEq/L   Chloride 105  96 - 112 mEq/L   CO2 20  19 - 32 mEq/L   Glucose, Bld 95  70 - 99 mg/dL   BUN 12  6 - 23 mg/dL   Creatinine, Ser 0.92  0.50 - 1.35 mg/dL   Calcium 8.3 (*) 8.4 - 10.5 mg/dL   GFR calc non Af Amer >90  >90 mL/min   GFR calc Af Amer >90  >90 mL/min   Comment: (NOTE)     The eGFR has been calculated using the CKD EPI equation.     This calculation has not been validated in all clinical situations.     eGFR's persistently <90 mL/min signify possible Chronic Kidney     Disease.  HEMOGLOBIN AND HEMATOCRIT, BLOOD     Status: Abnormal   Collection Time    03/27/14  5:10 PM      Result Value Ref Range   Hemoglobin 11.0 (*) 13.0 - 17.0 g/dL   HCT 32.5 (*) 39.0 - 52.0 %   Review of Systems  Constitutional: Positive for malaise/fatigue. Negative for fever, chills, weight loss and diaphoresis.  HENT: Positive for congestion.   Eyes: Negative.   Respiratory: Positive for sputum production, shortness of breath and wheezing. Negative for cough and hemoptysis.   Cardiovascular: Positive for chest pain. Negative for palpitations, orthopnea, claudication, leg swelling and PND.  Gastrointestinal: Positive for diarrhea and blood in stool. Negative for heartburn, nausea, vomiting, abdominal pain and melena.  Genitourinary: Negative.   Musculoskeletal: Negative.   Skin: Negative.   Neurological: Positive for weakness.  Endo/Heme/Allergies: Negative.   Psychiatric/Behavioral: Negative.    Blood pressure 118/71, pulse 91, temperature 98.6 F (37 C), temperature source Oral, resp. rate 18, height 6' 0.84" (1.85 m), weight 98.5 kg (217 lb 2.5 oz), SpO2 96.00%. Physical Exam  Vitals reviewed. Constitutional: He is oriented to person, place, and time. He appears well-developed and well-nourished.   HENT:  Head: Normocephalic and atraumatic.  Eyes: Conjunctivae and EOM are normal. Pupils are equal, round, and reactive to light.  Neck: Normal range of motion. Neck supple.  Cardiovascular: Normal rate, regular rhythm and normal heart sounds.   Respiratory: Effort normal and breath sounds normal.  GI: Soft. Bowel sounds are normal. He exhibits no distension and no mass. There is no tenderness. There is no rebound and no guarding.  Musculoskeletal: Normal range of motion.  Neurological: He is alert and oriented to person, place, and time.  Skin: Skin is warm and dry.  Psychiatric: He has a normal mood and affect. His behavior is normal. Judgment and thought content normal.   Assessment/Plan: 1) Rectal bleeding after starting anticoagulation for a DES-with diarrhea [for 1 week]: Etiology unclear. Will plan a flexible sigmoidoscopy tomorrow. Will hold Imodium for now. Clear liquids after midnight tonight with enemas in AM. 2) Family history of stomach cancer-maternal grandfather.     Calvin [name] 03/27/2014, 6:02 PM      

## 2014-03-28 NOTE — Progress Notes (Signed)
RN spoke w/ Dr. Elnoria HowardHung who told her pt should stay overnight. With him being on Plavix and having a biopsy today, he is at increased risk of post-procedure bleeding. Dr. Elnoria HowardHung hopes the biopsy will be evaluated tomorrow to guide treatment.   Discharge is canceled for today.

## 2014-03-28 NOTE — Progress Notes (Signed)
CARDIAC REHAB PHASE I   PRE:  Rate/Rhythm: 92 SR  BP:  Supine: 147/81  Sitting:   Standing:    SaO2:   MODE:  Ambulation: 1000 ft   POST:  Rate/Rhythm: 109 ST  BP:  Supine: 144/86  Sitting:   Standing:    SaO2:  [id number] Pt walked 1000 ft with steady gait. No CP. Tolerated well. Discussed CRP 2 for pt to be thinking about for after staged PCI. Gave brochure. Encouraged pt to discuss activity with cardiologist since he is to come back for PCI. Verbally discuss with pt how we gradually increase distance. Will give written ex ed next admission.   Luetta Nuttingharlene Harley Fitzwater, RN BSN  03/28/2014 8:49 AM

## 2014-03-28 NOTE — Discharge Summary (Signed)
CARDIOLOGY DISCHARGE SUMMARY   Patient ID: Calvin [name] MRN: [id number] DOB/AGE: Feb 08, 1966 48 y.o.  Admit date: 03/25/2014 Discharge date: 03/29/2014  PCP: [name], MD Primary Cardiologist:  Dr. [name]  Primary Discharge Diagnosis:    ACS (acute coronary syndrome) with NSTEMI - Promus Premier 2.25 x 20 mm long drug-eluting stent in the first diagonal.  Secondary Discharge Diagnosis:    CAD (coronary artery disease), native coronary artery, DES placed to 1st diag. 03/26/14; residual disease to LAD with FFR to 0.62 and RCA of 50-60%- plan for PCI to LAD as OP   GI bleed - with BRBPR   Dyslipidemia, goal LDL below 70   Acute sinus infection   Anemia, felt secondary to ABL from lower GI bleed  Consults: Gastroenterology  Procedures:  1. Left heart cath; 2. Coronary angiography; 3. Left ventriculography; 4. PCI diagonal #1; 5. FFR mid LAD, 6.flexible sigmoidoscopy  Hospital Course: Calvin [name] is a 48 y.o. male with no history of CAD. Hx elevated trig, MVP, asthma. Developed chest pain, presyncope, came to the ER and ECG abnormal, initial enzymes were elevated. He was admitted for further evaluation and treatment.   His cardiac enzymes elevated indicating a non-ST segment elevation MI. He was taken to the cath lab on 3/31, results are below. He had PCI with drug-eluting stent to the first diagonal. He will return in 3-6 weeks for PCI of the LAD. He was started in Imdur, and a statin. However, no beta blocker was used because of his history of asthma.  The next day, he developed grossly bloody stools and a low-grade temperature. He also had continued chest pressure. He was felt to have a sinus infection and was started on antibiotics. The sinus drainage was likely causing the diarrhea, which had been going on for a week. His CBC showed a decrease in his blood counts but he did not require transfusion. A GI consult was called.  He was seen by Dr. Loreta Ave, who felt that a flexible  sigmoidoscopy was indicated and this was done 03/28/14 and showed colitis.   On 4/2, Calvin [name] was seen by Dr. [name]. His respiratory status was stable. His chest pain had resolved. His hemoglobin had stabilized. He was ambulating without chest pain or shortness of breath.    After discussion w/ Dr. Elnoria Howard it was decided the pt should stay overnight. With the patient being on Plavix and having a biopsy 03/28/14, he is at increased risk of post-procedure bleeding. Pathology is pending at the time of discharge 03/29/14.     He was seen by cardiac rehabilitation and educated on MI restrictions, heart-healthy lifestyle modifications and exercise guidelines. He ambulated well with them. He will follow up with cardiac rehabilitation as an outpatient. He was seen by DR Antoine Poche 03/29/14 and felt to be stable for discharge. Dr Antoine Poche notes the patient has a hemodynamically significant prox LAD lesion. He will consider elective PCI of this after he has recovered from the acute GI bleed. He'll be scheduled for a CBC in one week and an OV in two weeks.    Labs:  Lab Results  Component Value Date   WBC 9.9 03/29/2014   HGB 11.2* 03/29/2014   HCT 33.5* 03/29/2014   MCV 88.6 03/29/2014   PLT 469* 03/29/2014    Recent Labs Lab 03/26/14 0220  03/28/14 0507  NA 141  < > 140  K 4.0  < > 3.7  CL 105  < > 105  CO2 22  < >  20  BUN 14  < > 10  CREATININE 1.08  < > 0.92  CALCIUM 8.4  < > 8.0*  PROT 6.3  --   --   BILITOT 0.5  --   --   ALKPHOS 64  --   --   ALT 30  --   --   AST 28  --   --   GLUCOSE 100*  < > 89  < > = values in this interval not displayed.  Recent Labs  03/27/14 0820  TROPONINI 4.96*   Lipid Panel     Component Value Date/Time   CHOL 154 03/25/2014 0827   TRIG 115 03/25/2014 0827   HDL 38* 03/25/2014 0827   CHOLHDL 4.1 03/25/2014 0827   VLDL 23 03/25/2014 0827   LDLCALC 93 03/25/2014 0827    Pro B Natriuretic peptide (BNP)  Date/Time Value Ref Range Status  03/25/2014  2:39 AM 111.2  0  - 125 pg/mL Final    Radiology: Dg Chest Port 1 View 03/25/2014   CLINICAL DATA:  Shortness of breath and chest pain  EXAM: PORTABLE CHEST - 1 VIEW  COMPARISON:  None.  FINDINGS: Normal heart size and mediastinal contours. No acute infiltrate or edema. No effusion or pneumothorax. No acute osseous findings.  IMPRESSION: No active disease.   Electronically Signed   By: Tiburcio PeaJonathan  Watts M.D.   On: 03/25/2014 04:30    Cardiac Cath: 03/26/2014 PCI RESULTS: Successful PCI of the culprit vessel for the patient's non-ST elevation MI with reduction and 99% stenosis to 0% with TIMI grade 3 flow in the first diagonal after implantation of a 20 x 2.25 mm drug-eluting stent.  FFR without adenosine infusion demonstrated a value of 0.64 across the mid LAD suggesting that the stenosis is significant.  We decided against treatment of the LAD at this time because stenting the LAD would impact the ostium of the first diagonal and may reduce flow and therefore called stent thrombosis. The patient will return for PCI of the LAD in the next 3-6 weeks.  LEFT VENTRICULOGRAM: Left ventricular angiogram was done in the 30 RAO projection and revealed no significant wall motion abnormality with EF of 60%  IMPRESSIONS: 1. Non-ST elevation MI due to high-grade obstruction in the moderate-sized first diagonal.  2. Successful DES in the diagonal with reduction in stenosis from 99% to 0% with TIMI grade 3 flow  3. Hemodynamically significant proximal/mid LAD just beyond the origin of the treated diagonal, that appears to contain 60-80% obstruction but has a significant pressure drop/FFR of 0.62 without adenosine infusion.  4. Moderate mid circumflex with concentric 50-60% stenosis. RCA is diffusely diseased but widely patent  5. Overall normal LV function  RECOMMENDATION: Aspirin and Brilinta  Continue Bivalirudin , full dose until the current bag runs out  Ambulate later today  Wean and DC nitroglycerin later today.  Nitroglycerin was started because of presumed spasm in the diagonal causing chest discomfort post procedure. There were no associated EKG changes.  The patient will be eligible for discharge in the a.m. assuming no significant recurrence of chest pain and will be electively scheduled for LAD PCI and FFR of the circumflex in 3-6 weeks by Dr. Verdis PrimeHenry Smith.  Statin therapy, beta blocker therapy, and long-acting nitrates should be prescribed in addition to the current dual antiplatelet therapy at discharge.   EKG:  27-Mar-2014 07:10:07  Normal sinus rhythm T wave abnormality, consider inferior ischemia Vent. rate 77 BPM PR interval 166 ms QRS  duration 92 ms QT/QTc 384/434 ms P-R-T axes 38 41 -14  Echo:  03/25/2014 Study Conclusions Left ventricle: The cavity size was normal. Wall thickness was increased in a pattern of mild LVH. Systolic function was normal. The estimated ejection fraction was in the range of 55% to 60%. Wall motion was normal; there were no regional wall motion abnormalities.   FOLLOW UP PLANS AND APPOINTMENTS Allergies  Allergen Reactions  . Daliresp [Roflumilast]     GI UPSET     Medication List         albuterol 108 (90 BASE) MCG/ACT inhaler  Commonly known as:  PROAIR HFA  Inhale 2 puffs into the lungs every 6 (six) hours as needed for wheezing or shortness of breath.     aspirin 81 MG tablet  Take 1 tablet (81 mg total) by mouth daily.     atorvastatin 40 MG tablet  Commonly known as:  LIPITOR  Take 1 tablet (40 mg total) by mouth daily.     azithromycin 250 MG tablet  Commonly known as:  ZITHROMAX  Take 1 tablet (250 mg total) by mouth daily.     clopidogrel 75 MG tablet  Commonly known as:  PLAVIX  Take 1 tablet (75 mg total) by mouth daily.     DULERA 200-5 MCG/ACT Aero  Generic drug:  mometasone-formoterol  Inhale 2 puffs into the lungs 2 (two) times daily.     fenofibrate 160 MG tablet  Take 1 tablet by mouth daily.     fluticasone 50  MCG/ACT nasal spray  Commonly known as:  FLONASE  Place 1 spray into both nostrils 2 (two) times daily.     GLUCOS-CHONDROIT-MSM COMPLEX Tabs  Take 1 tablet by mouth daily.     isosorbide mononitrate 30 MG 24 hr tablet  Commonly known as:  IMDUR  Take 1 tablet (30 mg total) by mouth daily.     multivitamin with minerals Tabs tablet  Take 1 tablet by mouth daily.     nitroGLYCERIN 0.4 MG SL tablet  Commonly known as:  NITROSTAT  Place 1 tablet (0.4 mg total) under the tongue every 5 (five) minutes as needed for chest pain.     omega-3 acid ethyl esters 1 G capsule  Commonly known as:  LOVAZA  Take 1 g by mouth daily.     VITAMIN C PO  Take 1 tablet by mouth daily.        Discharge Orders   Future Appointments Provider Department Dept Phone   04/15/2014 8:30 AM Rosalio Macadamia, NP [facility] [phone]   Future Orders Complete By Expires   Diet - low sodium heart healthy  As directed    Increase activity slowly  As directed      Follow-up Information   Follow up with Norma Fredrickson, NP On 04/15/2014. (at 8:30 am)    Specialty:  Nurse Practitioner   Contact information:   [address] [place] [address] [phone]       BRING ALL MEDICATIONS WITH YOU TO FOLLOW UP APPOINTMENTS  Time spent with patient to include physician time: 38 min Signed: Abelino Derrick, PA-C 03/29/2014, 9:43 AM Co-Sign MD

## 2014-03-28 NOTE — Progress Notes (Signed)
       Patient Name: Calvin [name] Date of Encounter: 03/28/2014    SUBJECTIVE: Feels okay this AM. No chest pain. Enemas have led to clearing of blood in stools.   TELEMETRY:  NSR Filed Vitals:   03/28/14 0200 03/28/14 0300 03/28/14 0527 03/28/14 0733  BP: 123/61 128/72 128/69 147/77  Pulse:   90 82  Temp:   99.5 F (37.5 C) 98.5 F (36.9 C)  TempSrc:   Oral Oral  Resp:    18  Height:      Weight:   213 lb 13.5 oz (97 kg)   SpO2:   98% 98%    Intake/Output Summary (Last 24 hours) at 03/28/14 0733 Last data filed at 03/28/14 0600  Gross per 24 hour  Intake 1908.33 ml  Output      0 ml  Net 1908.33 ml   LABS: Basic Metabolic Panel:  Recent Labs  45/40/9803/01/10 0820 03/28/14 0507  NA 138 140  K 3.5* 3.7  CL 105 105  CO2 20 20  GLUCOSE 95 89  BUN 12 10  CREATININE 0.92 0.92  CALCIUM 8.3* 8.0*   CBC:  Recent Labs  03/27/14 0820 03/27/14 1710 03/28/14 0507  WBC 8.5  --  9.1  HGB 11.3* 11.0* 10.8*  HCT 33.4* 32.5* 32.0*  MCV 87.7  --  88.2  PLT 385  --  406*   Cardiac Enzymes:  Recent Labs  03/25/14 1415 03/25/14 1844 03/27/14 0820  TROPONINI 3.57* 2.13* 4.96*   Hemoglobin A1C:  Recent Labs  03/25/14 0827  HGBA1C 6.1*   Fasting Lipid Panel:  Recent Labs  03/25/14 0827  CHOL 154  HDL 38*  LDLCALC 93  TRIG [id number]  CHOLHDL 4.1    Radiology/Studies:  No new data  Physical Exam: Blood pressure 147/77, pulse 82, temperature 98.5 F (36.9 C), temperature source Oral, resp. rate 18, height 6' 0.84" (1.85 m), weight 213 lb 13.5 oz (97 kg), SpO2 98.00%. Weight change: -3 lb 4.9 oz (-1.5 kg)  Wt Readings from Last 3 Encounters:  03/28/14 213 lb 13.5 oz (97 kg)  03/28/14 213 lb 13.5 oz (97 kg)  03/28/14 213 lb 13.5 oz (97 kg)   Cardiac exam is unremarkable. Bowel sounds are normal. No edema.  ASSESSMENT:  1. ACS, resolved 2. GI bleed with BRBPR, resolving with enemas 3. Bronchitis and asthma, stable 4. Anemia due to acute blood loss. Hgb  from 13.6 on admission to 10.8 this AM  Plan:  1. Ambulate 2. Sigmoidoscopy this AM. 3. Possibly home today.  Selinda EonSigned, SMITH III,HENRY W 03/28/2014, 7:33 AM

## 2014-03-28 NOTE — Discharge Instructions (Signed)
PLEASE REMEMBER TO BRING ALL OF YOUR MEDICATIONS TO EACH OF YOUR FOLLOW-UP OFFICE VISITS. ° °PLEASE ATTEND ALL SCHEDULED FOLLOW-UP APPOINTMENTS.  ° °Activity: Increase activity slowly as tolerated. You may shower, but no soaking baths (or swimming) for 1 week. No driving for 2 days. No lifting over 5 lbs for 1 week. No sexual activity for 1 week.  ° °You May Return to Work: in 1 week (if applicable) ° °Wound Care: You may wash cath site gently with soap and water. Keep cath site clean and dry. If you notice pain, swelling, bleeding or pus at your cath site, please call [phone]. ° ° ° °Cardiac Cath Site Care °Refer to this sheet in the next few weeks. These instructions provide you with information on caring for yourself after your procedure. Your caregiver may also give you more specific instructions. Your treatment has been planned according to current medical practices, but problems sometimes occur. Call your caregiver if you have any problems or questions after your procedure. °HOME CARE INSTRUCTIONS °· You may shower 24 hours after the procedure. Remove the bandage (dressing) and gently wash the site with plain soap and water. Gently pat the site dry.  °· Do not apply powder or lotion to the site.  °· Do not sit in a bathtub, swimming pool, or whirlpool for 5 to 7 days.  °· No bending, squatting, or lifting anything over 10 pounds (4.5 kg) as directed by your caregiver.  °· Inspect the site at least twice daily.  °· Do not drive home if you are discharged the same day of the procedure. Have someone else drive you.  °· You may drive 24 hours after the procedure unless otherwise instructed by your caregiver.  °What to expect: °· Any bruising will usually fade within 1 to 2 weeks.  °· Blood that collects in the tissue (hematoma) may be painful to the touch. It should usually decrease in size and tenderness within 1 to 2 weeks.  °SEEK IMMEDIATE MEDICAL CARE IF: °· You have unusual pain at the site or down the  affected limb.  °· You have redness, warmth, swelling, or pain at the site.  °· You have drainage (other than a small amount of blood on the dressing).  °· You have chills.  °· You have a fever or persistent symptoms for more than 72 hours.  °· You have a fever and your symptoms suddenly get worse.  °· Your leg becomes pale, cool, tingly, or numb.  °· You have heavy bleeding from the site. Hold pressure on the site.  °Document Released: 01/15/2011 Document Revised: 12/02/2011 Document Reviewed: 01/15/2011 °Exit[employer]® Patient Information ©2012 [employer]. ° °

## 2014-03-28 NOTE — Progress Notes (Signed)
The patient has a colitis and he was switched to Plavix.  There was a good bit of blood in the colon and I am reluctant to have him discharged today.  If his bleeding declines he can be discharged home.  I think his colitis can be resolved with treatment, but I do not know the cause of his colitis at this time.

## 2014-03-29 ENCOUNTER — Other Ambulatory Visit: Payer: Self-pay | Admitting: Cardiology

## 2014-03-29 DIAGNOSIS — D649 Anemia, unspecified: Secondary | ICD-10-CM

## 2014-03-29 LAB — CBC
HCT: 33.5 % — ABNORMAL LOW (ref 39.0–52.0)
Hemoglobin: 11.2 g/dL — ABNORMAL LOW (ref 13.0–17.0)
MCH: 29.6 pg (ref 26.0–34.0)
MCHC: 33.4 g/dL (ref 30.0–36.0)
MCV: 88.6 fL (ref 78.0–100.0)
PLATELETS: 469 10*3/uL — AB (ref 150–400)
RBC: 3.78 MIL/uL — AB (ref 4.22–5.81)
RDW: 13.6 % (ref 11.5–15.5)
WBC: 9.9 10*3/uL (ref 4.0–10.5)

## 2014-03-29 NOTE — Discharge Summary (Signed)
Note reviewed, patient examined and plan for discharge with close f/u is appropriate. He knows to call if chest pain or recurrent bleeding.

## 2014-03-29 NOTE — Progress Notes (Signed)
   SUBJECTIVE:  No further chest pain since admission.  No GI  bleeding   PHYSICAL EXAM Filed Vitals:   03/29/14 0200 03/29/14 0300 03/29/14 0400 03/29/14 0428  BP: 120/71 125/68 108/51 105/51  Pulse:    80  Temp:    98.4 F (36.9 C)  TempSrc:    Oral  Resp:    18  Height:      Weight:      SpO2:    98%   General:  No distress Lungs:  Clear Heart:  RRR Abdomen:  Positive bowel sounds, no rebound no guarding Extremities:  No edema, right wrist OK.   LABS:  Results for orders placed during the hospital encounter of 03/25/14 (from the past 24 hour(s))  CBC     Status: Abnormal   Collection Time    03/29/14  5:10 AM      Result Value Ref Range   WBC 9.9  4.0 - 10.5 K/uL   RBC 3.78 (*) 4.22 - 5.81 MIL/uL   Hemoglobin 11.2 (*) 13.0 - 17.0 g/dL   HCT 19.133.5 (*) 47.839.0 - 29.552.0 %   MCV 88.6  78.0 - 100.0 fL   MCH 29.6  26.0 - 34.0 pg   MCHC 33.4  30.0 - 36.0 g/dL   RDW 62.113.6  30.811.5 - 65.715.5 %   Platelets 469 (*) 150 - 400 K/uL   No intake or output data in the 24 hours ending 03/29/14 0747  EKG:   Pending this AM.   ASSESSMENT AND PLAN:  GI bleed:  Colitis.  Infectious vs. IBD.  Results of biopsy pending.  OK with ASA and Plavix.  Hbg stable.   Risk reduction:  Continue statin for now.  I will eventually have him enrolled in the [facility].  CAD:  PCI DES to diag.  He has a hemodynamically significant prox LAD.  I will consider elective PCI of this after he has recovered from the acute GI bleed.     Fayrene FearingJames [facility] 03/29/2014 7:47 AM

## 2014-04-01 ENCOUNTER — Encounter (HOSPITAL_COMMUNITY): Payer: Self-pay | Admitting: Gastroenterology

## 2014-04-09 DIAGNOSIS — I341 Nonrheumatic mitral (valve) prolapse: Secondary | ICD-10-CM | POA: Insufficient documentation

## 2014-04-09 DIAGNOSIS — J310 Chronic rhinitis: Secondary | ICD-10-CM | POA: Insufficient documentation

## 2014-04-09 DIAGNOSIS — Z872 Personal history of diseases of the skin and subcutaneous tissue: Secondary | ICD-10-CM | POA: Insufficient documentation

## 2014-04-09 DIAGNOSIS — E785 Hyperlipidemia, unspecified: Secondary | ICD-10-CM | POA: Insufficient documentation

## 2014-04-15 ENCOUNTER — Encounter: Payer: Self-pay | Admitting: Nurse Practitioner

## 2014-04-15 ENCOUNTER — Other Ambulatory Visit: Payer: Self-pay | Admitting: Nurse Practitioner

## 2014-04-15 ENCOUNTER — Telehealth: Payer: Self-pay | Admitting: Nurse Practitioner

## 2014-04-15 ENCOUNTER — Ambulatory Visit (INDEPENDENT_AMBULATORY_CARE_PROVIDER_SITE_OTHER): Payer: Managed Care, Other (non HMO) | Admitting: Nurse Practitioner

## 2014-04-15 VITALS — BP 140/90 | HR 64 | Ht 73.0 in | Wt 205.4 lb

## 2014-04-15 DIAGNOSIS — E785 Hyperlipidemia, unspecified: Secondary | ICD-10-CM

## 2014-04-15 DIAGNOSIS — I214 Non-ST elevation (NSTEMI) myocardial infarction: Secondary | ICD-10-CM

## 2014-04-15 DIAGNOSIS — K922 Gastrointestinal hemorrhage, unspecified: Secondary | ICD-10-CM

## 2014-04-15 DIAGNOSIS — I259 Chronic ischemic heart disease, unspecified: Secondary | ICD-10-CM

## 2014-04-15 LAB — BASIC METABOLIC PANEL
BUN: 16 mg/dL (ref 6–23)
CO2: 26 mEq/L (ref 19–32)
Calcium: 9.5 mg/dL (ref 8.4–10.5)
Chloride: 107 mEq/L (ref 96–112)
Creatinine, Ser: 1 mg/dL (ref 0.4–1.5)
GFR: 86.39 mL/min (ref >60.00)
Glucose, Bld: 81 mg/dL (ref 70–99)
Potassium: 3.5 mEq/L (ref 3.5–5.1)
Sodium: 140 mEq/L (ref 135–145)

## 2014-04-15 LAB — PROTIME-INR
INR: 1.1 ratio — ABNORMAL HIGH (ref 0.8–1.0)
Prothrombin Time: 12.1 s (ref 9.6–13.1)

## 2014-04-15 LAB — CBC
HCT: 37.7 % — ABNORMAL LOW (ref 39.0–52.0)
Hemoglobin: 12.4 g/dL — ABNORMAL LOW (ref 13.0–17.0)
MCHC: 32.9 g/dL (ref 30.0–36.0)
MCV: 90.3 fl (ref 78.0–100.0)
Platelets: 462 10*3/uL — ABNORMAL HIGH (ref 150.0–400.0)
RBC: 4.17 Mil/uL — ABNORMAL LOW (ref 4.22–5.81)
RDW: 14.3 % (ref 11.5–14.6)
WBC: 10.8 10*3/uL — ABNORMAL HIGH (ref 4.5–10.5)

## 2014-04-15 LAB — APTT: aPTT: 27.2 s (ref 21.7–28.8)

## 2014-04-15 NOTE — Progress Notes (Signed)
[name] Date of Birth: 10/13/1966 Medical Record #[id number]  History of Present Illness: [name] is seen back today for a post hospital visit. Seen for Dr. Katrinka BlazingSmith. He is a 48 year old male with history of HLD, MVP and asthma.  Most recently presented to the ER with chest pain and presyncope. Had NSTEMI and was cathed by Dr. Katrinka BlazingSmith - had Promus DES to the 1st DX - this was a long DES at 2.25 x 20 mm. Has residual disease of hemodynamic significance in the LAD and had FFR of 0.62 and disease in the RCA of 50 to 60% - plan for PCI to the LAD as outpatient - 3 to 6 weeks post MI. Hospitalization was complicated by GI bleed - noted to have colitis on flex sig by Dr. Loreta AveMann - etiology unknown. Also had a sinus infection. No beta blocker due to history of asthma. He is on Plavix. EF was normal by echo.   Comes in today. Here with his wife. He feels "great". No chest pain. Not short of breath. Not dizzy or lightheaded. No bleeding. He has seen a new ENT and was placed on antibiotics and steroids. Finishes his steroids in a day or so and the antibiotics in about 2 weeks. He has had no more gut issues.   Current Outpatient Prescriptions  Medication Sig Dispense Refill  . albuterol (PROAIR HFA) 108 (90 BASE) MCG/ACT inhaler Inhale 2 puffs into the lungs every 6 (six) hours as needed for wheezing or shortness of breath.  1 Inhaler  prn  . Ascorbic Acid (VITAMIN C PO) Take 1 tablet by mouth daily.      Marland Kitchen. aspirin 81 MG tablet Take 1 tablet (81 mg total) by mouth daily.      Marland Kitchen. atorvastatin (LIPITOR) 40 MG tablet Take 1 tablet (40 mg total) by mouth daily.  30 tablet  11  . clopidogrel (PLAVIX) 75 MG tablet Take 1 tablet (75 mg total) by mouth daily.  30 tablet  11  . doxycycline (VIBRAMYCIN) 100 MG capsule Take 100 mg by mouth 2 (two) times daily.       . fenofibrate 160 MG tablet Take 1 tablet by mouth daily.      . fluticasone (FLONASE) 50 MCG/ACT nasal spray Place 1 spray into both nostrils 2 (two)  times daily.      . isosorbide mononitrate (IMDUR) 30 MG 24 hr tablet Take 1 tablet (30 mg total) by mouth daily.  30 tablet  11  . Misc Natural Products (GLUCOS-CHONDROIT-MSM COMPLEX) TABS Take 1 tablet by mouth daily.      . mometasone-formoterol (DULERA) 200-5 MCG/ACT AERO Inhale 2 puffs into the lungs 2 (two) times daily.      . Multiple Vitamin (MULTIVITAMIN WITH MINERALS) TABS tablet Take 1 tablet by mouth daily.      . nitroGLYCERIN (NITROSTAT) 0.4 MG SL tablet Place 1 tablet (0.4 mg total) under the tongue every 5 (five) minutes as needed for chest pain.  25 tablet  3  . omega-3 acid ethyl esters (LOVAZA) 1 G capsule Take 1 g by mouth daily.      . predniSONE (DELTASONE) 10 MG tablet Take 10 mg by mouth daily with breakfast.        No current facility-administered medications for this visit.    Allergies  Allergen Reactions  . Daliresp [Roflumilast]     GI UPSET    Past Medical History  Diagnosis Date  . Hyperlipemia   . Psoriasis   .  MVP (mitral valve prolapse)     Dr [name]  . Rhinitis   . Asthmatic bronchitis   . Chronic bronchitis   . Non Q wave myocardial infarction 03/23/2014  . CAD (coronary artery disease), native coronary artery, DES placed to 1st diag. 03/26/14; residual disease to LAD with FFR to 0.62 and RCA of 50-60%- plan for PCI to LAD 03/27/2014  . ACS (acute coronary syndrome) with NSTEMI 03/25/2014  . GI bleed 03/27/2014  . Dyslipidemia, goal LDL below 70 03/27/2014  . Acute sinus infection 03/27/2014    Past Surgical History  Procedure Laterality Date  . No past surgeries    . Flexible sigmoidoscopy Left 03/28/2014    Procedure: FLEXIBLE SIGMOIDOSCOPY;  Surgeon: [name], MD;  Location: [facility] ENDOSCOPY;  Service: Endoscopy;  Laterality: Left;    History  Smoking status  . Never Smoker   Smokeless tobacco  . Never Used    History  Alcohol Use  . Yes    Comment: 03/25/2014 "less than 6 pack beer/4 months"    Family History  Problem Relation Age of  Onset  . Emphysema Maternal Grandmother     non smoker  . Asthma Maternal Grandmother   . Prostate cancer Paternal Grandfather   . Heart disease Mother     deceased age 75  . Heart disease Maternal Uncle     valve replaced; deceased age 76    Review of Systems: The review of systems is per the HPI.  All other systems were reviewed and are negative.  Physical Exam: BP 140/90  Pulse 64  Ht 6\' 1"  (1.854 m)  Wt 205 lb 6.4 oz (93.169 kg)  BMI 27.11 kg/m2 Patient is very pleasant and in no acute distress. Skin is warm and dry. Color is looks pale.  HEENT is unremarkable. Normocephalic/atraumatic. PERRL. Sclera are nonicteric. Neck is supple. No masses. No JVD. Lungs are clear. Cardiac exam shows a regular rate and rhythm. Abdomen is soft. Extremities are without edema. Gait and ROM are intact. No gross neurologic deficits noted. His prior cath site of the right wrist is stable.  LABORATORY DATA: PENDING  Lab Results  Component Value Date   WBC 9.9 03/29/2014   HGB 11.2* 03/29/2014   HCT 33.5* 03/29/2014   PLT 469* 03/29/2014   GLUCOSE 89 03/28/2014   CHOL 154 03/25/2014   TRIG 115 03/25/2014   HDL 38* 03/25/2014   LDLCALC 93 03/25/2014   ALT 30 03/26/2014   AST 28 03/26/2014   NA 140 03/28/2014   K 3.7 03/28/2014   CL 105 03/28/2014   CREATININE 0.92 03/28/2014   BUN 10 03/28/2014   CO2 20 03/28/2014   HGBA1C 6.1* 03/25/2014   PROCEDURE: 1. Left heart cath; 2. Coronary angiography; 3. Left ventriculography; 4. PCI diagonal #1; 5. FFR mid LAD  CONSENT:  The risks, benefits, and details of the procedure were explained in detail to the patient. Risks including death, stroke, heart attack, kidney injury, allergy, limb ischemia, bleeding and radiation injury were discussed. The patient verbalized understanding and wanted to proceed. Informed written consent was obtained.  PROCEDURE TECHNIQUE: After Xylocaine anesthesia a 5 French Slender sheath was placed in the right radial artery with a single anterior  needle wall stick. Coronary angiography was done using a 5 [place] JR 4 and JL 3.5 cm diagnostic catheter. Left ventriculography was done using the JR 4 diagnostic catheter and hand injection.  Diagonal #1 was obviously the cause of the patient's non-ST elevation MI.  Segmental diffuse disease was noted up to 95% The LAD starting at the origin og the diagonal and extending to mid vessel was eccentric and 60-80% narrowed.  After discussing the findings with the patient and his wife we proceeded with the plan for PCI of the diagonal an FFR of the LAD.  We proceeded with PCI on the culprit vessel the first. We administered a bivalirudin bolus and infusion to get an ACT that was greater than 300. Brilinta was administered as a180 mg loading dose. We used a 6 French 3.5 cm XB LAD, a Pro-water wire, and a 15 x 2.0 mm balloon. Multiple overlapping balloon inflations were performed. We positioned and deployed a Promus Premier 2.25 x 20 mm long drug-eluting stent to 12 atmospheres. Postdilatation was performed with a 2.25 x 12 mm [place] balloon to 15 atmospheres. TIMI grade 3 flow was noted post. No complications occurred.  We then turned our attention to the LAD. FFR was performed using a Prime Wire. Without adenosine infusion the FFR was 0.62. I was somewhat surprised by this. We then decided to perform PCI on the LAD. After laying out the LAD it became obvious to me that the stent would likely extend at least to the diagonal and may even need to cover the ostium of the diagonal. This could cause reduced flow and stent occlusion in the diagonal. I made the decision to postpone LAD PCI until the patient has had a 3-6 week healing in the previously stented diagonal. We will have him to come back for LAD stenting within the next 4 weeks.  Post procedure the patient had 5/10 chest discomfort without EKG changes. TIMI grade 3 flow was noted in all territories. IV nitroglycerin was started. The discomfort gradually resolved. It  is felt that the discomfort was likely related to spasm in the diagonal territory.  MEDICATIONS: Fentanyl 100 mcg; Versed 2 mg IV  CONTRAST: Total of 250 cc.  COMPLICATIONS: None  HEMODYNAMICS: Aortic pressure 110/73 mmHg ; LV pressure 123/3 mmHg 12 mmHg  ANGIOGRAPHIC DATA: The left main coronary artery is calcified but widely patent.  The left anterior descending artery is dominant. Gives origin to a moderate sized first diagonal contains 95-99% segmental stenosis in the proximal segment. The LAD itself contains intermediate stenosis starting just distal to the origin of the diagonal and extending into the mid vessel obstructing up to 80%..  The left circumflex artery is large and bifurcates on the lateral wall. The mid vessel contains eccentric 50-60% narrowing.  The right coronary artery is calcified and widely patent. Diffuse luminal irregularity is noted.Marland Kitchen  PCI RESULTS: Successful PCI of the culprit vessel for the patient's non-ST elevation MI with reduction and 99% stenosis to 0% with TIMI grade 3 flow in the first diagonal after implantation of a 20 x 2.25 mm drug-eluting stent.  FFR without adenosine infusion demonstrated a value of 0.64 across the mid LAD suggesting that the stenosis is significant.  We decided against treatment of the LAD at this time because stenting the LAD would impact the ostium of the first diagonal and may reduce flow and therefore called stent thrombosis. The patient will return for PCI of the LAD in the next 3-6 weeks.  LEFT VENTRICULOGRAM: Left ventricular angiogram was done in the 30 RAO projection and revealed no significant wall motion abnormality with EF of 60%  IMPRESSIONS: 1. Non-ST elevation MI due to high-grade obstruction in the moderate-sized first diagonal.  2. Successful DES in the diagonal with reduction  in stenosis from 99% to 0% with TIMI grade 3 flow  3. Hemodynamically significant proximal/mid LAD just beyond the origin of the treated diagonal,  that appears to contain 60-80% obstruction but has a significant pressure drop/FFR of 0.62 without adenosine infusion.  4. Moderate mid circumflex with concentric 50-60% stenosis. RCA is diffusely diseased but widely patent  5. Overall normal LV function  RECOMMENDATION: Aspirin and Brilinta  Continue Bivalirudin , full dose until the current bag runs out  Ambulate later today  Wean and DC nitroglycerin later today. Nitroglycerin was started because of presumed spasm in the diagonal causing chest discomfort post procedure. There were no associated EKG changes.  The patient will be eligible for discharge in the a.m. assuming no significant recurrence of chest pain and will be electively scheduled for LAD PCI and FFR of the circumflex in 3-6 weeks by Dr. Verdis PrimeHenry Smith.  Statin therapy, beta blocker therapy, and long-acting nitrates should be prescribed in addition to the current dual antiplatelet therapy at discharge.  .  Echo Study Conclusions  Left ventricle: The cavity size was normal. Wall thickness was increased in a pattern of mild LVH. Systolic function was normal. The estimated ejection fraction was in the range of 55% to 60%. Wall motion was normal; there were no regional wall motion abnormalities.   Assessment / Plan: 1. Recent NSTEMI with DES to the DX - he is feeling well and has no symptoms.   2. Residual disease in the LAD - needs PCI - will arrange for PCI with Dr. Katrinka BlazingSmith. Scheduled for tomorrow.   3. Residual disease in the RCA - manage medically  4. GI bleed - has colitis - etiology not known - remains on Plavix - needs CBC checked today as well. He notes that the steroids he has received for his sinusitis has resolved his issues with his GI tract. No bleeding noted.   5. HLD - on statin  6. Asthma  7. HTN - BP borderline - would consider ACE if needed - would readdress after his PCI  Check labs today. PCI tomorrow with Dr. Katrinka BlazingSmith.   Patient is agreeable to this plan  and will call if any problems develop in the interim.   Rosalio MacadamiaLori C. Sonnie Bias, RN, ANP-C [facility] [address] [place], [place]  [id number] [phone]

## 2014-04-15 NOTE — Telephone Encounter (Signed)
New message     Returned a nurses call.  Saw Calvin [name] today

## 2014-04-15 NOTE — Patient Instructions (Addendum)
Stay on your current medicines  We will arrange for repeat cath/PCI with Dr. Katrinka BlazingSmith to open up the other blockage that you have - for tomorrow  You need lab work today  You are scheduled for a cardiac catheterization tomorrow April 21st at 12 noon with Dr. Katrinka BlazingSmith or associate.  [facility] Tuesday, April 21st at 10 am .  Enter thru the [employer] [facility] entrance A No food or drink after midnight tonight . You may take your medications with a sip of water on the day of your procedure.   Coronary Angiography with Stent Coronary angiography with stent placement is a procedure to widen or open a narrow blood vessel of the heart (coronary artery). When a coronary artery becomes partially blocked, it decreases blood flow to that area. This may lead to chest pain or a heart attack (myocardial infarction). Arteries may become blocked by cholesterol buildup (plaque) in the lining or wall.  A stent is a small piece of metal that looks like a mesh or a spring. Stent placement may be done right after a coronary angiography in which a blocked artery is found or as a treatment for a heart attack.  LET [facility] PROVIDER KNOW ABOUT:  Any allergies you have.   All medicines you are taking, including vitamins, herbs, eye drops, creams, and over-the-counter medicines.   Previous problems you or members of your family have had with the use of anesthetics.   Any blood disorders you have.   Previous surgeries you have had.   Medical conditions you have. RISKS AND COMPLICATIONS Generally, coronary angiography with stent is a safe procedure. However, as with any procedure, complications can occur. Possible complications include:   Damage to the heart or its blood vessels.   A return of blockage.   Bleeding at the site.   Blood clot in another part of the body.   Kidney injury.   Allergic reaction to the dye or contrast used.  BEFORE THE PROCEDURE  Do not  eat or drink anything for 6 hours before the procedure.   Ask your health care provider if medicines can be taken with a sip of water.   Your health care provider will make sure you understand the procedure and the risks and potential complications associated with the procedure.  PROCEDURE  You may be given a medicine to help you relax before and during the procedure (sedative). This medicine will be given through an IV tube that is put into one of your veins.   The area where the catheter will be inserted is shaved and cleaned. This is usually done in the groin but may be done in the fold of your arm (near your elbow) or in the wrist.   A medicine will be given to numb the area where the catheter will be inserted (local anesthetic).   The catheter is inserted into an artery using a guide wire. A type of X-ray (fluoroscopy) is used to help guide the catheter to the opening of the blocked artery.   A dye is then injected into the catheter, and X-rays are taken. The dye helps to show where any narrowing or blockages are located in the heart arteries.   A tiny wire is guided to the blocked spot, and a balloon is inflated to make the artery wider. The stent is expanded and crushes the plaque into the wall of the vessel. The stent holds the area open like a  scaffolding and improves the blood flow.   Sometimes the artery may be made wider using a laser or other tools to remove plaque.   When the blood flow is better, the catheter is removed. The lining of the artery will grow over the stent, which stays where it was placed.  AFTER THE PROCEDURE  If the procedure is done through the leg, you will be kept in bed lying flat for about 6 hours. You will be instructed to not bend or cross your legs.   The insertion site will be checked frequently.   The pulse in your feet or wrist will be checked frequently.   Additional blood tests, X-rays, and electrocardiography may be  done. Document Released: 06/19/2003 Document Revised: 10/03/2013 Document Reviewed: 06/21/2013 [facility] Patient Information 2014 [employer], [place].

## 2014-04-16 ENCOUNTER — Ambulatory Visit (HOSPITAL_COMMUNITY)
Admission: RE | Admit: 2014-04-16 | Discharge: 2014-04-17 | Disposition: A | Discharge status: Home / Self Care | Payer: Managed Care, Other (non HMO) | Source: Ambulatory Visit | Attending: Interventional Cardiology | Admitting: Interventional Cardiology

## 2014-04-16 ENCOUNTER — Encounter (HOSPITAL_COMMUNITY)
Admission: RE | Disposition: A | Discharge status: Home / Self Care | Payer: Managed Care, Other (non HMO) | Source: Ambulatory Visit | Attending: Interventional Cardiology

## 2014-04-16 ENCOUNTER — Encounter (HOSPITAL_COMMUNITY): Payer: Self-pay | Admitting: General Practice

## 2014-04-16 DIAGNOSIS — I214 Non-ST elevation (NSTEMI) myocardial infarction: Secondary | ICD-10-CM

## 2014-04-16 DIAGNOSIS — IMO0002 Reserved for concepts with insufficient information to code with codable children: Secondary | ICD-10-CM | POA: Insufficient documentation

## 2014-04-16 DIAGNOSIS — I209 Angina pectoris, unspecified: Secondary | ICD-10-CM

## 2014-04-16 DIAGNOSIS — Z7982 Long term (current) use of aspirin: Secondary | ICD-10-CM | POA: Insufficient documentation

## 2014-04-16 DIAGNOSIS — Z7902 Long term (current) use of antithrombotics/antiplatelets: Secondary | ICD-10-CM | POA: Insufficient documentation

## 2014-04-16 DIAGNOSIS — I059 Rheumatic mitral valve disease, unspecified: Secondary | ICD-10-CM | POA: Insufficient documentation

## 2014-04-16 DIAGNOSIS — E785 Hyperlipidemia, unspecified: Secondary | ICD-10-CM | POA: Insufficient documentation

## 2014-04-16 DIAGNOSIS — I259 Chronic ischemic heart disease, unspecified: Secondary | ICD-10-CM

## 2014-04-16 DIAGNOSIS — I200 Unstable angina: Secondary | ICD-10-CM | POA: Insufficient documentation

## 2014-04-16 DIAGNOSIS — I2510 Atherosclerotic heart disease of native coronary artery without angina pectoris: Secondary | ICD-10-CM

## 2014-04-16 HISTORY — DX: Unspecified asthma, uncomplicated: J45.909

## 2014-04-16 HISTORY — PX: PERCUTANEOUS CORONARY STENT INTERVENTION (PCI-S): SHX5485

## 2014-04-16 LAB — POCT ACTIVATED CLOTTING TIME: Activated Clotting Time: 387 seconds

## 2014-04-16 SURGERY — PERCUTANEOUS CORONARY STENT INTERVENTION (PCI-S)
Anesthesia: LOCAL

## 2014-04-16 MED ORDER — BIVALIRUDIN 250 MG IV SOLR
INTRAVENOUS | Status: AC
  Filled 2014-04-16: qty 250

## 2014-04-16 MED ORDER — LIDOCAINE HCL (PF) 1 % IJ SOLN
INTRAMUSCULAR | Status: AC
  Filled 2014-04-16: qty 30

## 2014-04-16 MED ORDER — PREDNISONE 10 MG PO TABS
10.0000 mg | ORAL_TABLET | Freq: Every day | ORAL | Status: DC
  Administered 2014-04-17: 10 mg via ORAL
  Filled 2014-04-16 (×2): qty 1

## 2014-04-16 MED ORDER — OMEGA-3-ACID ETHYL ESTERS 1 G PO CAPS
1.0000 g | ORAL_CAPSULE | Freq: Every day | ORAL | Status: DC
  Administered 2014-04-17: 1 g via ORAL
  Filled 2014-04-16: qty 1

## 2014-04-16 MED ORDER — NITROGLYCERIN 0.4 MG SL SUBL: 0.4000 mg | SUBLINGUAL_TABLET | SUBLINGUAL | Status: DC | PRN

## 2014-04-16 MED ORDER — CLOPIDOGREL BISULFATE 75 MG PO TABS
75.0000 mg | ORAL_TABLET | Freq: Every day | ORAL | Status: DC
  Administered 2014-04-17: 75 mg via ORAL
  Filled 2014-04-16: qty 1

## 2014-04-16 MED ORDER — SODIUM CHLORIDE 0.9 % IV SOLN
INTRAVENOUS | Status: AC
  Administered 2014-04-16 (×2): 100 mL/h via INTRAVENOUS

## 2014-04-16 MED ORDER — OXYCODONE-ACETAMINOPHEN 5-325 MG PO TABS: 1.0000 | ORAL_TABLET | ORAL | Status: DC | PRN

## 2014-04-16 MED ORDER — ATORVASTATIN CALCIUM 40 MG PO TABS
40.0000 mg | ORAL_TABLET | Freq: Every day | ORAL | Status: DC
  Filled 2014-04-16: qty 1

## 2014-04-16 MED ORDER — DIAZEPAM 5 MG PO TABS
10.0000 mg | ORAL_TABLET | ORAL | Status: AC
Start: 2014-04-16 — End: 2014-04-16
  Administered 2014-04-16: 10 mg via ORAL

## 2014-04-16 MED ORDER — HEPARIN (PORCINE) IN NACL 2-0.9 UNIT/ML-% IJ SOLN
INTRAMUSCULAR | Status: AC
  Filled 2014-04-16: qty 1000

## 2014-04-16 MED ORDER — ISOSORBIDE MONONITRATE ER 30 MG PO TB24
30.0000 mg | ORAL_TABLET | Freq: Every day | ORAL | Status: DC
  Administered 2014-04-17: 11:00:00 30 mg via ORAL
  Filled 2014-04-16: qty 1

## 2014-04-16 MED ORDER — NITROGLYCERIN IN D5W 200-5 MCG/ML-% IV SOLN
INTRAVENOUS | Status: AC
  Filled 2014-04-16: qty 250

## 2014-04-16 MED ORDER — ASPIRIN EC 81 MG PO TBEC
81.0000 mg | DELAYED_RELEASE_TABLET | Freq: Every day | ORAL | Status: DC
  Administered 2014-04-17: 81 mg via ORAL
  Filled 2014-04-16 (×2): qty 1

## 2014-04-16 MED ORDER — VERAPAMIL HCL 2.5 MG/ML IV SOLN
INTRAVENOUS | Status: AC
Start: 2014-04-16 — End: 2014-04-16
  Filled 2014-04-16: qty 2

## 2014-04-16 MED ORDER — SODIUM CHLORIDE 0.9 % IJ SOLN: 3.0000 mL | INTRAMUSCULAR | Status: DC | PRN

## 2014-04-16 MED ORDER — DIAZEPAM 5 MG PO TABS
ORAL_TABLET | ORAL | Status: AC
  Filled 2014-04-16: qty 2

## 2014-04-16 MED ORDER — NITROGLYCERIN IN D5W 200-5 MCG/ML-% IV SOLN: 10.0000 ug/min | INTRAVENOUS | Status: DC

## 2014-04-16 MED ORDER — FENTANYL CITRATE 0.05 MG/ML IJ SOLN
INTRAMUSCULAR | Status: AC
  Filled 2014-04-16: qty 2

## 2014-04-16 MED ORDER — MIDAZOLAM HCL 2 MG/2ML IJ SOLN
INTRAMUSCULAR | Status: AC
  Filled 2014-04-16: qty 2

## 2014-04-16 MED ORDER — SODIUM CHLORIDE 0.9 % IV SOLN: 250.0000 mL | INTRAVENOUS | Status: DC | PRN

## 2014-04-16 MED ORDER — ALBUTEROL SULFATE HFA 108 (90 BASE) MCG/ACT IN AERS: 2.0000 | INHALATION_SPRAY | Freq: Four times a day (QID) | RESPIRATORY_TRACT | Status: DC | PRN

## 2014-04-16 MED ORDER — MOMETASONE FURO-FORMOTEROL FUM 200-5 MCG/ACT IN AERO
2.0000 | INHALATION_SPRAY | Freq: Two times a day (BID) | RESPIRATORY_TRACT | Status: DC
  Administered 2014-04-16 – 2014-04-17 (×2): 2 via RESPIRATORY_TRACT
  Filled 2014-04-16: qty 8.8

## 2014-04-16 MED ORDER — ASPIRIN 81 MG PO CHEW
81.0000 mg | CHEWABLE_TABLET | ORAL | Status: DC
Start: 2014-04-17 — End: 2014-04-16

## 2014-04-16 MED ORDER — SODIUM CHLORIDE 0.9 % IJ SOLN: 3.0000 mL | Freq: Two times a day (BID) | INTRAMUSCULAR | Status: DC

## 2014-04-16 MED ORDER — FLUTICASONE PROPIONATE 50 MCG/ACT NA SUSP
1.0000 | Freq: Two times a day (BID) | NASAL | Status: DC
  Administered 2014-04-17: 11:00:00 1 via NASAL
  Filled 2014-04-16: qty 16

## 2014-04-16 MED ORDER — NITROGLYCERIN 0.2 MG/ML ON CALL CATH LAB
INTRAVENOUS | Status: AC
  Filled 2014-04-16: qty 1

## 2014-04-16 MED ORDER — ADULT MULTIVITAMIN W/MINERALS CH
1.0000 | ORAL_TABLET | Freq: Every day | ORAL | Status: DC
  Administered 2014-04-17: 11:00:00 1 via ORAL
  Filled 2014-04-16: qty 1

## 2014-04-16 MED ORDER — ALBUTEROL SULFATE (2.5 MG/3ML) 0.083% IN NEBU: 2.5000 mg | INHALATION_SOLUTION | Freq: Four times a day (QID) | RESPIRATORY_TRACT | Status: DC | PRN

## 2014-04-16 MED ORDER — HEPARIN SODIUM (PORCINE) 1000 UNIT/ML IJ SOLN
INTRAMUSCULAR | Status: AC
  Filled 2014-04-16: qty 1

## 2014-04-16 MED ORDER — DOXYCYCLINE HYCLATE 100 MG PO TABS
100.0000 mg | ORAL_TABLET | Freq: Two times a day (BID) | ORAL | Status: DC
  Administered 2014-04-16 – 2014-04-17 (×2): 100 mg via ORAL
  Filled 2014-04-16 (×3): qty 1

## 2014-04-16 MED ORDER — SODIUM CHLORIDE 0.9 % IV SOLN
INTRAVENOUS | Status: DC
  Administered 2014-04-16: 11:00:00 via INTRAVENOUS

## 2014-04-16 MED ORDER — FENOFIBRATE 160 MG PO TABS
160.0000 mg | ORAL_TABLET | Freq: Every day | ORAL | Status: DC
  Administered 2014-04-17: 11:00:00 160 mg via ORAL
  Filled 2014-04-16: qty 1

## 2014-04-16 NOTE — H&P (View-Only) (Signed)
[name] Date of Birth: 10/13/1966 Medical Record #[id number]  History of Present Illness: [name] is seen back today for a post hospital visit. Seen for Dr. Katrinka BlazingSmith. He is a 48 year old male with history of HLD, MVP and asthma.  Most recently presented to the ER with chest pain and presyncope. Had NSTEMI and was cathed by Dr. Katrinka BlazingSmith - had Promus DES to the 1st DX - this was a long DES at 2.25 x 20 mm. Has residual disease of hemodynamic significance in the LAD and had FFR of 0.62 and disease in the RCA of 50 to 60% - plan for PCI to the LAD as outpatient - 3 to 6 weeks post MI. Hospitalization was complicated by GI bleed - noted to have colitis on flex sig by Dr. Loreta AveMann - etiology unknown. Also had a sinus infection. No beta blocker due to history of asthma. He is on Plavix. EF was normal by echo.   Comes in today. Here with his wife. He feels "great". No chest pain. Not short of breath. Not dizzy or lightheaded. No bleeding. He has seen a new ENT and was placed on antibiotics and steroids. Finishes his steroids in a day or so and the antibiotics in about 2 weeks. He has had no more gut issues.   Current Outpatient Prescriptions  Medication Sig Dispense Refill  . albuterol (PROAIR HFA) 108 (90 BASE) MCG/ACT inhaler Inhale 2 puffs into the lungs every 6 (six) hours as needed for wheezing or shortness of breath.  1 Inhaler  prn  . Ascorbic Acid (VITAMIN C PO) Take 1 tablet by mouth daily.      Marland Kitchen. aspirin 81 MG tablet Take 1 tablet (81 mg total) by mouth daily.      Marland Kitchen. atorvastatin (LIPITOR) 40 MG tablet Take 1 tablet (40 mg total) by mouth daily.  30 tablet  11  . clopidogrel (PLAVIX) 75 MG tablet Take 1 tablet (75 mg total) by mouth daily.  30 tablet  11  . doxycycline (VIBRAMYCIN) 100 MG capsule Take 100 mg by mouth 2 (two) times daily.       . fenofibrate 160 MG tablet Take 1 tablet by mouth daily.      . fluticasone (FLONASE) 50 MCG/ACT nasal spray Place 1 spray into both nostrils 2 (two)  times daily.      . isosorbide mononitrate (IMDUR) 30 MG 24 hr tablet Take 1 tablet (30 mg total) by mouth daily.  30 tablet  11  . Misc Natural Products (GLUCOS-CHONDROIT-MSM COMPLEX) TABS Take 1 tablet by mouth daily.      . mometasone-formoterol (DULERA) 200-5 MCG/ACT AERO Inhale 2 puffs into the lungs 2 (two) times daily.      . Multiple Vitamin (MULTIVITAMIN WITH MINERALS) TABS tablet Take 1 tablet by mouth daily.      . nitroGLYCERIN (NITROSTAT) 0.4 MG SL tablet Place 1 tablet (0.4 mg total) under the tongue every 5 (five) minutes as needed for chest pain.  25 tablet  3  . omega-3 acid ethyl esters (LOVAZA) 1 G capsule Take 1 g by mouth daily.      . predniSONE (DELTASONE) 10 MG tablet Take 10 mg by mouth daily with breakfast.        No current facility-administered medications for this visit.    Allergies  Allergen Reactions  . Daliresp [Roflumilast]     GI UPSET    Past Medical History  Diagnosis Date  . Hyperlipemia   . Psoriasis   .  MVP (mitral valve prolapse)     Dr [name]  . Rhinitis   . Asthmatic bronchitis   . Chronic bronchitis   . Non Q wave myocardial infarction 03/23/2014  . CAD (coronary artery disease), native coronary artery, DES placed to 1st diag. 03/26/14; residual disease to LAD with FFR to 0.62 and RCA of 50-60%- plan for PCI to LAD 03/27/2014  . ACS (acute coronary syndrome) with NSTEMI 03/25/2014  . GI bleed 03/27/2014  . Dyslipidemia, goal LDL below 70 03/27/2014  . Acute sinus infection 03/27/2014    Past Surgical History  Procedure Laterality Date  . No past surgeries    . Flexible sigmoidoscopy Left 03/28/2014    Procedure: FLEXIBLE SIGMOIDOSCOPY;  Surgeon: [name], MD;  Location: [facility] ENDOSCOPY;  Service: Endoscopy;  Laterality: Left;    History  Smoking status  . Never Smoker   Smokeless tobacco  . Never Used    History  Alcohol Use  . Yes    Comment: 03/25/2014 "less than 6 pack beer/4 months"    Family History  Problem Relation Age of  Onset  . Emphysema Maternal Grandmother     non smoker  . Asthma Maternal Grandmother   . Prostate cancer Paternal Grandfather   . Heart disease Mother     deceased age 75  . Heart disease Maternal Uncle     valve replaced; deceased age 76    Review of Systems: The review of systems is per the HPI.  All other systems were reviewed and are negative.  Physical Exam: BP 140/90  Pulse 64  Ht 6\' 1"  (1.854 m)  Wt 205 lb 6.4 oz (93.169 kg)  BMI 27.11 kg/m2 Patient is very pleasant and in no acute distress. Skin is warm and dry. Color is looks pale.  HEENT is unremarkable. Normocephalic/atraumatic. PERRL. Sclera are nonicteric. Neck is supple. No masses. No JVD. Lungs are clear. Cardiac exam shows a regular rate and rhythm. Abdomen is soft. Extremities are without edema. Gait and ROM are intact. No gross neurologic deficits noted. His prior cath site of the right wrist is stable.  LABORATORY DATA: PENDING  Lab Results  Component Value Date   WBC 9.9 03/29/2014   HGB 11.2* 03/29/2014   HCT 33.5* 03/29/2014   PLT 469* 03/29/2014   GLUCOSE 89 03/28/2014   CHOL 154 03/25/2014   TRIG 115 03/25/2014   HDL 38* 03/25/2014   LDLCALC 93 03/25/2014   ALT 30 03/26/2014   AST 28 03/26/2014   NA 140 03/28/2014   K 3.7 03/28/2014   CL 105 03/28/2014   CREATININE 0.92 03/28/2014   BUN 10 03/28/2014   CO2 20 03/28/2014   HGBA1C 6.1* 03/25/2014   PROCEDURE: 1. Left heart cath; 2. Coronary angiography; 3. Left ventriculography; 4. PCI diagonal #1; 5. FFR mid LAD  CONSENT:  The risks, benefits, and details of the procedure were explained in detail to the patient. Risks including death, stroke, heart attack, kidney injury, allergy, limb ischemia, bleeding and radiation injury were discussed. The patient verbalized understanding and wanted to proceed. Informed written consent was obtained.  PROCEDURE TECHNIQUE: After Xylocaine anesthesia a 5 French Slender sheath was placed in the right radial artery with a single anterior  needle wall stick. Coronary angiography was done using a 5 [place] JR 4 and JL 3.5 cm diagnostic catheter. Left ventriculography was done using the JR 4 diagnostic catheter and hand injection.  Diagonal #1 was obviously the cause of the patient's non-ST elevation MI.  Segmental diffuse disease was noted up to 95% The LAD starting at the origin og the diagonal and extending to mid vessel was eccentric and 60-80% narrowed.  After discussing the findings with the patient and his wife we proceeded with the plan for PCI of the diagonal an FFR of the LAD.  We proceeded with PCI on the culprit vessel the first. We administered a bivalirudin bolus and infusion to get an ACT that was greater than 300. Brilinta was administered as a180 mg loading dose. We used a 6 French 3.5 cm XB LAD, a Pro-water wire, and a 15 x 2.0 mm balloon. Multiple overlapping balloon inflations were performed. We positioned and deployed a Promus Premier 2.25 x 20 mm long drug-eluting stent to 12 atmospheres. Postdilatation was performed with a 2.25 x 12 mm [place] balloon to 15 atmospheres. TIMI grade 3 flow was noted post. No complications occurred.  We then turned our attention to the LAD. FFR was performed using a Prime Wire. Without adenosine infusion the FFR was 0.62. I was somewhat surprised by this. We then decided to perform PCI on the LAD. After laying out the LAD it became obvious to me that the stent would likely extend at least to the diagonal and may even need to cover the ostium of the diagonal. This could cause reduced flow and stent occlusion in the diagonal. I made the decision to postpone LAD PCI until the patient has had a 3-6 week healing in the previously stented diagonal. We will have him to come back for LAD stenting within the next 4 weeks.  Post procedure the patient had 5/10 chest discomfort without EKG changes. TIMI grade 3 flow was noted in all territories. IV nitroglycerin was started. The discomfort gradually resolved. It  is felt that the discomfort was likely related to spasm in the diagonal territory.  MEDICATIONS: Fentanyl 100 mcg; Versed 2 mg IV  CONTRAST: Total of 250 cc.  COMPLICATIONS: None  HEMODYNAMICS: Aortic pressure 110/73 mmHg ; LV pressure 123/3 mmHg 12 mmHg  ANGIOGRAPHIC DATA: The left main coronary artery is calcified but widely patent.  The left anterior descending artery is dominant. Gives origin to a moderate sized first diagonal contains 95-99% segmental stenosis in the proximal segment. The LAD itself contains intermediate stenosis starting just distal to the origin of the diagonal and extending into the mid vessel obstructing up to 80%..  The left circumflex artery is large and bifurcates on the lateral wall. The mid vessel contains eccentric 50-60% narrowing.  The right coronary artery is calcified and widely patent. Diffuse luminal irregularity is noted.Marland Kitchen  PCI RESULTS: Successful PCI of the culprit vessel for the patient's non-ST elevation MI with reduction and 99% stenosis to 0% with TIMI grade 3 flow in the first diagonal after implantation of a 20 x 2.25 mm drug-eluting stent.  FFR without adenosine infusion demonstrated a value of 0.64 across the mid LAD suggesting that the stenosis is significant.  We decided against treatment of the LAD at this time because stenting the LAD would impact the ostium of the first diagonal and may reduce flow and therefore called stent thrombosis. The patient will return for PCI of the LAD in the next 3-6 weeks.  LEFT VENTRICULOGRAM: Left ventricular angiogram was done in the 30 RAO projection and revealed no significant wall motion abnormality with EF of 60%  IMPRESSIONS: 1. Non-ST elevation MI due to high-grade obstruction in the moderate-sized first diagonal.  2. Successful DES in the diagonal with reduction  in stenosis from 99% to 0% with TIMI grade 3 flow  3. Hemodynamically significant proximal/mid LAD just beyond the origin of the treated diagonal,  that appears to contain 60-80% obstruction but has a significant pressure drop/FFR of 0.62 without adenosine infusion.  4. Moderate mid circumflex with concentric 50-60% stenosis. RCA is diffusely diseased but widely patent  5. Overall normal LV function  RECOMMENDATION: Aspirin and Brilinta  Continue Bivalirudin , full dose until the current bag runs out  Ambulate later today  Wean and DC nitroglycerin later today. Nitroglycerin was started because of presumed spasm in the diagonal causing chest discomfort post procedure. There were no associated EKG changes.  The patient will be eligible for discharge in the a.m. assuming no significant recurrence of chest pain and will be electively scheduled for LAD PCI and FFR of the circumflex in 3-6 weeks by Dr. Verdis PrimeHenry Smith.  Statin therapy, beta blocker therapy, and long-acting nitrates should be prescribed in addition to the current dual antiplatelet therapy at discharge.  .  Echo Study Conclusions  Left ventricle: The cavity size was normal. Wall thickness was increased in a pattern of mild LVH. Systolic function was normal. The estimated ejection fraction was in the range of 55% to 60%. Wall motion was normal; there were no regional wall motion abnormalities.   Assessment / Plan: 1. Recent NSTEMI with DES to the DX - he is feeling well and has no symptoms.   2. Residual disease in the LAD - needs PCI - will arrange for PCI with Dr. Katrinka BlazingSmith. Scheduled for tomorrow.   3. Residual disease in the RCA - manage medically  4. GI bleed - has colitis - etiology not known - remains on Plavix - needs CBC checked today as well. He notes that the steroids he has received for his sinusitis has resolved his issues with his GI tract. No bleeding noted.   5. HLD - on statin  6. Asthma  7. HTN - BP borderline - would consider ACE if needed - would readdress after his PCI  Check labs today. PCI tomorrow with Dr. Katrinka BlazingSmith.   Patient is agreeable to this plan  and will call if any problems develop in the interim.   Rosalio MacadamiaLori C. Theoren Palka, RN, ANP-C [facility] [address] [place], [place]  [id number] [phone]

## 2014-04-16 NOTE — Interval H&P Note (Signed)
Cath Lab Visit (complete for each Cath Lab visit)  Clinical Evaluation Leading to the Procedure:   ACS: no  Non-ACS:    Anginal Classification: CCS II  Anti-ischemic medical therapy: Maximal Therapy (2 or more classes of medications)  Non-Invasive Test Results: No non-invasive testing performed  Prior CABG: No previous CABG      History and Physical Interval Note:  04/16/2014 3:07 PM Had unstable angina one month ago and received a drug-eluting stent to the culprit first diagonal. LAD had an intermediate stenosis and had an FFR performed which and up being 0.62. Since discharge from the hospital the patient has had rare episodes of angina with exertion. No prolonged or severe episodes of discomfort as he did prior to the culprit intervention Calvin [name]  has presented today for surgery, with the diagnosis of cd  The various methods of treatment have been discussed with the patient and family. After consideration of risks, benefits and other options for treatment, the patient has consented to  Procedure(s): PERCUTANEOUS CORONARY STENT INTERVENTION (PCI-S) (N/A) as a surgical intervention .  The patient's history has been reviewed, patient examined, no change in status, stable for surgery.  I have reviewed the patient's chart and labs.  Questions were answered to the patient's satisfaction.     Lyn RecordsHenry W Smith III

## 2014-04-16 NOTE — CV Procedure (Signed)
     PERCUTANEOUS CORONARY INTERVENTION   Calvin [name] is a 48 y.o. male  INDICATION: 48 year old with a history of recent acute coronary syndrome do to subtotal occlusion of the first diagonal which was successfully stented. Intermediate stenosis in the LAD beyond the diagonal was markedly abnormal with an FFR of 0.62. The patient returns today for a staged procedure to treat the LAD stenosis. In the interim since the diagonal stent, he has had occasional angina with heavy physical activity despite medical therapy.  The patient did develop hematochezia post PCI of the diagonal that has resolved completely on aspirin and Plavix.   PROCEDURE: Direct stent of the mid LAD using DES   CONSENT: The risks, benefits, and details of the procedure were explained to the patient. Risks including death, MI, stroke, bleeding, limb ischemia, emergency CABG, renal failure and allergy were described and accepted by the patient.  Informed written consent was obtained prior to proceeding.  PROCEDURE TECHNIQUE:  After Xylocaine anesthesia a 5 French Slender sheath was placed in the right radial artery using an Angiocath and the Seldinger technique..   Coronary guiding shots were made using a ACLS 3.5 cm guide catheter. Antithrombotic therapy, Angiomax bolus followed by infusion, was begun and determined to be therapeutic by ACT. Antiplatelet therapy, Plavix, has been used continuously since the patient's diagonal PCI to  After guiding shots were obtained, a Pro-water 0.014 wire was advanced into the distal LAD. A Xience Alpine 3.5 x 23 mm long drug-eluting stent was positioned and deployed at 12 atmospheres. 2 balloon inflations were performed. We then removed the balloon catheter and used a 3.75 x 15 mm Harmony Emerge. 2 balloon inflations were performed within the stented area to 12 atmospheres. The post procedure angiographic images demonstrated ostial narrowing of the diagonal but brisk flow. The LAD stent does not  jailed diagonal ostium. Final angiographic images were deemed acceptable and the case was terminated. A wrist band was used to achieve hemostasis.  EQUIPMENT: CLS 6 French 3.5 cm guide catheter; 0.014 Pro-water Guidewire; Xience Alpine DES 3.5 x 23 mm; 3.75 x 15 mm Emerge [place] balloon.  CONTRAST:  Total of 115 cc.  COMPLICATIONS:  None.    ANGIOGRAPHIC RESULTS:   The LAD just beyond the origin of the first diagonal contained a hazy segmental 50% narrowing. The stenosis started after the origin of the diagonal. This region had been evaluated by FFR during the diagonal procedure approximately one month ago. The FFR was 0.62. Following stenting, the stenosis was reduced to 0% with TIMI grade 3 flow and a clear step up noted proximal and distal to the stented region. No complications occurred. The origin of the diagonal was left with 50% narrowing which is felt to represent spasm secondary to stent implantation in the LAD.   IMPRESSIONS:  Successful staged LAD intervention with a Xience Alpine DES postdilated to 3.75 mm in diameter with excellent TIMI grade 3 flow.   RECOMMENDATION:  Continue Plavix and aspirin IV nitroglycerin x6 hours and then discontinue. Discharge home in a.m. if no complications.    Lesleigh NoeHenry W Sedric Guia III, MD 04/16/2014 4:04 PM

## 2014-04-17 LAB — BASIC METABOLIC PANEL
BUN: 15 mg/dL (ref 6–23)
CALCIUM: 8.8 mg/dL (ref 8.4–10.5)
CO2: 22 mEq/L (ref 19–32)
Chloride: 110 mEq/L (ref 96–112)
Creatinine, Ser: 0.96 mg/dL (ref 0.50–1.35)
GFR calc non Af Amer: >90 mL/min (ref >90)
Glucose, Bld: 84 mg/dL (ref 70–99)
Potassium: 3.8 mEq/L (ref 3.7–5.3)
SODIUM: 144 meq/L (ref 137–147)

## 2014-04-17 LAB — CBC
HCT: 35.2 % — ABNORMAL LOW (ref 39.0–52.0)
Hemoglobin: 11.4 g/dL — ABNORMAL LOW (ref 13.0–17.0)
MCH: 29.6 pg (ref 26.0–34.0)
MCHC: 32.4 g/dL (ref 30.0–36.0)
MCV: 91.4 fL (ref 78.0–100.0)
PLATELETS: 344 10*3/uL (ref 150–400)
RBC: 3.85 MIL/uL — ABNORMAL LOW (ref 4.22–5.81)
RDW: 14.4 % (ref 11.5–15.5)
WBC: 7.9 10*3/uL (ref 4.0–10.5)

## 2014-04-17 MED FILL — Sodium Chloride IV Soln 0.9%: INTRAVENOUS | Qty: 50 | Status: AC

## 2014-04-17 NOTE — Discharge Instructions (Signed)
PLEASE REMEMBER TO BRING ALL OF YOUR MEDICATIONS TO EACH OF YOUR FOLLOW-UP OFFICE VISITS. ° °PLEASE ATTEND ALL SCHEDULED FOLLOW-UP APPOINTMENTS.  ° °Activity: Increase activity slowly as tolerated. You may shower, but no soaking baths (or swimming) for 1 week. No driving for 2 days. No lifting over 5 lbs for 1 week. No sexual activity for 1 week.  ° °You May Return to Work: in 1 week (if applicable) ° °Wound Care: You may wash cath site gently with soap and water. Keep cath site clean and dry. If you notice pain, swelling, bleeding or pus at your cath site, please call [phone]. ° ° ° °Cardiac Cath Site Care °Refer to this sheet in the next few weeks. These instructions provide you with information on caring for yourself after your procedure. Your caregiver may also give you more specific instructions. Your treatment has been planned according to current medical practices, but problems sometimes occur. Call your caregiver if you have any problems or questions after your procedure. °HOME CARE INSTRUCTIONS °· You may shower 24 hours after the procedure. Remove the bandage (dressing) and gently wash the site with plain soap and water. Gently pat the site dry.  °· Do not apply powder or lotion to the site.  °· Do not sit in a bathtub, swimming pool, or whirlpool for 5 to 7 days.  °· No bending, squatting, or lifting anything over 10 pounds (4.5 kg) as directed by your caregiver.  °· Inspect the site at least twice daily.  °· Do not drive home if you are discharged the same day of the procedure. Have someone else drive you.  °· You may drive 24 hours after the procedure unless otherwise instructed by your caregiver.  °What to expect: °· Any bruising will usually fade within 1 to 2 weeks.  °· Blood that collects in the tissue (hematoma) may be painful to the touch. It should usually decrease in size and tenderness within 1 to 2 weeks.  °SEEK IMMEDIATE MEDICAL CARE IF: °· You have unusual pain at the site or down the  affected limb.  °· You have redness, warmth, swelling, or pain at the site.  °· You have drainage (other than a small amount of blood on the dressing).  °· You have chills.  °· You have a fever or persistent symptoms for more than 72 hours.  °· You have a fever and your symptoms suddenly get worse.  °· Your leg becomes pale, cool, tingly, or numb.  °· You have heavy bleeding from the site. Hold pressure on the site.  °Document Released: 01/15/2011 Document Revised: 12/02/2011 Document Reviewed: 01/15/2011 °Exit[employer]® Patient Information ©2012 [employer]. ° °

## 2014-04-17 NOTE — Progress Notes (Signed)
     SUBJECTIVE: No chest pain or SOB.   BP 132/68  Pulse 75  Temp(Src) 98.1 F (36.7 C) (Oral)  Resp 20  Ht 6\' 1"  (1.854 m)  Wt 205 lb 11 oz (93.3 kg)  BMI 27.14 kg/m2  SpO2 97%  Intake/Output Summary (Last 24 hours) at 04/17/14 0748 Last data filed at 04/16/14 1900  Gross per 24 hour  Intake    295 ml  Output      0 ml  Net    295 ml    PHYSICAL EXAM General: Well developed, well nourished, in no acute distress. Alert and oriented x 3.  Psych:  Good affect, responds appropriately Neck: No JVD. No masses noted.  Lungs: Clear bilaterally with no wheezes or rhonci noted.  Heart: RRR with no murmurs noted. Abdomen: Bowel sounds are present. Soft, non-tender.  Extremities: No lower extremity edema.   LABS: Basic Metabolic Panel:  Recent Labs  40/98/1104/20/15 0917 04/17/14 0500  NA 140 144  K 3.5 3.8  CL 107 110  CO2 26 22  GLUCOSE 81 84  BUN 16 15  CREATININE 1.0 0.96  CALCIUM 9.5 8.8   CBC:  Recent Labs  04/15/14 0917 04/17/14 0500  WBC 10.8* 7.9  HGB 12.4* 11.4*  HCT 37.7* 35.2*  MCV 90.3 91.4  PLT 462.0* 344   Current Meds: . aspirin EC  81 mg Oral Daily  . atorvastatin  40 mg Oral q1800  . clopidogrel  75 mg Oral Q breakfast  . doxycycline  100 mg Oral BID  . fenofibrate  160 mg Oral Daily  . fluticasone  1 spray Each Nare BID  . isosorbide mononitrate  30 mg Oral Daily  . mometasone-formoterol  2 puff Inhalation BID  . multivitamin with minerals  1 tablet Oral Daily  . omega-3 acid ethyl esters  1 g Oral Daily  . predniSONE  10 mg Oral Q breakfast     ASSESSMENT AND PLAN:  1. CAD/Unstable angina: Pt s/p PCI/DES x 1 of mid LAD yesterday. He is on ASA and Plavix. Continue statin. Further discussion regarding beta blocker therapy to be addressed at f/u. His HR is in the low 60s overnight.  Doing well this am. Discharge home today and follow up with Dr. Katrinka BlazingSmith in 2-3 weeks.    Calvin [name]  4/22/20157:48 AM

## 2014-04-17 NOTE — Progress Notes (Signed)
CARDIAC REHAB PHASE I   PRE:  Rate/Rhythm: 68 SR  BP:  Supine:   Sitting: 150/95  Standing:    SaO2: 98%RA  MODE:  Ambulation: 1000 ft   POST:  Rate/Rhythm: 93 SR  BP:  Supine:   Sitting: 166/85  Standing:    SaO2:  0800-0833 Pt walked 1000 ft independently without CP. Tolerated well. Completed ex ed and discussed CRP 2. Pt declined CRP 2 and wants to exercise on his own. Reviewed NTG use.   Luetta Nuttingharlene Taeler Winning, RN BSN  04/17/2014 8:30 AM

## 2014-04-17 NOTE — Discharge Summary (Signed)
CARDIOLOGY DISCHARGE SUMMARY   Patient ID: Calvin [name] Sortor MRN: [id number] DOB/AGE: 49/05/1966 49 y.o.  Admit date: 04/16/2014 Discharge date: 04/17/2014  PCP: Jerl MinaHEDRICK,JAMES, MD Primary Cardiologist:  Dr. Katrinka BlazingSmith  Primary Discharge Diagnosis:    Other and unspecified angina pectoris - 3.5 x 23 mm Xience Alpine DES  to the mid LAD  Procedures: Direct stent of the mid LAD using DES   Hospital Course: Calvin [name] Angulo is a 48 y.o. male with a history of CAD.  He had a recent non-STEMI and a drug-eluting stent to the first diagonal. He had known residual disease in the LAD with PCI planned as an outpatient. During his previous hospitalization, he had colitis so he was seen in the office prior to admission for PCI. He was evaluated in the office on 04/20 and was doing well with no recurrent bleeding. His labs were stable and he came to the hospital for schedule PCI on 04/21.  He had successful percutaneous intervention to the LAD with a 3.5 x 23 mm Xience Alpine DES. He tolerated the procedure well.  On 04/21, he was seen by cardiac rehabilitation and by Dr. Clifton JamesMcAlhany. His cath site was without hematoma or bruit and he was ambulating without chest pain or shortness of breath. He is considered stable for discharge, to follow up in the office.  Labs:   Lab Results  Component Value Date   WBC 7.9 04/17/2014   HGB 11.4* 04/17/2014   HCT 35.2* 04/17/2014   MCV 91.4 04/17/2014   PLT 344 04/17/2014     Recent Labs Lab 04/17/14 0500  NA 144  K 3.8  CL 110  CO2 22  BUN 15  CREATININE 0.96  CALCIUM 8.8  GLUCOSE 84    Recent Labs  04/15/14 0917  INR 1.1*    Cardiac Cath: 04/21 ANGIOGRAPHIC RESULTS: The LAD just beyond the origin of the first diagonal contained a hazy segmental 50% narrowing. The stenosis started after the origin of the diagonal. This region had been evaluated by FFR during the diagonal procedure approximately one month ago. The FFR was 0.62. Following stenting, the  stenosis was reduced to 0% with TIMI grade 3 flow and a clear step up noted proximal and distal to the stented region. No complications occurred. The origin of the diagonal was left with 50% narrowing which is felt to represent spasm secondary to stent implantation in the LAD.  IMPRESSIONS: Successful staged LAD intervention with a Xience Alpine DES postdilated to 3.75 mm in diameter with excellent TIMI grade 3 flow.  RECOMMENDATION: Continue Plavix and aspirin  IV nitroglycerin x6 hours and then discontinue.  Discharge home in a.m. if no complications.  EKG: 04/22 Sinus rhythm Vent. rate 67 BPM PR interval 170 ms QRS duration 100 ms QT/QTc 406/429 ms P-R-T axes 60 77 42  FOLLOW UP PLANS AND APPOINTMENTS Allergies  Allergen Reactions  . Daliresp [Roflumilast]     GI UPSET     Medication List         albuterol 108 (90 BASE) MCG/ACT inhaler  Commonly known as:  PROAIR HFA  Inhale 2 puffs into the lungs every 6 (six) hours as needed for wheezing or shortness of breath.     aspirin 81 MG tablet  Take 1 tablet (81 mg total) by mouth daily.     atorvastatin 40 MG tablet  Commonly known as:  LIPITOR  Take 1 tablet (40 mg total) by mouth daily.     clopidogrel 75 MG tablet  Commonly known as:  PLAVIX  Take 1 tablet (75 mg total) by mouth daily.     COMPOUND W EX  Apply 1 application topically daily as needed (psoriasis). Fluticasone cream 0.05% and Cetaphil Compound Cream     doxycycline 100 MG capsule  Commonly known as:  VIBRAMYCIN  Take 100 mg by mouth 2 (two) times daily.     DULERA 200-5 MCG/ACT Aero  Generic drug:  mometasone-formoterol  Inhale 2 puffs into the lungs 2 (two) times daily.     fenofibrate 160 MG tablet  Take 160 mg by mouth daily.     fluticasone 50 MCG/ACT nasal spray  Commonly known as:  FLONASE  Place 1 spray into both nostrils 2 (two) times daily.     GLUCOS-CHONDROIT-MSM COMPLEX Tabs  Take 1 tablet by mouth daily.     isosorbide  mononitrate 30 MG 24 hr tablet  Commonly known as:  IMDUR  Take 1 tablet (30 mg total) by mouth daily.     multivitamin with minerals Tabs tablet  Take 1 tablet by mouth daily.     nitroGLYCERIN 0.4 MG SL tablet  Commonly known as:  NITROSTAT  Place 1 tablet (0.4 mg total) under the tongue every 5 (five) minutes as needed for chest pain.     omega-3 acid ethyl esters 1 G capsule  Commonly known as:  LOVAZA  Take 1 g by mouth daily.     predniSONE 10 MG tablet  Commonly known as:  DELTASONE  Take 10 mg by mouth daily with breakfast.     VITAMIN C PO  Take 1 tablet by mouth daily.        Discharge Orders   Future Appointments Provider Department Dept Phone   05/09/2014 9:30 AM Rosalio MacadamiaLori C Gerhardt, NP [facility] [phone]   Future Orders Complete By Expires   Diet - low sodium heart healthy  As directed    Increase activity slowly  As directed      Follow-up Information   Follow up with Norma FredricksonLORI GERHARDT, NP On 05/09/2014. (See for Dr. Katrinka BlazingSmith at 09:30 am)    Specialty:  Nurse Practitioner   Contact information:   [address] [place] [id number] [phone]       BRING ALL MEDICATIONS WITH YOU TO FOLLOW UP APPOINTMENTS  Time spent with patient to include physician time: 33 min Signed: Darrol JumpRhonda G Triston Skare, PA-C 04/17/2014, 10:01 AM Co-Sign MD

## 2014-04-17 NOTE — Discharge Summary (Signed)
See full note this am. cdm 

## 2014-04-18 NOTE — Care Management Note (Addendum)
  Page 1 of 1   04/18/2014     4:36:40 PM CARE MANAGEMENT NOTE 04/18/2014  Patient:  Calvin [name],Calvin [name]   Account Number:  [id number]  Date Initiated:  04/18/2014  Documentation initiated by:  Donato SchultzHUTCHINSON,Sylvester Salonga  Subjective/Objective Assessment:   Admitted with angina pectoris     Action/Plan:   CM f/u   Anticipated DC Date:  04/17/2014   Anticipated DC Plan:  HOME/SELF CARE         Choice offered to / List presented to:             Status of service:  Completed, signed off Medicare Important Message given?   (If response is "NO", the following Medicare IM given date fields will be blank) Date Medicare IM given:   Date Additional Medicare IM given:    Discharge Disposition:  HOME/SELF CARE  Per UR Regulation:    If discussed at Long Length of Stay Meetings, dates discussed:    Comments:  No CM needs idenitified this admission d/c 04/17/2014 home / self care. Daivik Overley RN, BSN, [place], [place] 04/18/2014

## 2014-05-09 ENCOUNTER — Encounter: Payer: Self-pay | Admitting: Nurse Practitioner

## 2014-05-09 ENCOUNTER — Ambulatory Visit (INDEPENDENT_AMBULATORY_CARE_PROVIDER_SITE_OTHER): Payer: Managed Care, Other (non HMO) | Admitting: Nurse Practitioner

## 2014-05-09 VITALS — BP 110/70 | HR 77 | Ht 73.0 in | Wt 203.0 lb

## 2014-05-09 DIAGNOSIS — I259 Chronic ischemic heart disease, unspecified: Secondary | ICD-10-CM

## 2014-05-09 DIAGNOSIS — E785 Hyperlipidemia, unspecified: Secondary | ICD-10-CM

## 2014-05-09 LAB — BASIC METABOLIC PANEL
BUN: 14 mg/dL (ref 6–23)
CO2: 25 mEq/L (ref 19–32)
Calcium: 9.7 mg/dL (ref 8.4–10.5)
Chloride: 110 mEq/L (ref 96–112)
Creatinine, Ser: 1.1 mg/dL (ref 0.4–1.5)
GFR: 72.54 mL/min (ref >60.00)
Glucose, Bld: 85 mg/dL (ref 70–99)
Potassium: 3.8 mEq/L (ref 3.5–5.1)
Sodium: 140 mEq/L (ref 135–145)

## 2014-05-09 LAB — LIPID PANEL
Cholesterol: 116 mg/dL (ref 0–200)
HDL: 44 mg/dL (ref >39.00)
LDL Cholesterol: 60 mg/dL (ref 0–99)
Total CHOL/HDL Ratio: 3
Triglycerides: 58 mg/dL (ref 0.0–149.0)
VLDL: 11.6 mg/dL (ref 0.0–40.0)

## 2014-05-09 LAB — CBC
HCT: 37.8 % — ABNORMAL LOW (ref 39.0–52.0)
Hemoglobin: 12.4 g/dL — ABNORMAL LOW (ref 13.0–17.0)
MCHC: 32.8 g/dL (ref 30.0–36.0)
MCV: 91.1 fl (ref 78.0–100.0)
Platelets: 422 10*3/uL — ABNORMAL HIGH (ref 150.0–400.0)
RBC: 4.16 Mil/uL — ABNORMAL LOW (ref 4.22–5.81)
RDW: 14.4 % (ref 11.5–15.5)
WBC: 5.5 10*3/uL (ref 4.0–10.5)

## 2014-05-09 LAB — HEPATIC FUNCTION PANEL
ALT: 26 U/L (ref 0–53)
AST: 25 U/L (ref 0–37)
Albumin: 4.4 g/dL (ref 3.5–5.2)
Alkaline Phosphatase: 36 U/L — ABNORMAL LOW (ref 39–117)
Bilirubin, Direct: 0 mg/dL (ref 0.0–0.3)
Total Bilirubin: 0.6 mg/dL (ref 0.2–1.2)
Total Protein: 6.9 g/dL (ref 6.0–8.3)

## 2014-05-09 NOTE — Progress Notes (Signed)
Calvin [name] Date of Birth: 11/21/1966 Medical Record #[id number]  History of Present Illness: [name] is seen back today for a post hospital visit. Seen for Dr. Katrinka BlazingSmith. He is a 48 year old male with history of HLD, MVP and asthma.   Most recently presented to the ER with chest pain and presyncope. Had NSTEMI and was cathed by Dr. Katrinka BlazingSmith - had Promus DES to the 1st DX - this was a long DES at 2.25 x 20 mm. Has residual disease of hemodynamic significance in the LAD and had FFR of 0.62 and disease in the RCA of 50 to 60% - plan for PCI to the LAD as outpatient - 3 to 6 weeks post MI. Hospitalization was complicated by GI bleed - noted to have colitis on flex sig by Dr. Loreta AveMann - etiology unknown. Also had a sinus infection. No beta blocker due to history of asthma. He is on Plavix. EF was normal by echo. Referred for his PCI at the end of April.  Comes in today. Here with his wife. He is doing very well. Tolerating his medicines. Fasting today. Back to almost all of his usual activities but not driving. Not dizzy or lightheaded. No chest pain reported initially - but then tells me that he will have some discomfort that he notes just as he is finishing his walking - it does not feel anything like his prior chest pain syndrome - lasts for just a minute or so. No NTG use. He is up to 20 minutes of walking a day. Overall he feels like he is doing well. BP is good.    Current Outpatient Prescriptions  Medication Sig Dispense Refill  . albuterol (PROAIR HFA) 108 (90 BASE) MCG/ACT inhaler Inhale 2 puffs into the lungs every 6 (six) hours as needed for wheezing or shortness of breath.  1 Inhaler  prn  . Ascorbic Acid (VITAMIN C PO) Take 1 tablet by mouth daily.      Marland Kitchen. aspirin 81 MG tablet Take 1 tablet (81 mg total) by mouth daily.      Marland Kitchen. atorvastatin (LIPITOR) 40 MG tablet Take 1 tablet (40 mg total) by mouth daily.  30 tablet  11  . clopidogrel (PLAVIX) 75 MG tablet Take 1 tablet (75 mg total) by mouth  daily.  30 tablet  11  . fenofibrate 160 MG tablet Take 160 mg by mouth daily.       . fluticasone (FLONASE) 50 MCG/ACT nasal spray Place 1 spray into both nostrils 2 (two) times daily.      . isosorbide mononitrate (IMDUR) 30 MG 24 hr tablet Take 1 tablet (30 mg total) by mouth daily.  30 tablet  11  . Misc Natural Products (GLUCOS-CHONDROIT-MSM COMPLEX) TABS Take 1 tablet by mouth daily.      . mometasone-formoterol (DULERA) 200-5 MCG/ACT AERO Inhale 2 puffs into the lungs 2 (two) times daily.      . Multiple Vitamin (MULTIVITAMIN WITH MINERALS) TABS tablet Take 1 tablet by mouth daily.      . nitroGLYCERIN (NITROSTAT) 0.4 MG SL tablet Place 1 tablet (0.4 mg total) under the tongue every 5 (five) minutes as needed for chest pain.  25 tablet  3  . omega-3 acid ethyl esters (LOVAZA) 1 G capsule Take 1 g by mouth daily.      . Salicylic Acid (COMPOUND W EX) Apply 1 application topically daily as needed (psoriasis). Fluticasone cream 0.05% and Cetaphil Compound Cream  No current facility-administered medications for this visit.    Allergies  Allergen Reactions  . Daliresp [Roflumilast]     GI UPSET    Past Medical History  Diagnosis Date  . Hyperlipemia   . Psoriasis   . MVP (mitral valve prolapse)     Dr [name]  . Rhinitis   . CAD (coronary artery disease), native coronary artery, DES placed to 1st diag. 03/26/14; residual disease to LAD with FFR to 0.62 and RCA of 50-60%- plan for PCI to LAD 03/27/2014  . ACS (acute coronary syndrome) with NSTEMI 03/25/2014  . GI bleed 03/27/2014  . Dyslipidemia, goal LDL below 70 03/27/2014  . Acute sinus infection 03/27/2014  . Non Q wave myocardial infarction 03/23/2014  . Asthmatic bronchitis   . Asthma     Past Surgical History  Procedure Laterality Date  . No past surgeries    . Flexible sigmoidoscopy Left 03/28/2014    Procedure: FLEXIBLE SIGMOIDOSCOPY;  Surgeon: [name], MD;  [facility];  Service: Endoscopy;  Laterality:  Left;  . Coronary angioplasty with stent placement  03/25/2014; 04/16/2014    "1 + 1"    History  Smoking status  . Never Smoker   Smokeless tobacco  . Never Used    History  Alcohol Use  . Yes    Comment: 4/21//2015 "less than 6 pack beer/4 months"    Family History  Problem Relation Age of Onset  . Emphysema Maternal Grandmother     non smoker  . Asthma Maternal Grandmother   . Prostate cancer Paternal Grandfather   . Heart disease Mother     deceased age 18  . Heart disease Maternal Uncle     valve replaced; deceased age 39    Review of Systems: The review of systems is per the HPI.  All other systems were reviewed and are negative.  Physical Exam: BP 110/70  Pulse 77  Ht 6\' 1"  (1.854 m)  Wt 203 lb (92.08 kg)  BMI 26.79 kg/m2  SpO2 97% Patient is very pleasant and in no acute distress. Skin is warm and dry. Color is normal.  HEENT is unremarkable. Normocephalic/atraumatic. PERRL. Sclera are nonicteric. Neck is supple. No masses. No JVD. Lungs are clear. Cardiac exam shows a regular rate and rhythm. Abdomen is soft. Extremities are without edema. Gait and ROM are intact. No gross neurologic deficits noted.  LABORATORY DATA: Lab Results  Component Value Date   WBC 7.9 04/17/2014   HGB 11.4* 04/17/2014   HCT 35.2* 04/17/2014   PLT 344 04/17/2014   GLUCOSE 84 04/17/2014   CHOL 154 03/25/2014   TRIG 115 03/25/2014   HDL 38* 03/25/2014   LDLCALC 93 03/25/2014   ALT 30 03/26/2014   AST 28 03/26/2014   NA 144 04/17/2014   K 3.8 04/17/2014   CL 110 04/17/2014   CREATININE 0.96 04/17/2014   BUN 15 04/17/2014   CO2 22 04/17/2014   INR 1.1* 04/15/2014   HGBA1C 6.1* 03/25/2014   INDICATION: 48 year old with a history of recent acute coronary syndrome do to subtotal occlusion of the first diagonal which was successfully stented. Intermediate stenosis in the LAD beyond the diagonal was markedly abnormal with an FFR of 0.62. The patient returns today for a staged procedure to treat the  LAD stenosis. In the interim since the diagonal stent, he has had occasional angina with heavy physical activity despite medical therapy.  The patient did develop hematochezia post PCI of the diagonal that has  resolved completely on aspirin and Plavix.  PROCEDURE: Direct stent of the mid LAD using DES  CONSENT:  The risks, benefits, and details of the procedure were explained to the patient. Risks including death, MI, stroke, bleeding, limb ischemia, emergency CABG, renal failure and allergy were described and accepted by the patient. Informed written consent was obtained prior to proceeding.  PROCEDURE TECHNIQUE: After Xylocaine anesthesia a 5 French Slender sheath was placed in the right radial artery using an Angiocath and the Seldinger technique.. Coronary guiding shots were made using a ACLS 3.5 cm guide catheter. Antithrombotic therapy, Angiomax bolus followed by infusion, was begun and determined to be therapeutic by ACT. Antiplatelet therapy, Plavix, has been used continuously since the patient's diagonal PCI to  After guiding shots were obtained, a Pro-water 0.014 wire was advanced into the distal LAD. A Xience Alpine 3.5 x 23 mm long drug-eluting stent was positioned and deployed at 12 atmospheres. 2 balloon inflations were performed. We then removed the balloon catheter and used a 3.75 x 15 mm [place] Emerge. 2 balloon inflations were performed within the stented area to 12 atmospheres. The post procedure angiographic images demonstrated ostial narrowing of the diagonal but brisk flow. The LAD stent does not jailed diagonal ostium. Final angiographic images were deemed acceptable and the case was terminated. A wrist band was used to achieve hemostasis.  EQUIPMENT: CLS 6 French 3.5 cm guide catheter; 0.014 Pro-water Guidewire; Xience Alpine DES 3.5 x 23 mm; 3.75 x 15 mm Emerge [place] balloon.  CONTRAST: Total of 115 cc.  COMPLICATIONS: None.  ANGIOGRAPHIC RESULTS: The LAD just beyond the origin of the  first diagonal contained a hazy segmental 50% narrowing. The stenosis started after the origin of the diagonal. This region had been evaluated by FFR during the diagonal procedure approximately one month ago. The FFR was 0.62. Following stenting, the stenosis was reduced to 0% with TIMI grade 3 flow and a clear step up noted proximal and distal to the stented region. No complications occurred. The origin of the diagonal was left with 50% narrowing which is felt to represent spasm secondary to stent implantation in the LAD.  IMPRESSIONS: Successful staged LAD intervention with a Xience Alpine DES postdilated to 3.75 mm in diameter with excellent TIMI grade 3 flow.  RECOMMENDATION: Continue Plavix and aspirin  IV nitroglycerin x6 hours and then discontinue.  Discharge home in a.m. if no complications.  [name], MD  04/16/2014  4:04 PM  PROCEDURE: 1. Left heart cath; 2. Coronary angiography; 3. Left ventriculography; 4. PCI diagonal #1; 5. FFR mid LAD  CONSENT:  The risks, benefits, and details of the procedure were explained in detail to the patient. Risks including death, stroke, heart attack, kidney injury, allergy, limb ischemia, bleeding and radiation injury were discussed. The patient verbalized understanding and wanted to proceed. Informed written consent was obtained.  PROCEDURE TECHNIQUE: After Xylocaine anesthesia a 5 French Slender sheath was placed in the right radial artery with a single anterior needle wall stick. Coronary angiography was done using a 5 [place] JR 4 and JL 3.5 cm diagnostic catheter. Left ventriculography was done using the JR 4 diagnostic catheter and hand injection.  Diagonal #1 was obviously the cause of the patient's non-ST elevation MI. Segmental diffuse disease was noted up to 95% The LAD starting at the origin og the diagonal and extending to mid vessel was eccentric and 60-80% narrowed.  After discussing the findings with the patient and his wife we proceeded  with the plan for PCI of the diagonal an FFR of the LAD.  We proceeded with PCI on the culprit vessel the first. We administered a bivalirudin bolus and infusion to get an ACT that was greater than 300. Brilinta was administered as a180 mg loading dose. We used a 6 French 3.5 cm XB LAD, a Pro-water wire, and a 15 x 2.0 mm balloon. Multiple overlapping balloon inflations were performed. We positioned and deployed a Promus Premier 2.25 x 20 mm long drug-eluting stent to 12 atmospheres. Postdilatation was performed with a 2.25 x 12 mm [place] balloon to 15 atmospheres. TIMI grade 3 flow was noted post. No complications occurred.  We then turned our attention to the LAD. FFR was performed using a Prime Wire. Without adenosine infusion the FFR was 0.62. I was somewhat surprised by this. We then decided to perform PCI on the LAD. After laying out the LAD it became obvious to me that the stent would likely extend at least to the diagonal and may even need to cover the ostium of the diagonal. This could cause reduced flow and stent occlusion in the diagonal. I made the decision to postpone LAD PCI until the patient has had a 3-6 week healing in the previously stented diagonal. We will have him to come back for LAD stenting within the next 4 weeks.  Post procedure the patient had 5/10 chest discomfort without EKG changes. TIMI grade 3 flow was noted in all territories. IV nitroglycerin was started. The discomfort gradually resolved. It is felt that the discomfort was likely related to spasm in the diagonal territory.  MEDICATIONS: Fentanyl 100 mcg; Versed 2 mg IV  CONTRAST: Total of 250 cc.  COMPLICATIONS: None  HEMODYNAMICS: Aortic pressure 110/73 mmHg ; LV pressure 123/3 mmHg 12 mmHg  ANGIOGRAPHIC DATA: The left main coronary artery is calcified but widely patent.  The left anterior descending artery is dominant. Gives origin to a moderate sized first diagonal contains 95-99% segmental stenosis in the proximal segment.  The LAD itself contains intermediate stenosis starting just distal to the origin of the diagonal and extending into the mid vessel obstructing up to 80%..  The left circumflex artery is large and bifurcates on the lateral wall. The mid vessel contains eccentric 50-60% narrowing.  The right coronary artery is calcified and widely patent. Diffuse luminal irregularity is noted.Marland Kitchen.  PCI RESULTS: Successful PCI of the culprit vessel for the patient's non-ST elevation MI with reduction and 99% stenosis to 0% with TIMI grade 3 flow in the first diagonal after implantation of a 20 x 2.25 mm drug-eluting stent.  FFR without adenosine infusion demonstrated a value of 0.64 across the mid LAD suggesting that the stenosis is significant.  We decided against treatment of the LAD at this time because stenting the LAD would impact the ostium of the first diagonal and may reduce flow and therefore called stent thrombosis. The patient will return for PCI of the LAD in the next 3-6 weeks.  LEFT VENTRICULOGRAM: Left ventricular angiogram was done in the 30 RAO projection and revealed no significant wall motion abnormality with EF of 60%   IMPRESSIONS: 1. Non-ST elevation MI due to high-grade obstruction in the moderate-sized first diagonal.  2. Successful DES in the diagonal with reduction in stenosis from 99% to 0% with TIMI grade 3 flow  3. Hemodynamically significant proximal/mid LAD just beyond the origin of the treated diagonal, that appears to contain 60-80% obstruction but has a significant pressure drop/FFR of 0.62  without adenosine infusion.  4. Moderate mid circumflex with concentric 50-60% stenosis. RCA is diffusely diseased but widely patent  5. Overall normal LV function  RECOMMENDATION: Aspirin and Brilinta  Continue Bivalirudin , full dose until the current bag runs out  Ambulate later today  Wean and DC nitroglycerin later today. Nitroglycerin was started because of presumed spasm in the diagonal causing  chest discomfort post procedure. There were no associated EKG changes.  The patient will be eligible for discharge in the a.m. assuming no significant recurrence of chest pain and will be electively scheduled for LAD PCI and FFR of the circumflex in 3-6 weeks by Dr. Verdis Prime.  Statin therapy, beta blocker therapy, and long-acting nitrates should be prescribed in addition to the current dual antiplatelet therapy at discharge.  .  Echo Study Conclusions  Left ventricle: The cavity size was normal. Wall thickness was increased in a pattern of mild LVH. Systolic function was normal. The estimated ejection fraction was in the range of 55% to 60%. Wall motion was normal; there were no regional wall motion abnormalities.  Assessment / Plan:   1. Recent NSTEMI with DES to the DX .   2. Residual disease in the LAD - now s/p DES to the LAD -  Doing well clinically. He does not wish to go to cardiac rehab.   3. Residual disease in the RCA - manage medically and with CV risk factor modification. Continue with his current regimen. He is to let us know if he has a change in symptoms.   4. GI bleed - has colitis - etiology not known - remains on Plavix - No bleeding noted.   5. HLD - on statin - needs fasting labs today.  6. Asthma   7. HTN - BP great today. No change in current therapy.  See back in 3 months. Check fasting labs today.   Patient is agreeable to this plan and will call if any problems develop in the interim.   Rosalio Macadamia, RN, ANP-C  [facility]  [address]  [place], [place] [address]  [phone]

## 2014-05-09 NOTE — Patient Instructions (Addendum)
Stay on your current medicines  Continue with your walking program - goal is to walk 45 to 60 minutes a day  Ok to drive  We will check labs today  See Dr.Smith in 3 months  Call the [facility] office at [phone] if you have any questions, problems or concerns.

## 2014-08-06 ENCOUNTER — Ambulatory Visit (INDEPENDENT_AMBULATORY_CARE_PROVIDER_SITE_OTHER): Payer: Managed Care, Other (non HMO) | Admitting: Interventional Cardiology

## 2014-08-06 ENCOUNTER — Encounter: Payer: Self-pay | Admitting: Interventional Cardiology

## 2014-08-06 VITALS — BP 125/79 | HR 56 | Ht 73.0 in | Wt 195.0 lb

## 2014-08-06 DIAGNOSIS — I2510 Atherosclerotic heart disease of native coronary artery without angina pectoris: Secondary | ICD-10-CM

## 2014-08-06 DIAGNOSIS — I214 Non-ST elevation (NSTEMI) myocardial infarction: Secondary | ICD-10-CM

## 2014-08-06 DIAGNOSIS — E785 Hyperlipidemia, unspecified: Secondary | ICD-10-CM

## 2014-08-06 NOTE — Progress Notes (Signed)
Patient ID: Calvin [name], male   DOB: 12-10-1966, 48 y.o.   MRN: [id number]    [address] [place], [place]  [address] Phone: [phone] Fax:  [phone]  Date:  08/06/2014   ID:  Calvin [name], DOB 1966/06/08, MRN [id number]  PCP:  [name], MD   ASSESSMENT:  1. CAD stable now status post LAD/diagonal DES in March/April 2015 2. Hyperlipidemia on therapy 3. History of GI bleeding without any recurrence of blood either in the form of bright red blood or melena  PLAN:  1. clinical followup in 6-9 months 2. Call angina dyspnea 3. Continued active lifestyle   SUBJECTIVE: Calvin [name] is a 48 y.o. male who is doing well. He denies angina and dyspnea. No medication side effects. He received both diagonal and LAD drug-eluting stents in the spring. His head no recurrence of symptoms.    Wt Readings from Last 3 Encounters:  08/06/14 195 lb (88.451 kg)  05/09/14 203 lb (92.08 kg)  04/17/14 205 lb 11 oz (93.3 kg)     Past Medical History  Diagnosis Date  . Hyperlipemia   . Psoriasis   . MVP (mitral valve prolapse)     Dr [name]  . Rhinitis   . CAD (coronary artery disease), native coronary artery, DES placed to 1st diag. 03/26/14; residual disease to LAD with FFR to 0.62 and RCA of 50-60%- plan for PCI to LAD 03/27/2014  . ACS (acute coronary syndrome) with NSTEMI 03/25/2014  . GI bleed 03/27/2014  . Dyslipidemia, goal LDL below 70 03/27/2014  . Acute sinus infection 03/27/2014  . Non Q wave myocardial infarction 03/23/2014  . Asthmatic bronchitis   . Asthma     Current Outpatient Prescriptions  Medication Sig Dispense Refill  . Ascorbic Acid (VITAMIN C PO) Take 1 tablet by mouth daily.      Marland Kitchen aspirin 81 MG tablet Take 1 tablet (81 mg total) by mouth daily.      Marland Kitchen atorvastatin (LIPITOR) 40 MG tablet Take 1 tablet (40 mg total) by mouth daily.  30 tablet  11  . clopidogrel (PLAVIX) 75 MG tablet Take 1 tablet (75 mg total) by mouth daily.  30 tablet  11  . fenofibrate  160 MG tablet Take 160 mg by mouth daily.       . fluticasone (FLONASE) 50 MCG/ACT nasal spray Place 1 spray into both nostrils 2 (two) times daily.      . isosorbide mononitrate (IMDUR) 30 MG 24 hr tablet Take 1 tablet (30 mg total) by mouth daily.  30 tablet  11  . Misc Natural Products (GLUCOS-CHONDROIT-MSM COMPLEX) TABS Take 1 tablet by mouth daily.      . mometasone-formoterol (DULERA) 200-5 MCG/ACT AERO Inhale 2 puffs into the lungs 2 (two) times daily.      . Multiple Vitamin (MULTIVITAMIN WITH MINERALS) TABS tablet Take 1 tablet by mouth daily.      . nitroGLYCERIN (NITROSTAT) 0.4 MG SL tablet Place 1 tablet (0.4 mg total) under the tongue every 5 (five) minutes as needed for chest pain.  25 tablet  3  . omega-3 acid ethyl esters (LOVAZA) 1 G capsule Take 1 g by mouth daily.      . Salicylic Acid (COMPOUND W EX) Apply 1 application topically daily as needed (psoriasis). Fluticasone cream 0.05% and Cetaphil Compound Cream      . albuterol (PROAIR HFA) 108 (90 BASE) MCG/ACT inhaler Inhale 2 puffs into the lungs every 6 (six) hours as  needed for wheezing or shortness of breath.  1 Inhaler  prn   No current facility-administered medications for this visit.    Allergies:    Allergies  Allergen Reactions  . Daliresp [Roflumilast]     GI UPSET    Social History:  The patient  reports that he has never smoked. He has never used smokeless tobacco. He reports that he drinks alcohol. He reports that he does not use illicit drugs.   ROS:  Please see the history of present illness.   Walking up to 8 miles per day without difficulty. Denies angina and dyspnea   All other systems reviewed and negative.   OBJECTIVE: VS:  BP 125/79  Pulse 56  Ht 6\' 1"  (1.854 m)  Wt 195 lb (88.451 kg)  BMI 25.73 kg/m2 Well nourished, well developed, in no acute distress, younger appearing HEENT: normal Neck: JVD flat. Carotid bruit absent  Cardiac:  normal S1, S2; RRR; no murmur Lungs:  clear to auscultation  bilaterally, no wheezing, rhonchi or rales Abd: soft, nontender, no hepatomegaly Ext: Edema absent. Pulses 2+ Skin: warm and dry Neuro:  CNs 2-12 intact, no focal abnormalities noted  EKG:  Not performed       Signed, Darci NeedleHenry W. B. Smith III, MD 08/06/2014 10:16 AM

## 2014-08-06 NOTE — Patient Instructions (Signed)
Your physician recommends that you continue on your current medications as directed. Please refer to the Current Medication list given to you today.  Your physician wants you to follow-up in: 7-8 months with Dr.Smith You will receive a reminder letter in the mail two months in advance. If you don't receive a letter, please call our office to schedule the follow-up appointment.

## 2014-12-05 ENCOUNTER — Encounter (HOSPITAL_COMMUNITY): Payer: Self-pay | Admitting: Interventional Cardiology

## 2015-01-09 ENCOUNTER — Encounter (HOSPITAL_COMMUNITY): Payer: Self-pay | Admitting: Cardiovascular Disease

## 2015-03-24 ENCOUNTER — Other Ambulatory Visit: Payer: Self-pay

## 2015-03-24 ENCOUNTER — Other Ambulatory Visit: Payer: Self-pay | Admitting: *Deleted

## 2015-03-24 MED ORDER — ATORVASTATIN CALCIUM 40 MG PO TABS: 40.0000 mg | ORAL_TABLET | Freq: Every day | ORAL | Status: DC

## 2015-03-24 MED ORDER — CLOPIDOGREL BISULFATE 75 MG PO TABS: 75.0000 mg | ORAL_TABLET | Freq: Every day | ORAL | Status: DC

## 2015-03-24 MED ORDER — ISOSORBIDE MONONITRATE ER 30 MG PO TB24: 30.0000 mg | ORAL_TABLET | Freq: Every day | ORAL | Status: DC

## 2015-03-24 NOTE — Telephone Encounter (Signed)
Error

## 2015-04-28 ENCOUNTER — Ambulatory Visit: Payer: Managed Care, Other (non HMO) | Admitting: Interventional Cardiology

## 2015-06-30 DIAGNOSIS — E785 Hyperlipidemia, unspecified: Secondary | ICD-10-CM | POA: Insufficient documentation

## 2015-06-30 NOTE — Progress Notes (Signed)
Cardiology Office Note   Date:  07/01/2015   ID:  Calvin [name], DOB 10/03/1966, MRN [id number]  PCP:  [name], MD  Cardiologist:  [name], MD   Chief Complaint  Patient presents with  . Coronary Artery Disease      History of Present Illness: Calvin [name] is a 49 y.o. male who presents for coronary artery disease with LAD and diagonal DES, and hyperlipidemia..  Patient is doing well. He denies chest discomfort. His been extremely active without limitations. He has had some difficulty with memory. He wonders if the medication regimen has something to do with it.    Past Medical History  Diagnosis Date  . Hyperlipemia   . Psoriasis   . MVP (mitral valve prolapse)     Dr [name]  . Rhinitis   . CAD (coronary artery disease), native coronary artery, DES placed to 1st diag. 03/26/14; residual disease to LAD with FFR to 0.62 and RCA of 50-60%- plan for PCI to LAD 03/27/2014  . ACS (acute coronary syndrome) with NSTEMI 03/25/2014  . GI bleed 03/27/2014  . Dyslipidemia, goal LDL below 70 03/27/2014  . Acute sinus infection 03/27/2014  . Non Q wave myocardial infarction 03/23/2014  . Asthmatic bronchitis   . Asthma     Past Surgical History  Procedure Laterality Date  . No past surgeries    . Flexible sigmoidoscopy Left 03/28/2014    Procedure: FLEXIBLE SIGMOIDOSCOPY;  Surgeon: [name], MD;  Location: [facility] ENDOSCOPY;  Service: Endoscopy;  Laterality: Left;  . Coronary angioplasty with stent placement  03/25/2014; 04/16/2014    "1 + 1"  . Left heart catheterization with coronary angiogram N/A 03/26/2014    Procedure: LEFT HEART CATHETERIZATION WITH CORONARY ANGIOGRAM;  Surgeon: [name], MD;  Location: [facility] CATH LAB;  Service: Cardiovascular;  Laterality: N/A;  . Percutaneous coronary stent intervention (pci-s) N/A 04/16/2014    Procedure: PERCUTANEOUS CORONARY STENT INTERVENTION (PCI-S);  Surgeon: [name], MD;  Location: [facility] CATH LAB;  Service:  Cardiovascular;  Laterality: N/A;     Current Outpatient Prescriptions  Medication Sig Dispense Refill  . albuterol (PROAIR HFA) 108 (90 BASE) MCG/ACT inhaler Inhale 2 puffs into the lungs every 6 (six) hours as needed for wheezing or shortness of breath. 1 Inhaler prn  . Ascorbic Acid (VITAMIN C PO) Take 1 tablet by mouth daily.    Marland Kitchen aspirin 81 MG tablet Take 1 tablet (81 mg total) by mouth daily.    Marland Kitchen atorvastatin (LIPITOR) 40 MG tablet Take 1 tablet (40 mg total) by mouth daily. 30 tablet 0  . fenofibrate 160 MG tablet Take 160 mg by mouth daily.     . fluticasone (FLONASE) 50 MCG/ACT nasal spray Place 1 spray into both nostrils 2 (two) times daily.    . Misc Natural Products (GLUCOS-CHONDROIT-MSM COMPLEX) TABS Take 1 tablet by mouth daily.    . mometasone-formoterol (DULERA) 200-5 MCG/ACT AERO Inhale 2 puffs into the lungs 2 (two) times daily.    . Multiple Vitamin (MULTIVITAMIN WITH MINERALS) TABS tablet Take 1 tablet by mouth daily.    . nitroGLYCERIN (NITROSTAT) 0.4 MG SL tablet Place 1 tablet (0.4 mg total) under the tongue every 5 (five) minutes as needed for chest pain. 25 tablet 3  . omega-3 acid ethyl esters (LOVAZA) 1 G capsule Take 1 g by mouth daily.    . Salicylic Acid (COMPOUND W EX) Apply 1 application topically daily as needed (psoriasis). Fluticasone  cream 0.05% and Cetaphil Compound Cream     No current facility-administered medications for this visit.    Allergies:   Daliresp    Social History:  The patient  reports that he has never smoked. He has never used smokeless tobacco. He reports that he drinks alcohol. He reports that he does not use illicit drugs.   Family History:  The patient's family history includes Asthma in his maternal grandmother; Emphysema in his maternal grandmother; Heart disease in his maternal uncle and mother; Prostate cancer in his paternal grandfather.    ROS:  Please see the history of present illness.   Otherwise, review of systems are  positive for decreased memory.   All other systems are reviewed and negative.    PHYSICAL EXAM: VS:  BP 136/78 mmHg  Pulse 63  Ht 6\' 1"  (1.854 m)  Wt 92.08 kg (203 lb)  BMI 26.79 kg/m2  SpO2 98% , BMI Body mass index is 26.79 kg/(m^2). GEN: Well nourished, well developed, in no acute distress HEENT: normal Neck: no JVD, carotid bruits, or masses Cardiac: RRR; no murmurs, rubs, or gallops,no edema  Respiratory:  clear to auscultation bilaterally, normal work of breathing GI: soft, nontender, nondistended, + BS MS: no deformity or atrophy Skin: warm and dry, no rash Neuro:  Strength and sensation are intact Psych: euthymic mood, full affect   EKG:  EKG is ordered today. The ekg ordered today demonstrates sinus bradycardia 54 bpm with prominent voltage compatible with LVH.   Recent Labs: No results found for requested labs within last 365 days.    Lipid Panel    Component Value Date/Time   CHOL 116 05/09/2014 0958   TRIG 58.0 05/09/2014 0958   HDL 44.00 05/09/2014 0958   CHOLHDL 3 05/09/2014 0958   VLDL 11.6 05/09/2014 0958   LDLCALC 60 05/09/2014 0958      Wt Readings from Last 3 Encounters:  07/01/15 92.08 kg (203 lb)  08/06/14 88.451 kg (195 lb)  05/09/14 92.08 kg (203 lb)      Other studies Reviewed: Additional studies/ records that were reviewed today include: . Review of the above records demonstrates:    ASSESSMENT AND PLAN:  1. Coronary artery disease involving native coronary artery of native heart without angina pectoris Asymptomatic  2. Dyslipidemia, goal LDL below 70 May be having statin side effects. We will check a hepatic and liver panel today.  3. Mitral valve abnormality This is not currently a significant clinical concern.    Current medicines are reviewed at length with the patient today.  The patient has concerns regarding medicines.  The following changes have been made:  The liver and lipid panel will be obtained; discontinue  Plavix and isosorbide;  Labs/ tests ordered today include:   Orders Placed This Encounter  Procedures  . Lipid Profile  . Hepatic function panel  . EKG 12-Lead     Disposition:   FU with HS in 1 year  Signed, Lesleigh NoeSMITH III,Ivyrose Hashman W, MD  07/01/2015 6:04 PM    [facility] [address], [place], [place]  [id number] Phone: [phone]; Fax: [phone]

## 2015-07-01 ENCOUNTER — Encounter: Payer: Self-pay | Admitting: Interventional Cardiology

## 2015-07-01 ENCOUNTER — Ambulatory Visit (INDEPENDENT_AMBULATORY_CARE_PROVIDER_SITE_OTHER): Payer: PRIVATE HEALTH INSURANCE | Admitting: Interventional Cardiology

## 2015-07-01 VITALS — BP 136/78 | HR 63 | Ht 73.0 in | Wt 203.0 lb

## 2015-07-01 DIAGNOSIS — I341 Nonrheumatic mitral (valve) prolapse: Secondary | ICD-10-CM

## 2015-07-01 DIAGNOSIS — I2510 Atherosclerotic heart disease of native coronary artery without angina pectoris: Secondary | ICD-10-CM | POA: Insufficient documentation

## 2015-07-01 DIAGNOSIS — E785 Hyperlipidemia, unspecified: Secondary | ICD-10-CM

## 2015-07-01 NOTE — Patient Instructions (Addendum)
Medication Instructions:   Your physician recommends that you continue on your current medications as directed.EXCEPT PLAVIX AND ISOSORBIDE  STOP TAKING PLAVIX AND ISOSORBIDE  Labwork:  LIPIDS AND LIVER   Testing/Procedures:   Follow-Up:  Your physician wants you to follow-up in:  IN ONE YEAR WITH DR Marlou StarksSMITH  You will receive a reminder letter in the mail two months in advance. If you don't receive a letter, please call our office to schedule the follow-up appointment.    Any Other Special Instructions Will Be Listed Below (If Applicable).

## 2015-07-02 ENCOUNTER — Other Ambulatory Visit: Payer: Self-pay | Admitting: *Deleted

## 2015-07-02 DIAGNOSIS — E785 Hyperlipidemia, unspecified: Secondary | ICD-10-CM

## 2015-07-02 LAB — LIPID PANEL
Cholesterol: 150 mg/dL (ref 0–200)
HDL: 52.3 mg/dL (ref >39.00)
LDL Cholesterol: 89 mg/dL (ref 0–99)
NONHDL: 97.7
Total CHOL/HDL Ratio: 3
Triglycerides: 46 mg/dL (ref 0.0–149.0)
VLDL: 9.2 mg/dL (ref 0.0–40.0)

## 2015-07-02 LAB — HEPATIC FUNCTION PANEL
ALT: 22 U/L (ref 0–53)
AST: 22 U/L (ref 0–37)
Albumin: 4.3 g/dL (ref 3.5–5.2)
Alkaline Phosphatase: 42 U/L (ref 39–117)
Bilirubin, Direct: 0.1 mg/dL (ref 0.0–0.3)
TOTAL PROTEIN: 6.7 g/dL (ref 6.0–8.3)
Total Bilirubin: 0.7 mg/dL (ref 0.2–1.2)

## 2015-07-02 MED ORDER — ATORVASTATIN CALCIUM 20 MG PO TABS: 20.0000 mg | ORAL_TABLET | Freq: Every day | ORAL | Status: DC

## 2015-09-24 ENCOUNTER — Other Ambulatory Visit (INDEPENDENT_AMBULATORY_CARE_PROVIDER_SITE_OTHER): Payer: Managed Care, Other (non HMO) | Admitting: *Deleted

## 2015-09-24 DIAGNOSIS — E785 Hyperlipidemia, unspecified: Secondary | ICD-10-CM | POA: Diagnosis not present

## 2015-09-24 LAB — LIPID PANEL
Cholesterol: 119 mg/dL (ref 0–200)
HDL: 52.1 mg/dL (ref >39.00)
LDL CALC: 53 mg/dL (ref 0–99)
NonHDL: 66.94
Total CHOL/HDL Ratio: 2
Triglycerides: 72 mg/dL (ref 0.0–149.0)
VLDL: 14.4 mg/dL (ref 0.0–40.0)

## 2015-09-24 LAB — HEPATIC FUNCTION PANEL
ALBUMIN: 4.3 g/dL (ref 3.5–5.2)
ALT: 17 U/L (ref 0–53)
AST: 18 U/L (ref 0–37)
Alkaline Phosphatase: 44 U/L (ref 39–117)
BILIRUBIN TOTAL: 0.4 mg/dL (ref 0.2–1.2)
Bilirubin, Direct: 0.1 mg/dL (ref 0.0–0.3)
Total Protein: 6.6 g/dL (ref 6.0–8.3)

## 2015-09-25 ENCOUNTER — Other Ambulatory Visit: Payer: Self-pay

## 2015-09-25 DIAGNOSIS — E785 Hyperlipidemia, unspecified: Secondary | ICD-10-CM

## 2016-04-21 ENCOUNTER — Encounter (HOSPITAL_COMMUNITY): Payer: Self-pay

## 2016-04-21 ENCOUNTER — Observation Stay (HOSPITAL_COMMUNITY)
Admission: EM | Admit: 2016-04-21 | Discharge: 2016-04-23 | Disposition: A | Discharge status: Home / Self Care | Payer: BLUE CROSS/BLUE SHIELD | Attending: Internal Medicine | Admitting: Internal Medicine

## 2016-04-21 ENCOUNTER — Emergency Department (HOSPITAL_COMMUNITY): Payer: BLUE CROSS/BLUE SHIELD

## 2016-04-21 ENCOUNTER — Other Ambulatory Visit: Payer: Self-pay | Admitting: Otolaryngology

## 2016-04-21 DIAGNOSIS — Z955 Presence of coronary angioplasty implant and graft: Secondary | ICD-10-CM | POA: Diagnosis not present

## 2016-04-21 DIAGNOSIS — N179 Acute kidney failure, unspecified: Secondary | ICD-10-CM | POA: Insufficient documentation

## 2016-04-21 DIAGNOSIS — R7989 Other specified abnormal findings of blood chemistry: Secondary | ICD-10-CM | POA: Diagnosis present

## 2016-04-21 DIAGNOSIS — Z7982 Long term (current) use of aspirin: Secondary | ICD-10-CM | POA: Insufficient documentation

## 2016-04-21 DIAGNOSIS — J320 Chronic maxillary sinusitis: Secondary | ICD-10-CM | POA: Diagnosis not present

## 2016-04-21 DIAGNOSIS — I2510 Atherosclerotic heart disease of native coronary artery without angina pectoris: Secondary | ICD-10-CM | POA: Diagnosis present

## 2016-04-21 DIAGNOSIS — I252 Old myocardial infarction: Secondary | ICD-10-CM | POA: Insufficient documentation

## 2016-04-21 DIAGNOSIS — R778 Other specified abnormalities of plasma proteins: Secondary | ICD-10-CM | POA: Diagnosis present

## 2016-04-21 DIAGNOSIS — J338 Other polyp of sinus: Secondary | ICD-10-CM | POA: Diagnosis not present

## 2016-04-21 DIAGNOSIS — J342 Deviated nasal septum: Secondary | ICD-10-CM | POA: Diagnosis not present

## 2016-04-21 DIAGNOSIS — R55 Syncope and collapse: Secondary | ICD-10-CM | POA: Diagnosis not present

## 2016-04-21 DIAGNOSIS — I10 Essential (primary) hypertension: Secondary | ICD-10-CM | POA: Insufficient documentation

## 2016-04-21 DIAGNOSIS — E785 Hyperlipidemia, unspecified: Secondary | ICD-10-CM | POA: Diagnosis not present

## 2016-04-21 DIAGNOSIS — N289 Disorder of kidney and ureter, unspecified: Secondary | ICD-10-CM

## 2016-04-21 DIAGNOSIS — J324 Chronic pansinusitis: Secondary | ICD-10-CM | POA: Diagnosis not present

## 2016-04-21 DIAGNOSIS — R404 Transient alteration of awareness: Secondary | ICD-10-CM | POA: Diagnosis not present

## 2016-04-21 LAB — CBC WITH DIFFERENTIAL/PLATELET
Basophils Absolute: 0 10*3/uL (ref 0.0–0.1)
Basophils Relative: 0 %
Eosinophils Absolute: 0 10*3/uL (ref 0.0–0.7)
Eosinophils Relative: 0 %
HCT: 39 % (ref 39.0–52.0)
HEMOGLOBIN: 12.8 g/dL — AB (ref 13.0–17.0)
LYMPHS ABS: 0.7 10*3/uL (ref 0.7–4.0)
Lymphocytes Relative: 5 %
MCH: 29.2 pg (ref 26.0–34.0)
MCHC: 32.8 g/dL (ref 30.0–36.0)
MCV: 89 fL (ref 78.0–100.0)
Monocytes Absolute: 0.6 10*3/uL (ref 0.1–1.0)
Monocytes Relative: 5 %
NEUTROS PCT: 90 %
Neutro Abs: 12.6 10*3/uL — ABNORMAL HIGH (ref 1.7–7.7)
Platelets: 380 10*3/uL (ref 150–400)
RBC: 4.38 MIL/uL (ref 4.22–5.81)
RDW: 13.1 % (ref 11.5–15.5)
WBC: 14 10*3/uL — ABNORMAL HIGH (ref 4.0–10.5)

## 2016-04-21 LAB — I-STAT TROPONIN, ED: Troponin i, poc: 0.3 ng/mL (ref 0.00–0.08)

## 2016-04-21 LAB — BASIC METABOLIC PANEL
Anion gap: 11 (ref 5–15)
BUN: 16 mg/dL (ref 6–20)
CHLORIDE: 109 mmol/L (ref 101–111)
CO2: 21 mmol/L — ABNORMAL LOW (ref 22–32)
CREATININE: 1.37 mg/dL — AB (ref 0.61–1.24)
Calcium: 8.9 mg/dL (ref 8.9–10.3)
GFR calc Af Amer: >60 mL/min (ref >60)
GFR calc non Af Amer: 58 mL/min — ABNORMAL LOW (ref >60)
Glucose, Bld: 160 mg/dL — ABNORMAL HIGH (ref 65–99)
Potassium: 4.6 mmol/L (ref 3.5–5.1)
Sodium: 141 mmol/L (ref 135–145)

## 2016-04-21 LAB — TROPONIN I: Troponin I: 0.36 ng/mL — ABNORMAL HIGH (ref <0.031)

## 2016-04-21 MED ORDER — AMOXICILLIN-POT CLAVULANATE 875-125 MG PO TABS
1.0000 | ORAL_TABLET | Freq: Once | ORAL | Status: AC
Start: 2016-04-21 — End: 2016-04-21
  Administered 2016-04-21: 1 via ORAL
  Filled 2016-04-21: qty 1

## 2016-04-21 NOTE — ED Provider Notes (Signed)
CSN: [id number]     Arrival date & time 04/21/16  2152 History   First MD Initiated Contact with Patient 04/21/16 2204     Chief Complaint  Patient presents with  . Loss of Consciousness      HPI  Expand All Collapse All   Pt from home with [facility] for syncopal episodes x2. Pt has septum surgery today. Pt did lose conscious but did not hit his head. Pt denies N,V. Pt took 5 norcos at home.        Past Medical History  Diagnosis Date  . Hyperlipemia   . Psoriasis   . MVP (mitral valve prolapse)     Dr [name]  . Rhinitis   . CAD (coronary artery disease), native coronary artery, DES placed to 1st diag. 03/26/14; residual disease to LAD with FFR to 0.62 and RCA of 50-60%- plan for PCI to LAD 03/27/2014  . ACS (acute coronary syndrome) with NSTEMI 03/25/2014  . GI bleed 03/27/2014  . Dyslipidemia, goal LDL below 70 03/27/2014  . Acute sinus infection 03/27/2014  . Non Q wave myocardial infarction (HCC) 03/23/2014  . Asthmatic bronchitis   . Asthma    Past Surgical History  Procedure Laterality Date  . No past surgeries    . Flexible sigmoidoscopy Left 03/28/2014    Procedure: FLEXIBLE SIGMOIDOSCOPY;  Surgeon: [name], MD;  Location: [facility] ENDOSCOPY;  Service: Endoscopy;  Laterality: Left;  . Coronary angioplasty with stent placement  03/25/2014; 04/16/2014    "1 + 1"  . Left heart catheterization with coronary angiogram N/A 03/26/2014    Procedure: LEFT HEART CATHETERIZATION WITH CORONARY ANGIOGRAM;  Surgeon: [name], MD;  Location: [facility] CATH LAB;  Service: Cardiovascular;  Laterality: N/A;  . Percutaneous coronary stent intervention (pci-s) N/A 04/16/2014    Procedure: PERCUTANEOUS CORONARY STENT INTERVENTION (PCI-S);  Surgeon: [name], MD;  Location: [facility] CATH LAB;  Service: Cardiovascular;  Laterality: N/A;   Family History  Problem Relation Age of Onset  . Emphysema Maternal Grandmother     non smoker  . Asthma Maternal Grandmother   . Prostate cancer Paternal  Grandfather   . Heart disease Mother     deceased age 93  . Heart disease Maternal Uncle     valve replaced; deceased age 81   Social History  Substance Use Topics  . Smoking status: Never Smoker   . Smokeless tobacco: Never Used  . Alcohol Use: No    Review of Systems  Constitutional: Negative for fever.  Cardiovascular: Positive for chest pain.  All other systems reviewed and are negative.     Allergies  Daliresp  Home Medications   Prior to Admission medications   Medication Sig Start Date End Date Taking? Authorizing Provider  albuterol (PROAIR HFA) 108 (90 BASE) MCG/ACT inhaler Inhale 2 puffs into the lungs every 6 (six) hours as needed for wheezing or shortness of breath. 02/21/12  Yes Waymon Budge, MD  atorvastatin (LIPITOR) 20 MG tablet Take 1 tablet (20 mg total) by mouth daily. 07/02/15  Yes [name], MD  fenofibrate 160 MG tablet Take 160 mg by mouth daily.  12/09/11  Yes Historical Provider, MD  mometasone-formoterol (DULERA) 200-5 MCG/ACT AERO Inhale 1 puff into the lungs 2 (two) times daily.    Yes Historical Provider, MD  nitroGLYCERIN (NITROSTAT) 0.4 MG SL tablet Place 1 tablet (0.4 mg total) under the tongue every 5 (five) minutes as needed for chest pain. 03/28/14  Yes [name], PA-C  Salicylic Acid (COMPOUND W EX) Apply 1 application topically daily as needed (psoriasis). Fluticasone cream 0.05% and Cetaphil Compound Cream   Yes Historical Provider, MD  amoxicillin-clavulanate (AUGMENTIN) 875-125 MG tablet Take 1 tablet by mouth 2 (two) times daily. 04/23/16   Ripudeep Jenna Luo, MD  aspirin 81 MG tablet Take 1 tablet (81 mg total) by mouth daily. 03/28/14   [name], PA-C  metoprolol tartrate (LOPRESSOR) 25 MG tablet Take 1 tablet (25 mg total) by mouth 2 (two) times daily. 04/23/16   Ripudeep K Rai, MD   BP 158/86 mmHg  Pulse 86  Temp(Src) 98.5 F (36.9 C) (Oral)  Resp 16  Ht 6\' 1"  (1.854 m)  Wt 201 lb 11.2 oz (91.491 kg)  BMI 26.62 kg/m2   SpO2 97% Physical Exam  Constitutional: He is oriented to person, place, and time. He appears well-developed and well-nourished. No distress.  HENT:  Head: Normocephalic and atraumatic.  Patient has no active bleeding from his recent surgery.  He has packing in place.  He has a head pack which is secured by a curcumfarention strap  Eyes: Pupils are equal, round, and reactive to light.  Neck: Normal range of motion.  Cardiovascular: Normal rate and intact distal pulses.   Pulmonary/Chest: No respiratory distress.  Abdominal: Normal appearance. He exhibits no distension.  Musculoskeletal: Normal range of motion.  Neurological: He is alert and oriented to person, place, and time. No cranial nerve deficit.  Skin: Skin is warm and dry. No rash noted.  Psychiatric: He has a normal mood and affect. His behavior is normal.  Nursing note and vitals reviewed.   ED Course  Procedures (including critical care time) Labs Review Results for orders placed or performed during the hospital encounter of 04/21/16  CBC with Differential/Platelet  Result Value Ref Range   WBC 14.0 (H) 4.0 - 10.5 K/uL   RBC 4.38 4.22 - 5.81 MIL/uL   Hemoglobin 12.8 (L) 13.0 - 17.0 g/dL   HCT 16.1 09.6 - 04.5 %   MCV 89.0 78.0 - 100.0 fL   MCH 29.2 26.0 - 34.0 pg   MCHC 32.8 30.0 - 36.0 g/dL   RDW 40.9 81.1 - 91.4 %   Platelets 380 150 - 400 K/uL   Neutrophils Relative % 90 %   Neutro Abs 12.6 (H) 1.7 - 7.7 K/uL   Lymphocytes Relative 5 %   Lymphs Abs 0.7 0.7 - 4.0 K/uL   Monocytes Relative 5 %   Monocytes Absolute 0.6 0.1 - 1.0 K/uL   Eosinophils Relative 0 %   Eosinophils Absolute 0.0 0.0 - 0.7 K/uL   Basophils Relative 0 %   Basophils Absolute 0.0 0.0 - 0.1 K/uL  Basic metabolic panel  Result Value Ref Range   Sodium 141 135 - 145 mmol/L   Potassium 4.6 3.5 - 5.1 mmol/L   Chloride 109 101 - 111 mmol/L   CO2 21 (L) 22 - 32 mmol/L   Glucose, Bld 160 (H) 65 - 99 mg/dL   BUN 16 6 - 20 mg/dL   Creatinine,  Ser 7.82 (H) 0.61 - 1.24 mg/dL   Calcium 8.9 8.9 - 95.6 mg/dL   GFR calc non Af Amer 58 (L) >60 mL/min   GFR calc Af Amer >60 >60 mL/min   Anion gap 11 5 - 15  Troponin I  Result Value Ref Range   Troponin I 0.36 (H) <0.031 ng/mL  Troponin I  Result Value Ref Range  Troponin I 1.80 (HH) <0.031 ng/mL  Troponin I  Result Value Ref Range   Troponin I 2.27 (HH) <0.031 ng/mL  CK total and CKMB (cardiac)not at [facility]  Result Value Ref Range   Total CK 112 49 - 397 U/L   CK, MB 8.0 (H) 0.5 - 5.0 ng/mL   Relative Index 7.1 (H) 0.0 - 2.5  CK total and CKMB (cardiac)not at [facility]  Result Value Ref Range   Total CK 135 49 - 397 U/L   CK, MB 12.5 (H) 0.5 - 5.0 ng/mL   Relative Index 9.3 (H) 0.0 - 2.5  CK total and CKMB (cardiac)not at [facility]  Result Value Ref Range   Total CK 163 49 - 397 U/L   CK, MB 10.2 (H) 0.5 - 5.0 ng/mL   Relative Index 6.3 (H) 0.0 - 2.5  Myoglobin, serum  Result Value Ref Range   Myoglobin 52 28 - 72 ng/mL  Myoglobin, serum  Result Value Ref Range   Myoglobin 45 28 - 72 ng/mL  Myoglobin, serum  Result Value Ref Range   Myoglobin 58 28 - 72 ng/mL  Basic metabolic panel  Result Value Ref Range   Sodium 142 135 - 145 mmol/L   Potassium 3.7 3.5 - 5.1 mmol/L   Chloride 109 101 - 111 mmol/L   CO2 22 22 - 32 mmol/L   Glucose, Bld 133 (H) 65 - 99 mg/dL   BUN 13 6 - 20 mg/dL   Creatinine, Ser 0.98 0.61 - 1.24 mg/dL   Calcium 8.7 (L) 8.9 - 10.3 mg/dL   GFR calc non Af Amer >60 >60 mL/min   GFR calc Af Amer >60 >60 mL/min   Anion gap 11 5 - 15  CBC  Result Value Ref Range   WBC 14.6 (H) 4.0 - 10.5 K/uL   RBC 4.03 (L) 4.22 - 5.81 MIL/uL   Hemoglobin 12.1 (L) 13.0 - 17.0 g/dL   HCT 11.9 (L) 14.7 - 82.9 %   MCV 89.6 78.0 - 100.0 fL   MCH 30.0 26.0 - 34.0 pg   MCHC 33.5 30.0 - 36.0 g/dL   RDW 56.2 13.0 - 86.5 %   Platelets 376 150 - 400 K/uL  Troponin I  Result Value Ref Range   Troponin I 1.49 (HH) <0.031 ng/mL  Troponin I  Result Value Ref Range   Troponin  I 1.30 (HH) <0.031 ng/mL  Troponin I  Result Value Ref Range   Troponin I 2.20 (HH) <0.031 ng/mL  Basic metabolic panel  Result Value Ref Range   Sodium 141 135 - 145 mmol/L   Potassium 3.9 3.5 - 5.1 mmol/L   Chloride 111 101 - 111 mmol/L   CO2 21 (L) 22 - 32 mmol/L   Glucose, Bld 103 (H) 65 - 99 mg/dL   BUN 11 6 - 20 mg/dL   Creatinine, Ser 7.84 0.61 - 1.24 mg/dL   Calcium 8.5 (L) 8.9 - 10.3 mg/dL   GFR calc non Af Amer >60 >60 mL/min   GFR calc Af Amer >60 >60 mL/min   Anion gap 9 5 - 15  CBC  Result Value Ref Range   WBC 11.7 (H) 4.0 - 10.5 K/uL   RBC 3.95 (L) 4.22 - 5.81 MIL/uL   Hemoglobin 11.4 (L) 13.0 - 17.0 g/dL   HCT 69.6 (L) 29.5 - 28.4 %   MCV 89.4 78.0 - 100.0 fL   MCH 28.9 26.0 - 34.0 pg   MCHC 32.3 30.0 - 36.0  g/dL   RDW 16.113.5 09.611.5 - 04.515.5 %   Platelets 322 150 - 400 K/uL  I-stat troponin, ED  Result Value Ref Range   Troponin i, poc 0.30 (HH) 0.00 - 0.08 ng/mL   Comment NOTIFIED PHYSICIAN    Comment 3          Echocardiogram  Result Value Ref Range   Weight 3227.2 oz   Height 73 in   BP 158/86 mmHg   Dg Chest Portable 1 View  04/21/2016  CLINICAL DATA:  Patient with syncope.  Low blood pressure. EXAM: PORTABLE CHEST 1 VIEW COMPARISON:  Chest radiograph 03/25/2014. FINDINGS: Stable cardiac and mediastinal contours. No consolidative pulmonary opacities. No pleural effusion or pneumothorax. Monitoring leads overlie the patient. IMPRESSION: No acute cardiopulmonary process. Electronically Signed   By: Annia Beltrew  Davis M.D.   On: 04/21/2016 23:17         MDM   Final diagnoses:  Syncope, unspecified syncope type  Elevated troponin        Nelva Nayobert Keyarra Rendall, MD 04/30/16 1534

## 2016-04-21 NOTE — ED Notes (Signed)
[facility] for syncopal episodes x2. Pt has septum surgery today. Pt did lose conscious but did not hit his head. Pt denies N,V. Pt took 5 norcos at home.

## 2016-04-22 ENCOUNTER — Encounter (HOSPITAL_COMMUNITY): Payer: Self-pay | Admitting: Family Medicine

## 2016-04-22 DIAGNOSIS — R55 Syncope and collapse: Secondary | ICD-10-CM | POA: Diagnosis not present

## 2016-04-22 DIAGNOSIS — N289 Disorder of kidney and ureter, unspecified: Secondary | ICD-10-CM | POA: Diagnosis not present

## 2016-04-22 DIAGNOSIS — R778 Other specified abnormalities of plasma proteins: Secondary | ICD-10-CM | POA: Diagnosis present

## 2016-04-22 DIAGNOSIS — R7989 Other specified abnormal findings of blood chemistry: Secondary | ICD-10-CM | POA: Diagnosis present

## 2016-04-22 DIAGNOSIS — I2510 Atherosclerotic heart disease of native coronary artery without angina pectoris: Secondary | ICD-10-CM | POA: Diagnosis not present

## 2016-04-22 DIAGNOSIS — J320 Chronic maxillary sinusitis: Secondary | ICD-10-CM

## 2016-04-22 LAB — CBC
HCT: 36.1 % — ABNORMAL LOW (ref 39.0–52.0)
Hemoglobin: 12.1 g/dL — ABNORMAL LOW (ref 13.0–17.0)
MCH: 30 pg (ref 26.0–34.0)
MCHC: 33.5 g/dL (ref 30.0–36.0)
MCV: 89.6 fL (ref 78.0–100.0)
Platelets: 376 10*3/uL (ref 150–400)
RBC: 4.03 MIL/uL — AB (ref 4.22–5.81)
RDW: 13.5 % (ref 11.5–15.5)
WBC: 14.6 10*3/uL — AB (ref 4.0–10.5)

## 2016-04-22 LAB — CK TOTAL AND CKMB (NOT AT ARMC)
CK TOTAL: 112 U/L (ref 49–397)
CK, MB: 8 ng/mL — AB (ref 0.5–5.0)
CK, MB: 12.5 ng/mL — AB (ref 0.5–5.0)
CK, MB: 10.2 ng/mL — ABNORMAL HIGH (ref 0.5–5.0)
RELATIVE INDEX: 7.1 — AB (ref 0.0–2.5)
RELATIVE INDEX: 6.3 — AB (ref 0.0–2.5)
Relative Index: 9.3 — ABNORMAL HIGH (ref 0.0–2.5)
Total CK: 135 U/L (ref 49–397)
Total CK: 163 U/L (ref 49–397)

## 2016-04-22 LAB — BASIC METABOLIC PANEL
Anion gap: 11 (ref 5–15)
BUN: 13 mg/dL (ref 6–20)
CO2: 22 mmol/L (ref 22–32)
CREATININE: 1.13 mg/dL (ref 0.61–1.24)
Calcium: 8.7 mg/dL — ABNORMAL LOW (ref 8.9–10.3)
Chloride: 109 mmol/L (ref 101–111)
Glucose, Bld: 133 mg/dL — ABNORMAL HIGH (ref 65–99)
POTASSIUM: 3.7 mmol/L (ref 3.5–5.1)
SODIUM: 142 mmol/L (ref 135–145)

## 2016-04-22 LAB — TROPONIN I
TROPONIN I: 2.27 ng/mL — AB (ref <0.031)
Troponin I: 1.8 ng/mL (ref <0.031)
Troponin I: 2.2 ng/mL (ref <0.031)
Troponin I: 1.49 ng/mL (ref <0.031)

## 2016-04-22 MED ORDER — METOPROLOL TARTRATE 12.5 MG HALF TABLET
12.5000 mg | ORAL_TABLET | Freq: Two times a day (BID) | ORAL | Status: DC
  Administered 2016-04-22 (×3): 12.5 mg via ORAL
  Filled 2016-04-22 (×3): qty 1

## 2016-04-22 MED ORDER — HYDROCODONE-ACETAMINOPHEN 5-325 MG PO TABS: 1.0000 | ORAL_TABLET | ORAL | Status: DC | PRN

## 2016-04-22 MED ORDER — ATORVASTATIN CALCIUM 20 MG PO TABS
20.0000 mg | ORAL_TABLET | Freq: Every day | ORAL | Status: DC
  Administered 2016-04-22 – 2016-04-23 (×2): 20 mg via ORAL
  Filled 2016-04-22 (×2): qty 1

## 2016-04-22 MED ORDER — ONDANSETRON HCL 4 MG/2ML IJ SOLN: 4.0000 mg | Freq: Four times a day (QID) | INTRAMUSCULAR | Status: DC | PRN

## 2016-04-22 MED ORDER — NITROGLYCERIN 0.4 MG SL SUBL: 0.4000 mg | SUBLINGUAL_TABLET | SUBLINGUAL | Status: DC | PRN

## 2016-04-22 MED ORDER — HEPARIN SODIUM (PORCINE) 5000 UNIT/ML IJ SOLN
5000.0000 [IU] | Freq: Three times a day (TID) | INTRAMUSCULAR | Status: DC
  Filled 2016-04-22: qty 1

## 2016-04-22 MED ORDER — POLYETHYLENE GLYCOL 3350 17 G PO PACK: 17.0000 g | PACK | Freq: Every day | ORAL | Status: DC | PRN

## 2016-04-22 MED ORDER — ONDANSETRON HCL 4 MG PO TABS: 4.0000 mg | ORAL_TABLET | Freq: Four times a day (QID) | ORAL | Status: DC | PRN

## 2016-04-22 MED ORDER — FENOFIBRATE 160 MG PO TABS
160.0000 mg | ORAL_TABLET | Freq: Every day | ORAL | Status: DC
  Administered 2016-04-22 – 2016-04-23 (×2): 160 mg via ORAL
  Filled 2016-04-22 (×2): qty 1

## 2016-04-22 MED ORDER — SODIUM CHLORIDE 0.9% FLUSH
3.0000 mL | Freq: Two times a day (BID) | INTRAVENOUS | Status: DC
  Administered 2016-04-23: 3 mL via INTRAVENOUS

## 2016-04-22 MED ORDER — ACETAMINOPHEN 325 MG PO TABS
650.0000 mg | ORAL_TABLET | Freq: Four times a day (QID) | ORAL | Status: DC | PRN
  Administered 2016-04-22 (×2): 650 mg via ORAL
  Filled 2016-04-22 (×2): qty 2

## 2016-04-22 MED ORDER — SODIUM CHLORIDE 0.9 % IV SOLN
INTRAVENOUS | Status: AC
  Administered 2016-04-22: 03:00:00 via INTRAVENOUS

## 2016-04-22 MED ORDER — ASPIRIN EC 81 MG PO TBEC
81.0000 mg | DELAYED_RELEASE_TABLET | Freq: Every day | ORAL | Status: DC
  Administered 2016-04-22 – 2016-04-23 (×2): 81 mg via ORAL
  Filled 2016-04-22 (×2): qty 1

## 2016-04-22 MED ORDER — ALBUTEROL SULFATE HFA 108 (90 BASE) MCG/ACT IN AERS: 2.0000 | INHALATION_SPRAY | Freq: Four times a day (QID) | RESPIRATORY_TRACT | Status: DC | PRN

## 2016-04-22 MED ORDER — MOMETASONE FURO-FORMOTEROL FUM 200-5 MCG/ACT IN AERO
1.0000 | INHALATION_SPRAY | Freq: Two times a day (BID) | RESPIRATORY_TRACT | Status: DC
  Administered 2016-04-22 (×2): 1 via RESPIRATORY_TRACT
  Filled 2016-04-22: qty 8.8

## 2016-04-22 MED ORDER — ACETAMINOPHEN 650 MG RE SUPP: 650.0000 mg | Freq: Four times a day (QID) | RECTAL | Status: DC | PRN

## 2016-04-22 MED ORDER — BISACODYL 5 MG PO TBEC: 5.0000 mg | DELAYED_RELEASE_TABLET | Freq: Every day | ORAL | Status: DC | PRN

## 2016-04-22 MED ORDER — ALBUTEROL SULFATE (2.5 MG/3ML) 0.083% IN NEBU: 2.5000 mg | INHALATION_SOLUTION | Freq: Four times a day (QID) | RESPIRATORY_TRACT | Status: DC | PRN

## 2016-04-22 MED ORDER — AMOXICILLIN-POT CLAVULANATE 875-125 MG PO TABS
1.0000 | ORAL_TABLET | Freq: Two times a day (BID) | ORAL | Status: DC
  Administered 2016-04-22 – 2016-04-23 (×2): 1 via ORAL
  Filled 2016-04-22 (×2): qty 1

## 2016-04-22 NOTE — Consult Note (Signed)
CARDIOLOGY CONSULT NOTE   Patient ID: Ginger Organony Yepes MRN: [id number] DOB/AGE: 51/05/1966 51 y.o.  Admit date: 04/21/2016  Primary Physician   Jerl MinaHEDRICK, JAMES, MD Primary Cardiologist:  Dr. Katrinka BlazingSmith  Reason for Consultation: syncope, elevated troponin  HPI: Mr. Freda JacksonBedsaul is a 50 year old male with a past medical history of HLD, MVP, CAD(DES to 1st diag, residual disease of LAD), and GI bleed.   He had elective nasal polyp surgery on 04/21/16. He returned home and became lightheaded while using the bathroom, he called for help, and his wife found him slumped on the ground. She was able to ease him to the floor and he quickly regained consciousness.Soon after he had an additional syncopal episode. He came to the ED for evaluation. Of note, he says that he took one 5mg  Norco tablet before his syncopal episode.   EKG showed sinus tachycardia with rate of 100. There was T-wave inversion in leads V5 and V6, not noted on previous EKGs. Denies chest pain, shortness of breath. He was diaphoretic. Initial troponin was mildly elevated at 0.36, it has increased to 1.8 this morning.   His last cath was in March 2015. He received drug-eluting stent to his first diagonal, he returned to the Cath Lab the next month for staged PCI to LAD. At that time the left circumflex had mid vessel narrowing at 50-60%. Last echo was in March 2015, EF was 55-60%. There was mild LVH, no wall motion abnormalities.  He is minimally active, works as a [employer] administrator. He denies chest pain or SOB with activity. Does not smoke or drink alcohol.    Past Medical History  Diagnosis Date  . Hyperlipemia   . Psoriasis   . MVP (mitral valve prolapse)     Dr Lady GaryFath  . Rhinitis   . CAD (coronary artery disease), native coronary artery, DES placed to 1st diag. 03/26/14; residual disease to LAD with FFR to 0.62 and RCA of 50-60%- plan for PCI to LAD 03/27/2014  . ACS (acute coronary syndrome) with NSTEMI 03/25/2014  . GI bleed 03/27/2014    . Dyslipidemia, goal LDL below 70 03/27/2014  . Acute sinus infection 03/27/2014  . Non Q wave myocardial infarction (HCC) 03/23/2014  . Asthmatic bronchitis   . Asthma      Past Surgical History  Procedure Laterality Date  . No past surgeries    . Flexible sigmoidoscopy Left 03/28/2014    Procedure: FLEXIBLE SIGMOIDOSCOPY;  Surgeon: Theda BelfastPatrick D Hung, MD;  [facility];  Service: Endoscopy;  Laterality: Left;  . Coronary angioplasty with stent placement  03/25/2014; 04/16/2014    "1 + 1"  . Left heart catheterization with coronary angiogram N/A 03/26/2014    Procedure: LEFT HEART CATHETERIZATION WITH CORONARY ANGIOGRAM;  Surgeon: Lesleigh NoeHenry W Yaileen Hofferber III, MD;  [facility];  Service: Cardiovascular;  Laterality: N/A;  . Percutaneous coronary stent intervention (pci-s) N/A 04/16/2014    Procedure: PERCUTANEOUS CORONARY STENT INTERVENTION (PCI-S);  Surgeon: Lesleigh NoeHenry W Kashae Carstens III, MD;  [facility];  Service: Cardiovascular;  Laterality: N/A;    Allergies  Allergen Reactions  . Daliresp [Roflumilast]     GI UPSET    I have reviewed the patient's current medications . aspirin EC  81 mg Oral Daily  . atorvastatin  20 mg Oral Daily  . fenofibrate  160 mg Oral Daily  . heparin  5,000 Units Subcutaneous Q8H  . metoprolol tartrate  12.5 mg Oral BID  . mometasone-formoterol  1 puff Inhalation BID  . sodium chloride flush  3 mL Intravenous Q12H   . sodium chloride 100 mL/hr at 04/22/16 0242   acetaminophen **OR** acetaminophen, albuterol, bisacodyl, HYDROcodone-acetaminophen, nitroGLYCERIN, ondansetron **OR** ondansetron (ZOFRAN) IV, polyethylene glycol  Prior to Admission medications   Medication Sig Start Date End Date Taking? Authorizing Provider  albuterol (PROAIR HFA) 108 (90 BASE) MCG/ACT inhaler Inhale 2 puffs into the lungs every 6 (six) hours as needed for wheezing or shortness of breath. 02/21/12  Yes Waymon Budge, MD  atorvastatin (LIPITOR) 20 MG tablet Take 1 tablet  (20 mg total) by mouth daily. 07/02/15  Yes [name], MD  fenofibrate 160 MG tablet Take 160 mg by mouth daily.  12/09/11  Yes Historical Provider, MD  mometasone-formoterol (DULERA) 200-5 MCG/ACT AERO Inhale 1 puff into the lungs 2 (two) times daily.    Yes Historical Provider, MD  nitroGLYCERIN (NITROSTAT) 0.4 MG SL tablet Place 1 tablet (0.4 mg total) under the tongue every 5 (five) minutes as needed for chest pain. 03/28/14  Yes [name], PA-C  Salicylic Acid (COMPOUND W EX) Apply 1 application topically daily as needed (psoriasis). Fluticasone cream 0.05% and Cetaphil Compound Cream   Yes Historical Provider, MD  aspirin 81 MG tablet Take 1 tablet (81 mg total) by mouth daily. 03/28/14   [name], PA-C     Social History   Social History  . Marital Status: Married    Spouse Name: N/A  . Number of Children: 0  . Years of Education: N/A   Occupational History  . computer tech    Social History Main Topics  . Smoking status: Never Smoker   . Smokeless tobacco: Never Used  . Alcohol Use: No  . Drug Use: No  . Sexual Activity: Yes   Other Topics Concern  . Not on file   Social History Narrative    Family Status  Relation Status Death Age  . Mother Deceased 58  . Maternal Uncle Deceased 33   Family History  Problem Relation Age of Onset  . Emphysema Maternal Grandmother     non smoker  . Asthma Maternal Grandmother   . Prostate cancer Paternal Grandfather   . Heart disease Mother     deceased age 40  . Heart disease Maternal Uncle     valve replaced; deceased age 80     ROS:  Full 14 point review of systems complete and found to be negative unless listed above.  Physical Exam: Blood pressure 156/82, pulse 95, temperature 98.6 F (37 C), temperature source Oral, resp. rate 18, height  (1.854 m), weight 204 lb 1.6 oz (92.579 kg), SpO2 95 %.  General: Well developed, well nourished, male in no acute distress Head: Eyes PERRLA, No xanthomas.    Normocephalic and atraumatic, oropharynx without edema or exudate.  Lungs: CTA Heart: HRRR S1 S2, no rub/gallop, 2/6 systolic murmur. Pulses are 2+ extrem.   Neck: No carotid bruits. No lymphadenopathy. No JVD. Abdomen: Bowel sounds present, abdomen soft and non-tender without masses or hernias noted. Msk:  No spine or cva tenderness. No weakness, no joint deformities or effusions. Extremities: No clubbing or cyanosis. No edema.  Neuro: Alert and oriented X 3. No focal deficits noted. Psych:  Good affect, responds appropriately Skin: No rashes or lesions noted.  Labs:   Lab Results  Component Value Date   WBC 14.6* 04/22/2016   HGB 12.1* 04/22/2016   HCT 36.1* 04/22/2016   MCV  89.6 04/22/2016   PLT 376 04/22/2016   Recent Labs Lab 04/22/16 0630  NA 142  K 3.7  CL 109  CO2 22  BUN 13  CREATININE 1.13  CALCIUM 8.7*  GLUCOSE 133*    Recent Labs  04/21/16 2210 04/22/16 0206 04/22/16 0550 04/22/16 0630  CKTOTAL  --  112  --  135  CKMB  --  8.0*  --  12.5*  TROPONINI 0.36*  --  1.80*  --     Recent Labs  04/21/16 2229  TROPIPOC 0.30*   No results found for: BNP Lab Results  Component Value Date   CHOL 119 09/24/2015   HDL 52.10 09/24/2015   LDLCALC 53 09/24/2015   TRIG 72.0 09/24/2015    Echo: Pending   ECG: Sinus tach  Radiology:  Dg Chest Portable 1 View  04/21/2016  CLINICAL DATA:  Patient with syncope.  Low blood pressure. EXAM: PORTABLE CHEST 1 VIEW COMPARISON:  Chest radiograph 03/25/2014. FINDINGS: Stable cardiac and mediastinal contours. No consolidative pulmonary opacities. No pleural effusion or pneumothorax. Monitoring leads overlie the patient. IMPRESSION: No acute cardiopulmonary process. Electronically Signed   By: Annia Belt M.D.   On: 04/21/2016 23:17    ASSESSMENT AND PLAN:    Principal Problem:   Syncope Active Problems:   Chronic maxillary sinusitis   CAD in native artery   Elevated troponin   Kidney disease   Faintness   1.  Elevated troponin: Patient is chest pain free. He had NSTEMI in 2015, and states that his anginal pain then was mostly left arm pain. Denies left arm pain.  He reports high compliance with his meds. On ASA and statin.  Last LDL was 89.  His troponin is currently elevated at 1.8.  EKG shows new T wave inversion.  Given his history of CAD and elevated troponin he will need heart cath. Start on heparin gtt.   2. Syncope: Positive orthostatic vitals in ED, and he had just taken pain medication, which he does not normally take.  Will review Echo when results. No arrhythmia noted on tele.     Signed: Little Ishikawa, NP 04/22/2016 10:33 AM Pager [phone]  Co-Sign MD

## 2016-04-22 NOTE — Progress Notes (Signed)
   04/22/16 0158  Vitals  Temp 98.4 F (36.9 C)  Temp Source Oral  Resp 16  Orthostatic Lying   BP- Lying 162/82 mmHg  Pulse- Lying 108  Oxygen Therapy  SpO2 98 %  O2 Device Room Air  Admitted pt to rm 3E06 from ED, pt alert and oriented, denied pain at this time, oriented to room, call bell placed within reach.

## 2016-04-22 NOTE — Progress Notes (Signed)
Patient seen and examined, admitted by Dr. Antionette Charpyd this morning  Briefly for 50 year old male with CAD with PCI in 2015, mild persistent asthma, hyperlipidemia, mitral valve prolapse, chronic sinusitis, status post nasal polyp resection on 4/26 presented with 2 syncopal episodes at home after the procedure. In ED, patient had sinus tachycardia otherwise stable. EKG showed borderline T-wave abnormalities, rate 100. Troponin slightly elevated 0.3 now second elevated 1.8. Patient was admitted for further workup.  BP 156/82 mmHg  Pulse 95  Temp(Src) 98.6 F (37 C) (Oral)  Resp 18  Ht 6\' 1"  (1.854 m)  Wt 92.579 kg (204 lb 1.6 oz)  BMI 26.93 kg/m2  SpO2 95%    CAD with elevated troponin : Known history of coronary disease with NSTEMI, PCI to LAD and diagonal branch 2015, hypertension, hyperlipidemia  - No anginal complaints; EKG with diffuse T-wave abnormality  - Initial troponin 0.36, 2nd elevated to 1.8 [facility]-Called cardiology consultation, not starting heparin drip due to recent sinus surgery yesterday - Continue statin, aspirin, beta blocker   Syncope : Likely orthostatic versus vasovagal however rule out cardiac due to an elevated troponins  - orthostatic vitals were positive, continue IV fluid hydration  - Follow 2-D echocardiogram, cardiology consulted    Mild acute kidney injury - creatinine 1.3 at the time of admission with positive orthostasis, - Patient was placed on IV fluid hydration, creatinine improved to 1.1, at baseline  S/p Nasal polyp surgery - Currently stable, ENT Dr. Emeline DarlingGore, per patient packing to be removed next week  Asthma - Currently no wheezing  DVT prophylaxis Heparin subcutaneous   Lorean Ekstrand M.D. [facility] 04/22/2016, 9:59 AM  Pager: [phone]

## 2016-04-22 NOTE — Progress Notes (Signed)
CRITICAL VALUE ALERT  Critical value received:  Trop=1.8  Date of notification:  04/22/16  Time of notification:  0655  Critical value read back: yes   Nurse who received alert:  G. Yavier Snider  MD notified (1st page):  Keenan BachelorSofia NP  Time of first page:  [phone]  MD notified (2nd page): Dr. Isidoro Donningai  Time of second page:[phone]  Responding MD:  Awaiting call back/order  Time MD responded:

## 2016-04-22 NOTE — Progress Notes (Signed)
Pt is positive orthostatic vitals, K. Keenan BachelorSofia NP made aware.

## 2016-04-22 NOTE — Progress Notes (Signed)
Paged MD about critical Troponin levels. Will continue to monitor.

## 2016-04-22 NOTE — ED Notes (Signed)
Gave pt ice pack per request for slight headache

## 2016-04-22 NOTE — H&P (Signed)
History and Physical    Calvin [name] ZOX:[id number] DOB: 06-20-1966 DOA: 04/21/2016  Referring Provider: EDP PCP: Calvin Mina, MD  Outpatient Specialists: Dr. [name] (cardiology), Dr. [name] (pulmonology)   Patient coming from: Home   Chief Complaint: Syncope   HPI: Calvin [name] is a 50 y.o. male with medical history significant for CAD with stents placed in 2015, mild persistent asthma, hyperlipidemia, mitral valve prolapse, and chronic sinusitis status post nasal polyp resection earlier today who presents to the ED after 2 syncopal episodes at home. Patient reports being in his usual state when he presented for an elective nasal polyp removal surgery earlier on 04/21/2016. After returning home, he became lightheaded while urinating, called his wife for help, and she arrived as he was slumping to the ground. She was able to ease him to the floor and there was no head strike. He quickly regained consciousness, but was diaphoretic. This episode repeated itself and patient came into the ED for evaluation. He denies any preceding fevers, chills, chest pain, palpitations, or dyspnea. He denies any chest pain or palpitations. He has never had similar episode previously.  ED Course: Upon arrival to the ED, patient is found to be afebrile, saturating well on room air, and with vital signs stable. EKG features a sinus tachycardia with rate 100 and borderline T-wave abnormalities diffusely. Chest x-ray is negative for acute cardiopulmonary disease and initial troponin is elevated to 0.36. BMP is notable for serum creatinine 1.37, up from 1.1 in 2015 last check. CBC features a leukocytosis to 14,000 and hemoglobin 12.8. Cardiology was consulted by the ED P and advised admitting the patient to hospital for serial measurement of troponin and CK-MB. Patient has remained asymptomatic in the emergency department and reports feeling back to his baseline. He will be admitted to the telemetry unit for ongoing evaluation  and management of syncopal episodes with elevated troponin in a patient with known CAD.  Review of Systems:  All other systems reviewed and apart from HPI, are negative.  Past Medical History  Diagnosis Date  . Hyperlipemia   . Psoriasis   . MVP (mitral valve prolapse)     Dr Calvin [name]  . Rhinitis   . CAD (coronary artery disease), native coronary artery, DES placed to 1st diag. 03/26/14; residual disease to LAD with FFR to 0.62 and RCA of 50-60%- plan for PCI to LAD 03/27/2014  . ACS (acute coronary syndrome) with NSTEMI 03/25/2014  . GI bleed 03/27/2014  . Dyslipidemia, goal LDL below 70 03/27/2014  . Acute sinus infection 03/27/2014  . Non Q wave myocardial infarction (HCC) 03/23/2014  . Asthmatic bronchitis   . Asthma     Past Surgical History  Procedure Laterality Date  . No past surgeries    . Flexible sigmoidoscopy Left 03/28/2014    Procedure: FLEXIBLE SIGMOIDOSCOPY;  Surgeon: Calvin Belfast, MD;  Location: [facility] ENDOSCOPY;  Service: Endoscopy;  Laterality: Left;  . Coronary angioplasty with stent placement  03/25/2014; 04/16/2014    "1 + 1"  . Left heart catheterization with coronary angiogram N/A 03/26/2014    Procedure: LEFT HEART CATHETERIZATION WITH CORONARY ANGIOGRAM;  Surgeon: Calvin Noe, MD;  Location: [facility] CATH LAB;  Service: Cardiovascular;  Laterality: N/A;  . Percutaneous coronary stent intervention (pci-s) N/A 04/16/2014    Procedure: PERCUTANEOUS CORONARY STENT INTERVENTION (PCI-S);  Surgeon: Calvin Noe, MD;  Location: [facility] CATH LAB;  Service: Cardiovascular;  Laterality: N/A;     reports that he has never smoked. He  has never used smokeless tobacco. He reports that he drinks alcohol. He reports that he does not use illicit drugs.  Allergies  Allergen Reactions  . Daliresp [Roflumilast]     GI UPSET    Family History  Problem Relation Age of Onset  . Emphysema Maternal Grandmother     non smoker  . Asthma Maternal Grandmother   . Prostate cancer Paternal  Grandfather   . Heart disease Mother     deceased age 25  . Heart disease Maternal Uncle     valve replaced; deceased age 30     Prior to Admission medications   Medication Sig Start Date End Date Taking? Authorizing Provider  albuterol (PROAIR HFA) 108 (90 BASE) MCG/ACT inhaler Inhale 2 puffs into the lungs every 6 (six) hours as needed for wheezing or shortness of breath. 02/21/12  Yes Waymon Budge, MD  atorvastatin (LIPITOR) 20 MG tablet Take 1 tablet (20 mg total) by mouth daily. 07/02/15  Yes [name], MD  fenofibrate 160 MG tablet Take 160 mg by mouth daily.  12/09/11  Yes Historical Provider, MD  mometasone-formoterol (DULERA) 200-5 MCG/ACT AERO Inhale 1 puff into the lungs 2 (two) times daily.    Yes Historical Provider, MD  nitroGLYCERIN (NITROSTAT) 0.4 MG SL tablet Place 1 tablet (0.4 mg total) under the tongue every 5 (five) minutes as needed for chest pain. 03/28/14  Yes Calvin G Barrett, PA-C  Salicylic Acid (COMPOUND W EX) Apply 1 application topically daily as needed (psoriasis). Fluticasone cream 0.05% and Cetaphil Compound Cream   Yes Historical Provider, MD  aspirin 81 MG tablet Take 1 tablet (81 mg total) by mouth daily. 03/28/14   Calvin Jump, PA-C    Physical Exam: Filed Vitals:   04/21/16 2330 04/22/16 0000 04/22/16 0030 04/22/16 0045  BP: 155/89 153/84 145/95 162/92  Pulse: 78 90 113 108  Resp: SpO2: 99% 97% 98% 98%      Constitutional: NAD, calm, comfortable.  Eyes: PERTLA, lids and conjunctivae normal ENMT: Mucous membranes are moist. Posterior pharynx clear of any exudate or lesions. Nose post-surgical with edema, ecchymosis, dressings.   Neck: normal, supple, no masses, no thyromegaly Respiratory: clear to auscultation bilaterally, no wheezing, no crackles. Normal respiratory effort. No accessory muscle use.  Cardiovascular: S1 & S2 heard, regular rate and rhythm, grade III systolic murmur at apex, no rubs / gallops. No extremity edema.   Abdomen: No distension, no tenderness, no masses palpated. Bowel sounds normal.  Musculoskeletal: no clubbing / cyanosis. No joint deformity upper and lower extremities.  Normal muscle tone.  Skin: no rashes, lesions, ulcers. No induration Neurologic: CN 2-12 grossly intact. Sensation intact, DTR normal. Strength 5/5 in all 4 limbs.  Psychiatric: Normal judgment and insight. Alert and oriented x 3. Normal mood.     Labs on Admission: I have personally reviewed following labs and imaging studies  CBC:  Recent Labs Lab 04/21/16 2210  WBC 14.0*  NEUTROABS 12.6*  HGB 12.8*  HCT 39.0  MCV 89.0  PLT 380   Basic Metabolic Panel:  Recent Labs Lab 04/21/16 2210  NA 141  K 4.6  CL 109  CO2 21*  GLUCOSE 160*  BUN 16  CREATININE 1.37*  CALCIUM 8.9   GFR: CrCl cannot be calculated (Unknown ideal weight.). Liver Function Tests: No results for input(s): AST, ALT, ALKPHOS, BILITOT, PROT, ALBUMIN in the last 168 hours. No results for input(s): LIPASE, AMYLASE in the last 168 hours.  No results for input(s): AMMONIA in the last 168 hours. Coagulation Profile: No results for input(s): INR, PROTIME in the last 168 hours. Cardiac Enzymes:  Recent Labs Lab 04/21/16 2210  TROPONINI 0.36*   BNP (last 3 results) No results for input(s): PROBNP in the last 8760 hours. HbA1C: No results for input(s): HGBA1C in the last 72 hours. CBG: No results for input(s): GLUCAP in the last 168 hours. Lipid Profile: No results for input(s): CHOL, HDL, LDLCALC, TRIG, CHOLHDL, LDLDIRECT in the last 72 hours. Thyroid Function Tests: No results for input(s): TSH, T4TOTAL, FREET4, T3FREE, THYROIDAB in the last 72 hours. Anemia Panel: No results for input(s): VITAMINB12, FOLATE, FERRITIN, TIBC, IRON, RETICCTPCT in the last 72 hours. Urine analysis: No results found for: COLORURINE, APPEARANCEUR, LABSPEC, PHURINE, GLUCOSEU, HGBUR, BILIRUBINUR, KETONESUR, PROTEINUR, UROBILINOGEN, NITRITE,  LEUKOCYTESUR Sepsis Labs: @LABRCNTIP (procalcitonin:4,lacticidven:4) )No results found for this or any previous visit (from the past 240 hour(s)).   Radiological Exams on Admission: Dg Chest Portable 1 View  04/21/2016  CLINICAL DATA:  Patient with syncope.  Low blood pressure. EXAM: PORTABLE CHEST 1 VIEW COMPARISON:  Chest radiograph 03/25/2014. FINDINGS: Stable cardiac and mediastinal contours. No consolidative pulmonary opacities. No pleural effusion or pneumothorax. Monitoring leads overlie the patient. IMPRESSION: No acute cardiopulmonary process. Electronically Signed   By: Annia Beltrew  Davis M.D.   On: 04/21/2016 23:17    EKG: Independently reviewed. Sinus tachycardia (rate 100), borderline Tw abnormality diffusely  Assessment/Plan  1. CAD with elevated troponin  - No anginal complaints; EKG with diffuse T-wave abnormality  - Initial troponin 0.36 - Cardiology consulted by EDP and recommends admission for serial troponin and CK-MB measurements  - Pt has known CAD with stents to LAD and diagonal placed in 2015 following a NSTEMI  - Troponin was 4.96 at time of discharge following NSTEMI, and no normal value has ever been documented  - Serial troponin and CK-MB ordered, will follow  - Monitor on telemetry for ischemic changes   2. Syncope  - Likely neurally-mediated reflex syncope given his description  - Monitor on telemetry for arrhythmia  - Check TTE for structural cardiac etiology   3. Kidney disease of unknown chronicity  - SCr is 1.37 on admission, with most recent prior 1.1 in 2015  - BNP is wnl and pt appears euvolemic   - Trend SCr, avoid nephrotoxins      DVT prophylaxis: sq heparin  Code Status: Full  Family Communication: Wife at bedside  Disposition Plan: Admit to telemetry   Consults called: Cardiology  Admission status: Observation     Briscoe Deutscherimothy S Gaberial Cada MD [facility] Pager [phone]  If 7PM-7AM, please contact night-coverage [email or web address] Password  [email or web address]  04/22/2016, 1:03 AM

## 2016-04-23 ENCOUNTER — Observation Stay (HOSPITAL_BASED_OUTPATIENT_CLINIC_OR_DEPARTMENT_OTHER): Payer: BLUE CROSS/BLUE SHIELD

## 2016-04-23 DIAGNOSIS — I2510 Atherosclerotic heart disease of native coronary artery without angina pectoris: Secondary | ICD-10-CM | POA: Diagnosis not present

## 2016-04-23 DIAGNOSIS — R55 Syncope and collapse: Secondary | ICD-10-CM | POA: Diagnosis not present

## 2016-04-23 DIAGNOSIS — J320 Chronic maxillary sinusitis: Secondary | ICD-10-CM | POA: Diagnosis not present

## 2016-04-23 DIAGNOSIS — R7989 Other specified abnormal findings of blood chemistry: Secondary | ICD-10-CM | POA: Diagnosis not present

## 2016-04-23 LAB — CBC
HEMATOCRIT: 35.3 % — AB (ref 39.0–52.0)
HEMOGLOBIN: 11.4 g/dL — AB (ref 13.0–17.0)
MCH: 28.9 pg (ref 26.0–34.0)
MCHC: 32.3 g/dL (ref 30.0–36.0)
MCV: 89.4 fL (ref 78.0–100.0)
Platelets: 322 10*3/uL (ref 150–400)
RBC: 3.95 MIL/uL — ABNORMAL LOW (ref 4.22–5.81)
RDW: 13.5 % (ref 11.5–15.5)
WBC: 11.7 10*3/uL — ABNORMAL HIGH (ref 4.0–10.5)

## 2016-04-23 LAB — BASIC METABOLIC PANEL
ANION GAP: 9 (ref 5–15)
BUN: 11 mg/dL (ref 6–20)
CO2: 21 mmol/L — AB (ref 22–32)
Calcium: 8.5 mg/dL — ABNORMAL LOW (ref 8.9–10.3)
Chloride: 111 mmol/L (ref 101–111)
Creatinine, Ser: 1.04 mg/dL (ref 0.61–1.24)
GFR calc Af Amer: >60 mL/min (ref >60)
GFR calc non Af Amer: >60 mL/min (ref >60)
GLUCOSE: 103 mg/dL — AB (ref 65–99)
POTASSIUM: 3.9 mmol/L (ref 3.5–5.1)
Sodium: 141 mmol/L (ref 135–145)

## 2016-04-23 LAB — MYOGLOBIN, SERUM
MYOGLOBIN: 52 ng/mL (ref 28–72)
MYOGLOBIN: 58 ng/mL (ref 28–72)
Myoglobin: 45 ng/mL (ref 28–72)

## 2016-04-23 LAB — TROPONIN I: Troponin I: 1.3 ng/mL (ref <0.031)

## 2016-04-23 LAB — ECHOCARDIOGRAM COMPLETE
Height: 73 in
WEIGHTICAEL: 3227.2 [oz_av]

## 2016-04-23 MED ORDER — METOPROLOL TARTRATE 25 MG PO TABS
25.0000 mg | ORAL_TABLET | Freq: Two times a day (BID) | ORAL | Status: DC
  Administered 2016-04-23: 25 mg via ORAL
  Filled 2016-04-23: qty 1

## 2016-04-23 MED ORDER — METOPROLOL TARTRATE 25 MG PO TABS: 25.0000 mg | ORAL_TABLET | Freq: Two times a day (BID) | ORAL | Status: DC

## 2016-04-23 MED ORDER — AMOXICILLIN-POT CLAVULANATE 875-125 MG PO TABS: 1.0000 | ORAL_TABLET | Freq: Two times a day (BID) | ORAL | Status: DC

## 2016-04-23 NOTE — Progress Notes (Signed)
  Echocardiogram 2D Echocardiogram has been performed.  Calvin SavoyCasey N Leoda [name] 04/23/2016, 9:36 AM

## 2016-04-23 NOTE — Progress Notes (Signed)
Patient Name: Binnie Vonderhaar Date of Encounter: 04/23/2016  Principal Problem:   Syncope Active Problems:   Chronic maxillary sinusitis   CAD in native artery   Elevated troponin   Kidney disease   Faintness   Primary Cardiologist:Dr. [name]  Patient Profile: Mr. Schools is a 50 year old male with a past medical history of HLD, MVP, CAD(DES to 1st diag, residual disease of LAD), and GI bleed. He had syncopal episode in the bathroom following a nasal polyp removal surgery, and his troponin is elevated.   SUBJECTIVE: Feels well this am, denies chest pain or arm pain.  No SOB.    OBJECTIVE Filed Vitals:   04/22/16 2003 04/22/16 2134 04/23/16 0500 04/23/16 0622  BP:    158/86  Pulse:  93  86  Temp:  98.9 F (37.2 C)  98.5 F (36.9 C)  TempSrc:  Oral  Oral  Resp:    16  Height:      Weight:   202 lb 8 oz (91.853 kg) 201 lb 11.2 oz (91.491 kg)  SpO2: 96%   97%    Intake/Output Summary (Last 24 hours) at 04/23/16 0739 Last data filed at 04/23/16 0622  Gross per 24 hour  Intake    780 ml  Output   1550 ml  Net   -770 ml   Filed Weights   04/22/16 0200 04/23/16 0500 04/23/16 0622  Weight: 204 lb 1.6 oz (92.579 kg) 202 lb 8 oz (91.853 kg) 201 lb 11.2 oz (91.491 kg)    PHYSICAL EXAM General: Well developed, well nourished, male in no acute distress. Head: Normocephalic, atraumatic.  Neck: Supple without bruits, No JVD. Lungs:  Resp regular and unlabored, CTA. Heart: RRR, S1, S2, no S3, S4, or murmur; no rub. Abdomen: Soft, non-tender, non-distended, BS + x 4.  Extremities: No clubbing, cyanosis, No edema.  Neuro: Alert and oriented X 3. Moves all extremities spontaneously. Psych: Normal affect.  LABS: CBC: Recent Labs  04/21/16 2210 04/22/16 0630 04/23/16 0041  WBC 14.0* 14.6* 11.7*  NEUTROABS 12.6*  --   --   HGB 12.8* 12.1* 11.4*  HCT 39.0 36.1* 35.3*  MCV 89.0 89.6 89.4  PLT 380 376 322   Basic Metabolic Panel: Recent Labs  04/22/16 0630  04/23/16 0041  NA 142 141  K 3.7 3.9  CL 109 111  CO2 22 21*  GLUCOSE 133* 103*  BUN 13 11  CREATININE 1.13 1.04  CALCIUM 8.7* 8.5*  Cardiac Enzymes: Recent Labs  04/22/16 0206  04/22/16 0630  04/22/16 1252 04/22/16 1858 04/23/16 0041  CKTOTAL 112  --  135  --  163  --   --   CKMB 8.0*  --  12.5*  --  10.2*  --   --   TROPONINI  --   < >  --   < > 2.20* 1.49* 1.30*  < > = values in this interval not displayed.  Recent Labs  04/21/16 2229  TROPIPOC 0.30*     Current facility-administered medications:  .  acetaminophen (TYLENOL) tablet 650 mg, 650 mg, Oral, Q6H PRN, 650 mg at 04/22/16 1715 **OR** acetaminophen (TYLENOL) suppository 650 mg, 650 mg, Rectal, Q6H PRN, Lavone Neri Opyd, MD .  albuterol (PROVENTIL) (2.5 MG/3ML) 0.083% nebulizer solution 2.5 mg, 2.5 mg, Nebulization, Q6H PRN, Briscoe Deutscher, MD .  amoxicillin-clavulanate (AUGMENTIN) 875-125 MG per tablet 1 tablet, 1 tablet, Oral, BID, Ripudeep K Rai, MD, 1 tablet at 04/22/16 2152 .  aspirin  EC tablet 81 mg, 81 mg, Oral, Daily, Lavone Neriimothy S Opyd, MD, 81 mg at 04/22/16 1024 .  atorvastatin (LIPITOR) tablet 20 mg, 20 mg, Oral, Daily, Lavone Neriimothy S Opyd, MD, 20 mg at 04/22/16 1024 .  bisacodyl (DULCOLAX) EC tablet 5 mg, 5 mg, Oral, Daily PRN, Lavone Neriimothy S Opyd, MD .  fenofibrate tablet 160 mg, 160 mg, Oral, Daily, Lavone Neriimothy S Opyd, MD, 160 mg at 04/22/16 1024 .  heparin injection 5,000 Units, 5,000 Units, Subcutaneous, Q8H, Briscoe Deutscherimothy S Opyd, MD, 5,000 Units at 04/22/16 0229 .  HYDROcodone-acetaminophen (NORCO/VICODIN) 5-325 MG per tablet 1-2 tablet, 1-2 tablet, Oral, Q4H PRN, Lavone Neriimothy S Opyd, MD .  metoprolol tartrate (LOPRESSOR) tablet 12.5 mg, 12.5 mg, Oral, BID, Lavone Neriimothy S Opyd, MD, 12.5 mg at 04/22/16 2152 .  mometasone-formoterol (DULERA) 200-5 MCG/ACT inhaler 1 puff, 1 puff, Inhalation, BID, Briscoe Deutscherimothy S Opyd, MD, 1 puff at 04/22/16 2003 .  nitroGLYCERIN (NITROSTAT) SL tablet 0.4 mg, 0.4 mg, Sublingual, Q5 min PRN, Lavone Neriimothy S Opyd,  MD .  ondansetron (ZOFRAN) tablet 4 mg, 4 mg, Oral, Q6H PRN **OR** ondansetron (ZOFRAN) injection 4 mg, 4 mg, Intravenous, Q6H PRN, Lavone Neriimothy S Opyd, MD .  polyethylene glycol (MIRALAX / GLYCOLAX) packet 17 g, 17 g, Oral, Daily PRN, Lavone Neriimothy S Opyd, MD .  sodium chloride flush (NS) 0.9 % injection 3 mL, 3 mL, Intravenous, Q12H, Lavone Neriimothy S Opyd, MD, 3 mL at 04/22/16 0242    TELE:  NSR      ECG: NSR  Radiology/Studies: Dg Chest Portable 1 View  04/21/2016  CLINICAL DATA:  Patient with syncope.  Low blood pressure. EXAM: PORTABLE CHEST 1 VIEW COMPARISON:  Chest radiograph 03/25/2014. FINDINGS: Stable cardiac and mediastinal contours. No consolidative pulmonary opacities. No pleural effusion or pneumothorax. Monitoring leads overlie the patient. IMPRESSION: No acute cardiopulmonary process. Electronically Signed   By: Annia Beltrew  Davis M.D.   On: 04/21/2016 23:17     Current Medications:  . amoxicillin-clavulanate  1 tablet Oral BID  . aspirin EC  81 mg Oral Daily  . atorvastatin  20 mg Oral Daily  . fenofibrate  160 mg Oral Daily  . heparin  5,000 Units Subcutaneous Q8H  . metoprolol tartrate  12.5 mg Oral BID  . mometasone-formoterol  1 puff Inhalation BID  . sodium chloride flush  3 mL Intravenous Q12H      ASSESSMENT AND PLAN: Principal Problem:   Syncope Active Problems:   Chronic maxillary sinusitis   CAD in native artery   Elevated troponin   Kidney disease   Faintness  1. Elevated troponin: Patient is chest pain free. He had NSTEMI in 2015, and states that his anginal pain then was mostly left arm pain. Denies left arm pain. He reports high compliance with his meds. On ASA and statin. Last LDL was 89.  His troponin is down to 1.3 this morning. Awaiting echo to assess wall motion. No anticoagulation as he is still having bloody drainage from his nasal incisions.  Will review EKG this am.   2. Syncope: Positive orthostatic vitals in ED, and he had just taken pain medication, which  he does not normally take. Will review Echo when results. No arrhythmia noted on tele.   3. HTN: Patient has elevated SBP in 150's since admission.  Will increase metoprolol to 25mg  BID.   Signed, Little IshikawaErin E Smith , NP 7:39 AM 04/23/2016 Pager [phone]

## 2016-04-23 NOTE — Discharge Summary (Signed)
Physician Discharge Summary   Patient ID: Calvin [name] MRN: [id number] DOB/AGE: 51/05/1966 51 y.o.  Admit date: 04/21/2016 Discharge date: 04/23/2016  Primary Care Physician:  Jerl MinaHEDRICK, JAMES, MD  Discharge Diagnoses:    . Syncope   Elevated troponins  . Chronic maxillary sinusitis Status post surgery  . CAD in native artery . hypertension  Consults:  Cardiology  Recommendations for Outpatient Follow-up:  1. please repeat CBC/BMET at next visit   DIET: Heart healthy diet  Allergies:   Allergies  Allergen Reactions  . Daliresp [Roflumilast]     GI UPSET     DISCHARGE MEDICATIONS: Current Discharge Medication List    START taking these medications   Details  amoxicillin-clavulanate (AUGMENTIN) 875-125 MG tablet Take 1 tablet by mouth 2 (two) times daily. Qty: 20 tablet, Refills: 0    metoprolol tartrate (LOPRESSOR) 25 MG tablet Take 1 tablet (25 mg total) by mouth 2 (two) times daily. Qty: 60 tablet, Refills: 3      CONTINUE these medications which have NOT CHANGED   Details  albuterol (PROAIR HFA) 108 (90 BASE) MCG/ACT inhaler Inhale 2 puffs into the lungs every 6 (six) hours as needed for wheezing or shortness of breath. Qty: 1 Inhaler, Refills: prn    atorvastatin (LIPITOR) 20 MG tablet Take 1 tablet (20 mg total) by mouth daily. Qty: 30 tablet, Refills: 11   Associated Diagnoses: Hyperlipidemia    fenofibrate 160 MG tablet Take 160 mg by mouth daily.     mometasone-formoterol (DULERA) 200-5 MCG/ACT AERO Inhale 1 puff into the lungs 2 (two) times daily.     nitroGLYCERIN (NITROSTAT) 0.4 MG SL tablet Place 1 tablet (0.4 mg total) under the tongue every 5 (five) minutes as needed for chest pain. Qty: 25 tablet, Refills: 3    Salicylic Acid (COMPOUND W EX) Apply 1 application topically daily as needed (psoriasis). Fluticasone cream 0.05% and Cetaphil Compound Cream    aspirin 81 MG tablet Take 1 tablet (81 mg total) by mouth daily.         Brief H and  P: For complete details please refer to admission H and P, but in brief Briefly for 50 year old male with CAD with PCI in 2015, mild persistent asthma, hyperlipidemia, mitral valve prolapse, chronic sinusitis, status post nasal polyp resection on 4/26 presented with 2 syncopal episodes at home after the procedure. In ED, patient had sinus tachycardia otherwise stable. EKG showed borderline T-wave abnormalities, rate 100. Troponin slightly elevated 0.3 now second elevated 1.8. Patient was admitted for further workup.  Hospital Course:   CAD with elevated troponin : Known history of coronary disease with NSTEMI, PCI to LAD and diagonal branch 2015, hypertension, hyperlipidemia  - No anginal complaints, no chest pain or shortness of breath; EKG with diffuse T-wave abnormality  - Initial troponin 0.36, 2nd elevated to 1.8 however trended up to 2.27. Cardiology consult was obtained. Patient was not started on heparin drip due to sinus surgery day before the admission. - Patient underwent 2-D echocardiogram which showed EF of 65-70%, no regional wall motion abnormalities however does have moderate to severe MR with moderate holosystolic prolapse involving the posterior leaflet. Discussed in detail with Dr. Mayford Knifeurner , recommended outpatient appointment with Dr. Katrinka BlazingSmith, may need stress test or ischemia workup for further evaluation, to be done outpatient. TEE was recommended as consideration on the 2-D echo however not currently logistically feasible as patient has nasal packing from the recent surgery on 2/26 and is breathing through his mouth. Nasal packing  will come out next week. On reviewing the records, patient appears to have known mitral valve abnormality, recorded by Dr. [name] on 06/2015 in the note. - Continue statin, aspirin, beta blocker   Syncope : Likely orthostatic versus vasovagal  - orthostatic vitals were positive, hence patient received IV  fluid hydration  - Currently back to  baseline.  Mild acute kidney injury - creatinine 1.3 at the time of admission with positive orthostasis, - Patient was placed on IV fluid hydration, creatinine improved to 1.1, at baseline  S/p Nasal polyp surgery - Currently stable, ENT Dr. Emeline Darling, per patient packing to be removed next week  Asthma - Currently no wheezing  Day of Discharge BP 158/86 mmHg  Pulse 86  Temp(Src) 98.5 F (36.9 C) (Oral)  Resp 16  Ht  (1.854 m)  Wt 91.491 kg (201 lb 11.2 oz)  BMI 26.62 kg/m2  SpO2 97%  Physical Exam: General: Alert and awake oriented x3 not in any acute distress. HEENT: anicteric sclera, pupils reactive to light and accommodation, nasal packing CVS: S1-S2 clear no murmur rubs or gallops Chest: clear to auscultation bilaterally, no wheezing rales or rhonchi Abdomen: soft nontender, nondistended, normal bowel sounds Extremities: no cyanosis, clubbing or edema noted bilaterally Neuro: Cranial nerves II-XII intact, no focal neurological deficits   The results of significant diagnostics from this hospitalization (including imaging, microbiology, ancillary and laboratory) are listed below for reference.    LAB RESULTS: Basic Metabolic Panel:  Recent Labs Lab 04/22/16 0630 04/23/16 0041  NA 142 141  K 3.7 3.9  CL 109 111  CO2 22 21*  GLUCOSE 133* 103*  BUN 13 11  CREATININE 1.13 1.04  CALCIUM 8.7* 8.5*   Liver Function Tests: No results for input(s): AST, ALT, ALKPHOS, BILITOT, PROT, ALBUMIN in the last 168 hours. No results for input(s): LIPASE, AMYLASE in the last 168 hours. No results for input(s): AMMONIA in the last 168 hours. CBC:  Recent Labs Lab 04/21/16 2210 04/22/16 0630 04/23/16 0041  WBC 14.0* 14.6* 11.7*  NEUTROABS 12.6*  --   --   HGB 12.8* 12.1* 11.4*  HCT 39.0 36.1* 35.3*  MCV 89.0 89.6 89.4  PLT 380 376 322   Cardiac Enzymes:  Recent Labs Lab 04/22/16 0630  04/22/16 1252 04/22/16 1858 04/23/16 0041  CKTOTAL 135  --  163  --   --    CKMB 12.5*  --  10.2*  --   --   TROPONINI  --   < > 2.20* 1.49* 1.30*  < > = values in this interval not displayed. BNP: Invalid input(s): POCBNP CBG: No results for input(s): GLUCAP in the last 168 hours.  Significant Diagnostic Studies:  Dg Chest Portable 1 View  04/21/2016  CLINICAL DATA:  Patient with syncope.  Low blood pressure. EXAM: PORTABLE CHEST 1 VIEW COMPARISON:  Chest radiograph 03/25/2014. FINDINGS: Stable cardiac and mediastinal contours. No consolidative pulmonary opacities. No pleural effusion or pneumothorax. Monitoring leads overlie the patient. IMPRESSION: No acute cardiopulmonary process. Electronically Signed   By: Annia Belt M.D.   On: 04/21/2016 23:17    2D ECHO: Study Conclusions  - Left ventricle: The cavity size was normal. There was mild  concentric hypertrophy. Systolic function was vigorous. The  estimated ejection fraction was in the range of 65% to 70%. Left  ventricular diastolic function parameters were normal. - Mitral valve: Moderate, holosystolicprolapse, involving the  posterior leaflet. There was moderate to severe regurgitation  directed eccentrically.  Disposition and Follow-up:  Discharge Instructions    Diet - low sodium heart healthy    Complete by:  As directed      Increase activity slowly    Complete by:  As directed             DISPOSITION: home   DISCHARGE FOLLOW-UP Follow-up Information    Follow up with [name], MD. Schedule an appointment as soon as possible for a visit in 2 weeks.   Specialty:  Family Medicine   Why:  for hospital follow-up   Contact information:   [address] [facility] [place] [address] [phone]       Follow up with [name], MD. Schedule an appointment as soon as possible for a visit in 10 days.   Specialty:  Cardiology   Why:  for hospital follow-up   Contact information:   [address] [place] [address] [phone]         Time spent on Discharge:   Signed:   RAI,RIPUDEEP M.D. [facility] 04/23/2016, 2:05 PM Pager: [phone]

## 2016-04-23 NOTE — Progress Notes (Signed)
Discharge instructions reviewed with patient.  Prescriptions given to patient.  He denied any discomforts and stated understanding of all instructions.

## 2016-05-04 ENCOUNTER — Encounter: Payer: Self-pay | Admitting: Physician Assistant

## 2016-05-04 ENCOUNTER — Ambulatory Visit (INDEPENDENT_AMBULATORY_CARE_PROVIDER_SITE_OTHER): Payer: BLUE CROSS/BLUE SHIELD | Admitting: Physician Assistant

## 2016-05-04 VITALS — BP 130/80 | HR 80 | Ht 73.0 in | Wt 206.8 lb

## 2016-05-04 DIAGNOSIS — R778 Other specified abnormalities of plasma proteins: Secondary | ICD-10-CM

## 2016-05-04 DIAGNOSIS — R7989 Other specified abnormal findings of blood chemistry: Secondary | ICD-10-CM

## 2016-05-04 DIAGNOSIS — E785 Hyperlipidemia, unspecified: Secondary | ICD-10-CM

## 2016-05-04 DIAGNOSIS — I340 Nonrheumatic mitral (valve) insufficiency: Secondary | ICD-10-CM | POA: Diagnosis not present

## 2016-05-04 DIAGNOSIS — I2510 Atherosclerotic heart disease of native coronary artery without angina pectoris: Secondary | ICD-10-CM | POA: Diagnosis not present

## 2016-05-04 NOTE — Progress Notes (Signed)
Cardiology Office Note   Date:  05/04/2016   ID:  Calvin [name], DOB Dec 27, 1966, MRN [id number]  PCP:  [place]land Pink, MD  Cardiologist:  Dr Oscar La, PA-C   No chief complaint on file.   History of Present Illness: Calvin [name] is a 50 y.o. male with a history of HLD, MVP, CAD(DES to 1st diag, residual disease of LAD 2015), mild persistent asthma, hyperlipidemia, mitral valve prolapse, chronic sinusitis and GI bleed  DC 04/28 after admission for 2 syncopal episodes after bleeding from sinus surgery 04/26. Orthostatic vital signs were positive. He was seen by cardiology for elevated troponin up to 2.27. Echo results are below, EF was normal and MR was moderate-severe. Dr. Tamala Julian recommended outpatient Myoview.  Calvin [name] presents for post hospital follow-up.  Since discharge from the hospital, he has done very well. He was cleared by the surgeon to begin increasing his activity and is looking forward to starting exercise again. He has not been able to exercise in 2 months because of his sinus problems.  He has not had chest pain or shortness of breath. His is comfort improved significantly once the packing was removed. He has not had any presyncope or syncope. He never gets palpitations. He has not had lower extremity edema. He noted that his heart rate was elevated up to 96 when lying in bed last night, which is unusual for him. He is compliant with his medications including a beta blocker.  He thinks he has been a little bit more thirsty than usual and feels that he drinks an adequate amount of water. His weight is 5 pounds up from his last measurement, but he does not feel that he has any increased volume, shortness of breath or dyspnea on exertion.   Past Medical History  Diagnosis Date  . Hyperlipemia   . Psoriasis   . MVP (mitral valve prolapse)     Dr Ubaldo Glassing  . Rhinitis   . CAD (coronary artery disease), native coronary artery, DES placed to 1st diag. 03/26/14;  residual disease to LAD with FFR to 0.62 and RCA of 50-60%- plan for PCI to LAD 03/27/2014  . ACS (acute coronary syndrome) with NSTEMI 03/25/2014  . GI bleed 03/27/2014  . Dyslipidemia, goal LDL below 70 03/27/2014  . Acute sinus infection 03/27/2014  . Non Q wave myocardial infarction ([place]) 03/23/2014  . Asthmatic bronchitis   . Asthma     Past Surgical History  Procedure Laterality Date  . No past surgeries    . Flexible sigmoidoscopy Left 03/28/2014    Procedure: FLEXIBLE SIGMOIDOSCOPY;  Surgeon: Beryle Beams, MD;  Location: [facility];  Service: Endoscopy;  Laterality: Left;  . Coronary angioplasty with stent placement  03/25/2014; 04/16/2014    "1 + 1"  . Left heart catheterization with coronary angiogram N/A 03/26/2014    Procedure: LEFT HEART CATHETERIZATION WITH CORONARY ANGIOGRAM;  Surgeon: Sinclair Grooms, MD;  Location[facility] CATH LAB;  Service: Cardiovascular;  Laterality: N/A;  . Percutaneous coronary stent intervention (pci-s) N/A 04/16/2014    Procedure: PERCUTANEOUS CORONARY STENT INTERVENTION (PCI-S);  Surgeon: Sinclair Grooms, MD;  Location: [facility] CATH LAB;  Service: Cardiovascular;  Laterality: N/A;    Current Outpatient Prescriptions  Medication Sig Dispense Refill  . albuterol (PROAIR HFA) 108 (90 BASE) MCG/ACT inhaler Inhale 2 puffs into the lungs every 6 (six) hours as needed for wheezing or shortness of breath. 1 Inhaler prn  . aspirin 81 MG tablet  Take 1 tablet (81 mg total) by mouth daily.    Marland Kitchen atorvastatin (LIPITOR) 20 MG tablet Take 1 tablet (20 mg total) by mouth daily. 30 tablet 11  . azelastine (ASTELIN) 0.1 % nasal spray Place 2 sprays into both nostrils 2 (two) times daily.     . fenofibrate 160 MG tablet Take 160 mg by mouth daily.     . Hypertonic Nasal Wash (SINUS RINSE KIT) PACK Place 50 mLs into the nose daily.     . metoprolol tartrate (LOPRESSOR) 25 MG tablet Take 1 tablet (25 mg total) by mouth 2 (two) times daily. 60 tablet 3  . nitroGLYCERIN (NITROSTAT)  0.4 MG SL tablet Place 0.4 mg under the tongue every 5 (five) minutes as needed for chest pain. Up to 3 doses maximum    . Salicylic Acid (COMPOUND W EX) Apply 1 application topically daily as needed (psoriasis). Fluticasone cream 0.05% and Cetaphil Compound Cream     No current facility-administered medications for this visit.    Allergies:   Daliresp    Social History:  The patient  reports that he has never smoked. He has never used smokeless tobacco. He reports that he does not drink alcohol or use illicit drugs.   Family History:  The patient's family history includes Asthma in his maternal grandmother; Emphysema in his maternal grandmother; Heart disease in his maternal uncle and mother; Hypertension in his maternal grandmother; Prostate cancer in his paternal grandfather; Stroke in his maternal uncle. There is no history of Heart attack.    ROS:  Please see the history of present illness. All other systems are reviewed and negative.    PHYSICAL EXAM: VS:  BP 130/80 mmHg  Pulse 80  Ht _0  (1.854 m)  Wt 206 lb 12.8 oz (93.804 kg)  BMI 27.29 kg/m2 , BMI Body mass index is 27.29 kg/(m^2). GEN: Well nourished, well developed, male in no acute distress HEENT: normal for age  Neck: no JVD, no carotid bruit, no masses Cardiac: RRR; 2/6 murmur, loudest at the left lower sternal border, no rubs, or gallops Respiratory:  clear to auscultation bilaterally, normal work of breathing GI: soft, nontender, nondistended, + BS MS: no deformity or atrophy; no edema; distal pulses are 2+ in all 4 extremities  Skin: warm and dry, no rash Neuro:  Strength and sensation are intact Psych: euthymic mood, full affect   EKG:  EKG is ordered today. ECG demonstrates sinus rhythm, heart rate 76, LVH, no significant change from 04/26  Recent Labs: 09/24/2015: ALT 17 04/23/2016: BUN 11; Creatinine, Ser 1.04; Hemoglobin 11.4*; Platelets 322; Potassium 3.9; Sodium 141    Lipid Panel    Component  Value Date/Time   CHOL 119 09/24/2015 0750   TRIG 72.0 09/24/2015 0750   HDL 52.10 09/24/2015 0750   CHOLHDL 2 09/24/2015 0750   VLDL 14.4 09/24/2015 0750   LDLCALC 53 09/24/2015 0750     Wt Readings from Last 3 Encounters:  05/04/16 206 lb 12.8 oz (93.804 kg)  04/23/16 201 lb 11.2 oz (91.491 kg)  07/01/15 203 lb (92.08 kg)     Other studies Reviewed: Additional studies/ records that were reviewed today include: Hospital records, office notes and testing.  ASSESSMENT AND PLAN:  1. Syncope: This was felt secondary to iron depletion, orthostatic vital signs were positive. He does not have a history of syncope and is been doing well since discharge. Monitor for symptoms. Continue current medications.  2. Elevated troponin: This is felt to be  demand ischemia in the setting of syncope and volume depletion. His volume status is much improved. Lexi scan Myoview is indicated to evaluate him for ischemia. He is medically stable for this and we will schedule a treadmill Myoview. Continue current medications for CAD including aspirin, beta blocker and statin  3. Moderate-severe MR: He is currently asymptomatic from this. He is to follow-up with Dr. Tamala Julian after the [facility] and determine if a TEE and possible surgical evaluation is indicated at this time. The patient is aware we will need to follow this murmur over time.   Current medicines are reviewed at length with the patient today.  The patient does not have concerns regarding medicines.  The following changes have been made:  no change  Labs/ tests ordered today include:   Orders Placed This Encounter  Procedures  . Myocardial Perfusion Imaging  . EKG 12-Lead     Disposition:   FU with Dr. Tamala Julian  Signed, Rosaria Ferries, PA-C  05/04/2016 4:46 PM    [place] Phone: [phone]; Fax: [phone]  This note was written with the assistance of speech recognition software. Please excuse any  transcriptional errors.

## 2016-05-04 NOTE — Patient Instructions (Signed)
Medication Instructions:   Your physician recommends that you continue on your current medications as directed. Please refer to the Current Medication list given to you today.   If you need a refill on your cardiac medications before your next appointment, please call your pharmacy.  Labwork: NONE ORDER TODAY   Testing/Procedures: Your physician has requested that you have en exercise stress myoview. For further information please visit [email or web address]. Please follow instruction sheet, as given.   Follow-Up: WITH DR Katrinka BlazingSMITH NEXT AVAILABLE    Any Other Special Instructions Will Be Listed Below (If Applicable).

## 2016-05-12 ENCOUNTER — Telehealth (HOSPITAL_COMMUNITY): Payer: Self-pay | Admitting: *Deleted

## 2016-05-12 NOTE — Telephone Encounter (Signed)
Left message on voicemail in reference to upcoming appointment scheduled for 05/17/16. Phone number given for a call back so details instructions can be given. Alexei Ey J Aksh Swart, RN 

## 2016-05-13 ENCOUNTER — Telehealth (HOSPITAL_COMMUNITY): Payer: Self-pay | Admitting: Radiology

## 2016-05-13 NOTE — Telephone Encounter (Signed)
Patient given detailed instructions per Myocardial Perfusion Study Information Sheet for the test on 05/18/19 at 7:30. Patient notified to arrive 15 minutes early and that it is imperative to arrive on time for appointment to keep from having the test rescheduled.  If you need to cancel or reschedule your appointment, please call the office within 24 hours of your appointment. Failure to do so may result in a cancellation of your appointment, and a $50 no show fee. Patient verbalized understanding.EHK

## 2016-05-17 ENCOUNTER — Ambulatory Visit (HOSPITAL_COMMUNITY): Payer: BLUE CROSS/BLUE SHIELD | Attending: Cardiology

## 2016-05-17 DIAGNOSIS — R9439 Abnormal result of other cardiovascular function study: Secondary | ICD-10-CM | POA: Diagnosis not present

## 2016-05-17 DIAGNOSIS — R7989 Other specified abnormal findings of blood chemistry: Secondary | ICD-10-CM | POA: Diagnosis not present

## 2016-05-17 DIAGNOSIS — I2510 Atherosclerotic heart disease of native coronary artery without angina pectoris: Secondary | ICD-10-CM | POA: Diagnosis not present

## 2016-05-17 DIAGNOSIS — R55 Syncope and collapse: Secondary | ICD-10-CM | POA: Diagnosis not present

## 2016-05-17 DIAGNOSIS — R778 Other specified abnormalities of plasma proteins: Secondary | ICD-10-CM

## 2016-05-17 LAB — MYOCARDIAL PERFUSION IMAGING
CHL CUP NUCLEAR SSS: 6
CHL CUP RESTING HR STRESS: 79 {beats}/min
CHL RATE OF PERCEIVED EXERTION: 19
CSEPED: 7 min
CSEPEDS: 1 s
CSEPPHR: 171 {beats}/min
Estimated workload: 8.5 METS
LV dias vol: 137 mL (ref 62–150)
LVSYSVOL: 53 mL
MPHR: 169 {beats}/min
NUC STRESS TID: 1
Percent HR: 101 %
RATE: 0.28
SDS: 1
SRS: 5

## 2016-05-17 MED ORDER — TECHNETIUM TC 99M TETROFOSMIN IV KIT
10.8000 | PACK | Freq: Once | INTRAVENOUS | Status: AC | PRN
  Administered 2016-05-17: 11 via INTRAVENOUS
  Filled 2016-05-17: qty 11

## 2016-05-17 MED ORDER — TECHNETIUM TC 99M TETROFOSMIN IV KIT
32.8000 | PACK | Freq: Once | INTRAVENOUS | Status: AC | PRN
  Administered 2016-05-17: 32.8 via INTRAVENOUS
  Filled 2016-05-17: qty 33

## 2016-05-31 DIAGNOSIS — R0981 Nasal congestion: Secondary | ICD-10-CM | POA: Diagnosis not present

## 2016-07-28 ENCOUNTER — Other Ambulatory Visit: Payer: Self-pay | Admitting: Interventional Cardiology

## 2016-07-28 DIAGNOSIS — E785 Hyperlipidemia, unspecified: Secondary | ICD-10-CM

## 2016-08-06 ENCOUNTER — Encounter: Payer: Self-pay | Admitting: Interventional Cardiology

## 2016-08-06 ENCOUNTER — Ambulatory Visit (INDEPENDENT_AMBULATORY_CARE_PROVIDER_SITE_OTHER): Payer: BLUE CROSS/BLUE SHIELD | Admitting: Interventional Cardiology

## 2016-08-06 VITALS — BP 130/70 | HR 78 | Ht 73.0 in | Wt 213.8 lb

## 2016-08-06 DIAGNOSIS — H9319 Tinnitus, unspecified ear: Secondary | ICD-10-CM | POA: Diagnosis not present

## 2016-08-06 DIAGNOSIS — I2510 Atherosclerotic heart disease of native coronary artery without angina pectoris: Secondary | ICD-10-CM

## 2016-08-06 DIAGNOSIS — E785 Hyperlipidemia, unspecified: Secondary | ICD-10-CM

## 2016-08-06 DIAGNOSIS — Z8679 Personal history of other diseases of the circulatory system: Secondary | ICD-10-CM | POA: Diagnosis not present

## 2016-08-06 MED ORDER — NITROGLYCERIN 0.4 MG SL SUBL: 0.4000 mg | SUBLINGUAL_TABLET | SUBLINGUAL | 3 refills | Status: DC | PRN

## 2016-08-06 MED ORDER — METOPROLOL TARTRATE 25 MG PO TABS: 12.5000 mg | ORAL_TABLET | Freq: Two times a day (BID) | ORAL | Status: DC

## 2016-08-06 NOTE — Progress Notes (Signed)
Cardiology Office Note    Date:  08/06/2016   ID:  Calvin [name], DOB 25-Apr-1966, MRN [id number]  PCP:  Maryland Pink, MD  Cardiologist: Sinclair Grooms, MD   Chief Complaint  Patient presents with  . Coronary Artery Disease    History of Present Illness:  Calvin [name] is a 50 y.o. male who presents for follow-up of CAD, TS to first diagonal and DES to LAD March 2015. Residual 50-60% RCA stenosis. Dual antiplatelet therapy stopped one year ago. Hyperlipidemia is only other medical problem.  Overall, Calvin [name] feels well. He denies chest discomfort. He has not had any recurrence of symptoms that concern him with reference to his heart. No medication side effects. His primary care physician follows lipids. We discussed target for LDL and blood pressure.    Past Medical History:  Diagnosis Date  . ACS (acute coronary syndrome) with NSTEMI 03/25/2014  . Acute sinus infection 03/27/2014  . Asthma   . Asthmatic bronchitis   . CAD (coronary artery disease), native coronary artery, DES placed to 1st diag. 03/26/14; residual disease to LAD with FFR to 0.62 and RCA of 50-60%- plan for PCI to LAD 03/27/2014  . Dyslipidemia, goal LDL below 70 03/27/2014  . GI bleed 03/27/2014  . Hyperlipemia   . MVP (mitral valve prolapse)    Dr Ubaldo Glassing  . Non Q wave myocardial infarction ([place]) 03/23/2014  . Psoriasis   . Rhinitis     Past Surgical History:  Procedure Laterality Date  . CORONARY ANGIOPLASTY WITH STENT PLACEMENT  03/25/2014; 04/16/2014   "1 + 1"  . FLEXIBLE SIGMOIDOSCOPY Left 03/28/2014   Procedure: FLEXIBLE SIGMOIDOSCOPY;  Surgeon: Beryle Beams, MD;  Location: [facility];  Service: Endoscopy;  Laterality: Left;  . LEFT HEART CATHETERIZATION WITH CORONARY ANGIOGRAM N/A 03/26/2014   Procedure: LEFT HEART CATHETERIZATION WITH CORONARY ANGIOGRAM;  Surgeon: Sinclair Grooms, MD;  Location: [facility] CATH LAB;  Service: Cardiovascular;  Laterality: N/A;  . NO PAST SURGERIES    . PERCUTANEOUS CORONARY STENT  INTERVENTION (PCI-S) N/A 04/16/2014   Procedure: PERCUTANEOUS CORONARY STENT INTERVENTION (PCI-S);  Surgeon: Sinclair Grooms, MD;  Location: [facility] CATH LAB;  Service: Cardiovascular;  Laterality: N/A;    Current Medications: Outpatient Medications Prior to Visit  Medication Sig Dispense Refill  . albuterol (PROAIR HFA) 108 (90 BASE) MCG/ACT inhaler Inhale 2 puffs into the lungs every 6 (six) hours as needed for wheezing or shortness of breath. 1 Inhaler prn  . aspirin 81 MG tablet Take 1 tablet (81 mg total) by mouth daily.    Marland Kitchen atorvastatin (LIPITOR) 20 MG tablet TAKE 1 TABLET BY MOUTH DAILY. 90 tablet 2  . azelastine (ASTELIN) 0.1 % nasal spray Place 2 sprays into both nostrils 2 (two) times daily.     . fenofibrate 160 MG tablet Take 160 mg by mouth daily.     . Hypertonic Nasal Wash (SINUS RINSE KIT) PACK Place 50 mLs into the nose daily.     . metoprolol tartrate (LOPRESSOR) 25 MG tablet Take 1 tablet (25 mg total) by mouth 2 (two) times daily. 60 tablet 3  . nitroGLYCERIN (NITROSTAT) 0.4 MG SL tablet Place 0.4 mg under the tongue every 5 (five) minutes as needed for chest pain. Up to 3 doses maximum    . Salicylic Acid (COMPOUND W EX) Apply 1 application topically daily as needed (psoriasis). Fluticasone cream 0.05% and Cetaphil Compound Cream     No facility-administered medications prior to visit.  Allergies:   Daliresp [roflumilast]   Social History   Social History  . Marital status: Married    Spouse name: N/A  . Number of children: 0  . Years of education: N/A   Occupational History  . computer tech    Social History Main Topics  . Smoking status: Never Smoker  . Smokeless tobacco: Never Used  . Alcohol use No  . Drug use: No  . Sexual activity: Yes   Other Topics Concern  . None   Social History Narrative  . None     Family History:  The patient's family history includes Asthma in his maternal grandmother; Emphysema in his maternal grandmother; Heart  disease in his maternal uncle and mother; Hypertension in his maternal grandmother; Prostate cancer in his paternal grandfather; Stroke in his maternal uncle.   ROS:   Please see the history of present illness.    Tinnitus but otherwise no complaints All other systems reviewed and are negative.   PHYSICAL EXAM:   VS:  BP 130/70   Pulse 78   Ht '6\' 1"'  (1.854 m)   Wt 213 lb 12.8 oz (97 kg)   SpO2 94%   BMI 28.21 kg/m    GEN: Well nourished, well developed, in no acute distress  HEENT: normal  Neck: no JVD, carotid bruits, or masses Cardiac: RRR, rubs, or gallops,no edema . There is a soft apical systolic murmur. Respiratory:  clear to auscultation bilaterally, normal work of breathing GI: soft, nontender, nondistended, + BS MS: no deformity or atrophy  Skin: warm and dry, no rash Neuro:  Alert and Oriented x 3, Strength and sensation are intact Psych: euthymic mood, full affect  Wt Readings from Last 3 Encounters:  08/06/16 213 lb 12.8 oz (97 kg)  05/17/16 206 lb (93.4 kg)  05/04/16 206 lb 12.8 oz (93.8 kg)      Studies/Labs Reviewed:   EKG:  EKG  Not done today  Recent Labs: 09/24/2015: ALT 17 04/23/2016: BUN 11; Creatinine, Ser 1.04; Hemoglobin 11.4; Platelets 322; Potassium 3.9; Sodium 141   Lipid Panel    Component Value Date/Time   CHOL 119 09/24/2015 0750   TRIG 72.0 09/24/2015 0750   HDL 52.10 09/24/2015 0750   CHOLHDL 2 09/24/2015 0750   VLDL 14.4 09/24/2015 0750   LDLCALC 53 09/24/2015 0750    Additional studies/ records that were reviewed today include:  None    ASSESSMENT:    1. CAD in native artery   2. Billowing mitral valve   3. HLD (hyperlipidemia)      PLAN:  In order of problems listed above:  1. Stable without angina. Residual 50% RCA. Both LAD and diagonal stent implantation 2 years ago without recurrent symptoms. Plan exercise treadmill test in one year prior to the office visit. 2. Soft apical systolic murmur. Has mitral valve  prolapse. This needs to be followed over time. 3. Low-fat diet, exercise, and discussed target of LDL 70 or less. 4. Tinnitus could be related to beta blocker therapy. He was never troubled by this prior to metoprolol therapy. We will decrease the dose by 50% for 2 weeks and then discontinue completely. He will call in one month and let us know if the tinnitus has abated    Medication Adjustments/Labs and Tests Ordered: Current medicines are reviewed at length with the patient today.  Concerns regarding medicines are outlined above.  Medication changes, Labs and Tests ordered today are listed in the Patient Instructions below. There are  no Patient Instructions on file for this visit.   Signed, Sinclair Grooms, MD  08/06/2016 9:11 AM    [facility] [address], [place], [place]  [address] Phone: [phone]; Fax: [phone]

## 2016-08-06 NOTE — Patient Instructions (Signed)
Medication Instructions:  Your physician has recommended you make the following change in your medication:  REDUCE Metoprolol to 1/2 tablet twice daily for 2 weeks then STOP  Labwork: None ordered  Testing/Procedures: Your physician has requested that you have an exercise tolerance test. For further information please visit [email or web address] Please also follow instruction sheet, as given. (To be scheduled in July 2018)  Follow-Up: Your physician wants you to follow-up in: 1 year with Dr.Smith You will receive a reminder letter in the mail two months in advance. If you don't receive a letter, please call our office to schedule the follow-up appointment.   Any Other Special Instructions Will Be Listed Below (If Applicable). Call the office in 4 weeks to let us know if the ringing in your ears has resolved.     If you need a refill on your cardiac medications before your next appointment, please call your pharmacy.

## 2017-04-29 ENCOUNTER — Ambulatory Visit (INDEPENDENT_AMBULATORY_CARE_PROVIDER_SITE_OTHER): Payer: BLUE CROSS/BLUE SHIELD | Admitting: Interventional Cardiology

## 2017-04-29 ENCOUNTER — Encounter: Payer: Self-pay | Admitting: Interventional Cardiology

## 2017-04-29 VITALS — BP 124/64 | HR 60 | Ht 73.0 in | Wt 214.0 lb

## 2017-04-29 DIAGNOSIS — I2510 Atherosclerotic heart disease of native coronary artery without angina pectoris: Secondary | ICD-10-CM

## 2017-04-29 DIAGNOSIS — I341 Nonrheumatic mitral (valve) prolapse: Secondary | ICD-10-CM | POA: Diagnosis not present

## 2017-04-29 DIAGNOSIS — E785 Hyperlipidemia, unspecified: Secondary | ICD-10-CM

## 2017-04-29 LAB — COMPREHENSIVE METABOLIC PANEL
A/G RATIO: 2.5 — AB (ref 1.2–2.2)
ALT: 29 IU/L (ref 0–44)
AST: 24 IU/L (ref 0–40)
Albumin: 5 g/dL (ref 3.5–5.5)
Alkaline Phosphatase: 43 IU/L (ref 39–117)
BUN/Creatinine Ratio: 15 (ref 9–20)
BUN: 16 mg/dL (ref 6–24)
Bilirubin Total: 0.5 mg/dL (ref 0.0–1.2)
CO2: 20 mmol/L (ref 18–29)
CREATININE: 1.09 mg/dL (ref 0.76–1.27)
Calcium: 9.3 mg/dL (ref 8.7–10.2)
Chloride: 101 mmol/L (ref 96–106)
GFR, EST AFRICAN AMERICAN: 90 mL/min/{1.73_m2} (ref >59)
GFR, EST NON AFRICAN AMERICAN: 78 mL/min/{1.73_m2} (ref >59)
GLOBULIN, TOTAL: 2 g/dL (ref 1.5–4.5)
Glucose: 85 mg/dL (ref 65–99)
Potassium: 4.4 mmol/L (ref 3.5–5.2)
Sodium: 141 mmol/L (ref 134–144)
TOTAL PROTEIN: 7 g/dL (ref 6.0–8.5)

## 2017-04-29 LAB — LIPID PANEL
CHOL/HDL RATIO: 2.7 ratio (ref 0.0–5.0)
Cholesterol, Total: 134 mg/dL (ref 100–199)
HDL: 49 mg/dL (ref >39)
LDL CALC: 72 mg/dL (ref 0–99)
Triglycerides: 66 mg/dL (ref 0–149)
VLDL Cholesterol Cal: 13 mg/dL (ref 5–40)

## 2017-04-29 NOTE — Progress Notes (Signed)
Cardiology Office Note    Date:  04/29/2017   ID:  Calvin [name], DOB 1966/04/08, MRN [id number]  PCP:  Maryland Pink, MD  Cardiologist: Sinclair Grooms, MD   Chief Complaint  Patient presents with  . Coronary Artery Disease    History of Present Illness:  Calvin [name] is a 51 y.o. male who presents for follow-up of CAD, DES to first diagonal and DES to LAD March 2015. Residual 50-60% RCA stenosis. Dual antiplatelet therapy stopped one year ago. Hyperlipidemia is only other medical problem.  Fatigue and exertional exhaustion. Denies orthopnea and PND. Can sleep up to 12 hours per day. Has no enthusiasm for exercise. No chest discomfort. No palpitations or episodes of syncope.  Past Medical History:  Diagnosis Date  . ACS (acute coronary syndrome) with NSTEMI 03/25/2014  . Acute sinus infection 03/27/2014  . Asthma   . Asthmatic bronchitis   . CAD (coronary artery disease), native coronary artery, DES placed to 1st diag. 03/26/14; residual disease to LAD with FFR to 0.62 and RCA of 50-60%- plan for PCI to LAD 03/27/2014  . Dyslipidemia, goal LDL below 70 03/27/2014  . GI bleed 03/27/2014  . Hyperlipemia   . MVP (mitral valve prolapse)    Dr Ubaldo Glassing  . Non Q wave myocardial infarction ([place]) 03/23/2014  . Psoriasis   . Rhinitis     Past Surgical History:  Procedure Laterality Date  . CORONARY ANGIOPLASTY WITH STENT PLACEMENT  03/25/2014; 04/16/2014   "1 + 1"  . FLEXIBLE SIGMOIDOSCOPY Left 03/28/2014   Procedure: FLEXIBLE SIGMOIDOSCOPY;  Surgeon: Beryle Beams, MD;  Location: [facility];  Service: Endoscopy;  Laterality: Left;  . LEFT HEART CATHETERIZATION WITH CORONARY ANGIOGRAM N/A 03/26/2014   Procedure: LEFT HEART CATHETERIZATION WITH CORONARY ANGIOGRAM;  Surgeon: Sinclair Grooms, MD;  Location: [facility] CATH LAB;  Service: Cardiovascular;  Laterality: N/A;  . NO PAST SURGERIES    . PERCUTANEOUS CORONARY STENT INTERVENTION (PCI-S) N/A 04/16/2014   Procedure: PERCUTANEOUS CORONARY STENT  INTERVENTION (PCI-S);  Surgeon: Sinclair Grooms, MD;  Location: [facility] CATH LAB;  Service: Cardiovascular;  Laterality: N/A;    Current Medications: Outpatient Medications Prior to Visit  Medication Sig Dispense Refill  . albuterol (PROAIR HFA) 108 (90 BASE) MCG/ACT inhaler Inhale 2 puffs into the lungs every 6 (six) hours as needed for wheezing or shortness of breath. 1 Inhaler prn  . aspirin 81 MG tablet Take 1 tablet (81 mg total) by mouth daily.    Marland Kitchen atorvastatin (LIPITOR) 20 MG tablet TAKE 1 TABLET BY MOUTH DAILY. 90 tablet 2  . azelastine (ASTELIN) 0.1 % nasal spray Place 2 sprays into both nostrils 2 (two) times daily.     . fenofibrate 160 MG tablet Take 160 mg by mouth daily.     . fluticasone (FLONASE) 50 MCG/ACT nasal spray Place 1 spray into both nostrils daily as needed for allergies.    . Hypertonic Nasal Wash (SINUS RINSE KIT) PACK Place 50 mLs into the nose daily.     . mometasone-formoterol (DULERA) 100-5 MCG/ACT AERO Inhale 1 puff into the lungs 2 (two) times daily.    . nitroGLYCERIN (NITROSTAT) 0.4 MG SL tablet Place 1 tablet (0.4 mg total) under the tongue every 5 (five) minutes as needed for chest pain. Up to 3 doses maximum 25 tablet 3  . Salicylic Acid (COMPOUND W EX) Apply 1 application topically daily as needed (psoriasis). Fluticasone cream 0.05% and Cetaphil Compound Cream    .  metoprolol tartrate (LOPRESSOR) 25 MG tablet Take 0.5 tablets (12.5 mg total) by mouth 2 (two) times daily. Take 1/2 tablet twice daily for 2 weeks then STOP     No facility-administered medications prior to visit.      Allergies:   Daliresp [roflumilast]   Social History   Social History  . Marital status: Married    Spouse name: N/A  . Number of children: 0  . Years of education: N/A   Occupational History  . computer tech    Social History Main Topics  . Smoking status: Never Smoker  . Smokeless tobacco: Never Used  . Alcohol use No  . Drug use: No  . Sexual activity: Yes    Other Topics Concern  . None   Social History Narrative  . None     Family History:  The patient's family history includes Asthma in his maternal grandmother; Emphysema in his maternal grandmother; Heart disease in his maternal uncle and mother; Hypertension in his maternal grandmother; Prostate cancer in his paternal grandfather; Stroke in his maternal uncle.   ROS:   Please see the history of present illness.    Excessive fatigue, shortness of breath, dizziness with activity, and headaches. Excessive daytime sleepiness.  All other systems reviewed and are negative.   PHYSICAL EXAM:   VS:  BP 124/64   Pulse 60   Ht '6\' 1"'  (1.854 m)   Wt 214 lb (97.1 kg)   BMI 28.23 kg/m    GEN: Well nourished, well developed, in no acute distress  HEENT: normal  Neck: no JVD, carotid bruits, or masses Cardiac: RRR; no murmurs, rubs, or gallops,no edema . Late peaking systolic murmur at the apex into the left axilla compatible with mitral regurgitation, grade 2-3/6. Respiratory:  clear to auscultation bilaterally, normal work of breathing GI: soft, nontender, nondistended, + BS MS: no deformity or atrophy  Skin: warm and dry, no rash Neuro:  Alert and Oriented x 3, Strength and sensation are intact Psych: euthymic mood, full affect  Wt Readings from Last 3 Encounters:  04/29/17 214 lb (97.1 kg)  08/06/16 213 lb 12.8 oz (97 kg)  05/17/16 206 lb (93.4 kg)      Studies/Labs Reviewed:   EKG:  EKG  Sinus bradycardia, prominent voltage, inferolateral T-wave abnormality. When compared to prior tracings the inferior T-wave abnormality is new.  Recent Labs: No results found for requested labs within last 8760 hours.   Lipid Panel    Component Value Date/Time   CHOL 119 09/24/2015 0750   TRIG 72.0 09/24/2015 0750   HDL 52.10 09/24/2015 0750   CHOLHDL 2 09/24/2015 0750   VLDL 14.4 09/24/2015 0750   LDLCALC 53 09/24/2015 0750    Additional studies/ records that were reviewed today  include:  Echocardiogram April 2017: Study Conclusions  - Left ventricle: The cavity size was normal. There was mild   concentric hypertrophy. Systolic function was vigorous. The   estimated ejection fraction was in the range of 65% to 70%. Left   ventricular diastolic function parameters were normal. - Mitral valve: Moderate, holosystolicprolapse, involving the   posterior leaflet. There was moderate to severe regurgitation   directed eccentrically.  Recommendations:  Consider TEE to better evaluate the mechanism and severity of the mitral insuficiency.        ASSESSMENT:    1. CAD in native artery   2. Billowing mitral valve   3. Dyslipidemia, goal LDL below 70      PLAN:  In  order of problems listed above:  1. Stable without angina. The T-wave changes on EKG of uncertain significance. I do not believe an ischemic evaluation is necessary. 2. Perhaps some of his exertional fatigue is related to progression of mitral regurgitation with low output. Plan to repeat 2-D Doppler echocardiogram to assess severity and LV size and function. 3. A comprehensive metabolic panel and lipid panel will be done.  Clinical follow-up in one year unless surprises are noted on the echocardiogram. I encouraged him to discuss his excessive daytime sleepiness with the primary physician.    Medication Adjustments/Labs and Tests Ordered: Current medicines are reviewed at length with the patient today.  Concerns regarding medicines are outlined above.  Medication changes, Labs and Tests ordered today are listed in the Patient Instructions below. There are no Patient Instructions on file for this visit.   Signed, Sinclair Grooms, MD  04/29/2017 8:50 AM    [facility] [address], [place], [place]  [address] Phone: [phone]; Fax: [phone]

## 2017-04-29 NOTE — Patient Instructions (Signed)
Medication Instructions:  None  Labwork: Lipid and CMET today  Testing/Procedures: Your physician has requested that you have an echocardiogram. Echocardiography is a painless test that uses sound waves to create images of your heart. It provides your doctor with information about the size and shape of your heart and how well your heart's chambers and valves are working. This procedure takes approximately one hour. There are no restrictions for this procedure.    Follow-Up: Your physician wants you to follow-up in: 1 year with Dr. Katrinka BlazingSmith.  You will receive a reminder letter in the mail two months in advance. If you don't receive a letter, please call our office to schedule the follow-up appointment.   Any Other Special Instructions Will Be Listed Below (If Applicable).     If you need a refill on your cardiac medications before your next appointment, please call your pharmacy.

## 2017-05-13 ENCOUNTER — Ambulatory Visit (HOSPITAL_COMMUNITY): Payer: BLUE CROSS/BLUE SHIELD | Attending: Cardiology

## 2017-05-13 ENCOUNTER — Other Ambulatory Visit: Payer: Self-pay

## 2017-05-13 DIAGNOSIS — I341 Nonrheumatic mitral (valve) prolapse: Secondary | ICD-10-CM | POA: Insufficient documentation

## 2017-05-13 DIAGNOSIS — I2510 Atherosclerotic heart disease of native coronary artery without angina pectoris: Secondary | ICD-10-CM | POA: Diagnosis not present

## 2017-05-13 DIAGNOSIS — R011 Cardiac murmur, unspecified: Secondary | ICD-10-CM | POA: Insufficient documentation

## 2017-05-13 DIAGNOSIS — I340 Nonrheumatic mitral (valve) insufficiency: Secondary | ICD-10-CM | POA: Diagnosis not present

## 2017-05-13 DIAGNOSIS — E785 Hyperlipidemia, unspecified: Secondary | ICD-10-CM | POA: Insufficient documentation

## 2017-06-02 ENCOUNTER — Other Ambulatory Visit: Payer: Self-pay | Admitting: Physician Assistant

## 2017-06-02 DIAGNOSIS — E785 Hyperlipidemia, unspecified: Secondary | ICD-10-CM

## 2017-07-19 DIAGNOSIS — D649 Anemia, unspecified: Secondary | ICD-10-CM | POA: Diagnosis not present

## 2017-07-19 DIAGNOSIS — E785 Hyperlipidemia, unspecified: Secondary | ICD-10-CM | POA: Diagnosis not present

## 2017-07-19 DIAGNOSIS — Z125 Encounter for screening for malignant neoplasm of prostate: Secondary | ICD-10-CM | POA: Diagnosis not present

## 2017-07-19 DIAGNOSIS — I25119 Atherosclerotic heart disease of native coronary artery with unspecified angina pectoris: Secondary | ICD-10-CM | POA: Diagnosis not present

## 2017-08-22 DIAGNOSIS — D72829 Elevated white blood cell count, unspecified: Secondary | ICD-10-CM | POA: Diagnosis not present

## 2017-09-20 DIAGNOSIS — J324 Chronic pansinusitis: Secondary | ICD-10-CM | POA: Diagnosis not present

## 2017-09-20 DIAGNOSIS — J310 Chronic rhinitis: Secondary | ICD-10-CM | POA: Diagnosis not present

## 2017-10-12 DIAGNOSIS — H5213 Myopia, bilateral: Secondary | ICD-10-CM | POA: Diagnosis not present

## 2017-10-12 DIAGNOSIS — G43909 Migraine, unspecified, not intractable, without status migrainosus: Secondary | ICD-10-CM | POA: Diagnosis not present

## 2017-10-12 DIAGNOSIS — H524 Presbyopia: Secondary | ICD-10-CM | POA: Diagnosis not present

## 2017-10-18 ENCOUNTER — Telehealth: Payer: Self-pay | Admitting: *Deleted

## 2017-10-18 DIAGNOSIS — I2510 Atherosclerotic heart disease of native coronary artery without angina pectoris: Secondary | ICD-10-CM
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
## 2017-11-29 DIAGNOSIS — D72829 Elevated white blood cell count, unspecified: Secondary | ICD-10-CM | POA: Diagnosis not present

## 2017-12-13 ENCOUNTER — Ambulatory Visit (INDEPENDENT_AMBULATORY_CARE_PROVIDER_SITE_OTHER): Payer: BLUE CROSS/BLUE SHIELD

## 2017-12-13 ENCOUNTER — Other Ambulatory Visit: Payer: Self-pay | Admitting: *Deleted

## 2017-12-13 DIAGNOSIS — I2510 Atherosclerotic heart disease of native coronary artery without angina pectoris: Secondary | ICD-10-CM | POA: Diagnosis not present

## 2017-12-13 MED ORDER — AMLODIPINE BESYLATE 5 MG PO TABS: 5.0000 mg | ORAL_TABLET | Freq: Every day | ORAL | 3 refills | Status: DC

## 2017-12-17 LAB — EXERCISE TOLERANCE TEST
CHL CUP MPHR: 168 {beats}/min
CHL CUP RESTING HR STRESS: 74 {beats}/min
CHL RATE OF PERCEIVED EXERTION: 14
CSEPEDS: 42 s
CSEPEW: 7 METS
CSEPPHR: 155 {beats}/min
Exercise duration (min): 5 min
Percent HR: 92 %

## 2018-01-22 NOTE — Progress Notes (Signed)
Cardiology Office Note    Date:  01/23/2018   ID:  Calvin [name], DOB 20-Mar-1966, MRN [id number]  PCP:  Maryland Pink, MD  Cardiologist: Sinclair Grooms, MD   Chief Complaint  Patient presents with  . Coronary Artery Disease    History of Present Illness:  Calvin [name] is a 52 y.o. male who presents for follow-up of CAD, DES to first diagonal and DES to LAD March 2015. Residual 50-60% RCA stenosis. Dual antiplatelet therapy stopped one year ago. Hyperlipidemia is only other medical problem.   Doing okay.  Denies chest pain.  No side effects related to medication use.  No episodes of syncope.   Past Medical History:  Diagnosis Date  . ACS (acute coronary syndrome) with NSTEMI 03/25/2014  . Acute sinus infection 03/27/2014  . Asthma   . Asthmatic bronchitis   . CAD (coronary artery disease), native coronary artery, DES placed to 1st diag. 03/26/14; residual disease to LAD with FFR to 0.62 and RCA of 50-60%- plan for PCI to LAD 03/27/2014  . Dyslipidemia, goal LDL below 70 03/27/2014  . GI bleed 03/27/2014  . Hyperlipemia   . MVP (mitral valve prolapse)    Dr Ubaldo Glassing  . Non Q wave myocardial infarction ([place]) 03/23/2014  . Psoriasis   . Rhinitis     Past Surgical History:  Procedure Laterality Date  . CORONARY ANGIOPLASTY WITH STENT PLACEMENT  03/25/2014; 04/16/2014   "1 + 1"  . FLEXIBLE SIGMOIDOSCOPY Left 03/28/2014   Procedure: FLEXIBLE SIGMOIDOSCOPY;  Surgeon: Beryle Beams, MD;  Location: [facility];  Service: Endoscopy;  Laterality: Left;  . LEFT HEART CATHETERIZATION WITH CORONARY ANGIOGRAM N/A 03/26/2014   Procedure: LEFT HEART CATHETERIZATION WITH CORONARY ANGIOGRAM;  Surgeon: Sinclair Grooms, MD;  Location: [facility] CATH LAB;  Service: Cardiovascular;  Laterality: N/A;  . NO PAST SURGERIES    . PERCUTANEOUS CORONARY STENT INTERVENTION (PCI-S) N/A 04/16/2014   Procedure: PERCUTANEOUS CORONARY STENT INTERVENTION (PCI-S);  Surgeon: Sinclair Grooms, MD;  Location: [facility] CATH LAB;   Service: Cardiovascular;  Laterality: N/A;    Current Medications: Outpatient Medications Prior to Visit  Medication Sig Dispense Refill  . albuterol (PROAIR HFA) 108 (90 BASE) MCG/ACT inhaler Inhale 2 puffs into the lungs every 6 (six) hours as needed for wheezing or shortness of breath. 1 Inhaler prn  . amLODipine (NORVASC) 5 MG tablet Take 1 tablet (5 mg total) by mouth daily. 90 tablet 3  . aspirin 81 MG tablet Take 1 tablet (81 mg total) by mouth daily.    Marland Kitchen atorvastatin (LIPITOR) 20 MG tablet TAKE 1 TABLET BY MOUTH DAILY. 90 tablet 3  . azelastine (ASTELIN) 0.1 % nasal spray Place 2 sprays into both nostrils 2 (two) times daily.     . fenofibrate 160 MG tablet Take 160 mg by mouth daily.     . fluticasone (FLONASE) 50 MCG/ACT nasal spray Place 1 spray into both nostrils daily as needed for allergies.    . Hypertonic Nasal Wash (SINUS RINSE KIT) PACK Place 50 mLs into the nose daily.     . mometasone-formoterol (DULERA) 100-5 MCG/ACT AERO Inhale 1 puff into the lungs 2 (two) times daily.    . nitroGLYCERIN (NITROSTAT) 0.4 MG SL tablet Place 1 tablet (0.4 mg total) under the tongue every 5 (five) minutes as needed for chest pain. Up to 3 doses maximum 25 tablet 3  . Salicylic Acid (COMPOUND W EX) Apply 1 application topically daily as needed (psoriasis). Fluticasone  cream 0.05% and Cetaphil Compound Cream     No facility-administered medications prior to visit.      Allergies:   Daliresp [roflumilast]   Social History   Socioeconomic History  . Marital status: Married    Spouse name: None  . Number of children: 0  . Years of education: None  . Highest education level: None  Social Needs  . Financial resource strain: None  . Food insecurity - worry: None  . Food insecurity - inability: None  . Transportation needs - medical: None  . Transportation needs - non-medical: None  Occupational History  . Occupation: Hotel manager  Tobacco Use  . Smoking status: Never Smoker  .  Smokeless tobacco: Never Used  Substance and Sexual Activity  . Alcohol use: No  . Drug use: No  . Sexual activity: Yes  Other Topics Concern  . None  Social History Narrative  . None     Family History:  The patient's family history includes Asthma in his maternal grandmother; Emphysema in his maternal grandmother; Heart disease in his maternal uncle and mother; Hypertension in his maternal grandmother; Prostate cancer in his paternal grandfather; Stroke in his maternal uncle.   ROS:   Please see the history of present illness.    Some shortness of breath with activity.  Occasional fleeting left lateral chest discomfort  All other systems reviewed and are negative.   PHYSICAL EXAM:   VS:  BP 122/84   Pulse 76   Ht 6' 1" (1.854 m)   Wt 208 lb 6.4 oz (94.5 kg)   BMI 27.50 kg/m    GEN: Well nourished, well developed, in no acute distress  HEENT: normal  Neck: no JVD, carotid bruits, or masses Cardiac: RRR with late systolic 2/6 systolic murmur at the apex and into the left axilla consistent with mitral regurgitation. No rubs, or gallops,no edema  Respiratory:  clear to auscultation bilaterally, normal work of breathing GI: soft, nontender, nondistended, + BS MS: no deformity or atrophy  Skin: warm and dry, no rash Neuro:  Alert and Oriented x 3, Strength and sensation are intact Psych: euthymic mood, full affect  Wt Readings from Last 3 Encounters:  01/23/18 208 lb 6.4 oz (94.5 kg)  04/29/17 214 lb (97.1 kg)  08/06/16 213 lb 12.8 oz (97 kg)      Studies/Labs Reviewed:   EKG:  EKG  Not performed.  Recent Labs: 04/29/2017: ALT 29; BUN 16; Creatinine, Ser 1.09; Potassium 4.4; Sodium 141   Lipid Panel    Component Value Date/Time   CHOL 134 04/29/2017 0904   TRIG 66 04/29/2017 0904   HDL 49 04/29/2017 0904   CHOLHDL 2.7 04/29/2017 0904   CHOLHDL 2 09/24/2015 0750   VLDL 14.4 09/24/2015 0750   LDLCALC 72 04/29/2017 0904    Additional studies/ records that were  reviewed today include:   Exercise treadmill test 12/13/17:   Study Highlights     Blood pressure demonstrated a hypertensive response to exercise.  Horizontal ST segment depression ST segment depression of 0.5 mm was noted during stress in the II, III, aVF, V5 and V6 leads, beginning at 5 minutes of stress, ending at 1 minutes of stress, and returning to baseline after less than 1 minute of recovery.    No evidence of ischemia  Hypertensive BP response.  No symptoms  Plan:  Add amlodipine 5 mg daily.      ASSESSMENT:    1. CAD in native artery   2.  Essential hypertension   3. Billowing mitral valve   4. Dyslipidemia, goal LDL below 70   5. Vasovagal syncope      PLAN:  In order of problems listed above:  1. No complaints. 2. Blood pressures well controlled.  Low-salt diet.  Continue amlodipine 5 mg/day. 3. No change in degree of mitral regurgitation based upon exam.   4. LDL cholesterol was was 72 in May. 5. No recurrence.  Overall stable.  Blood pressure improved.  Clinical follow-up in 1 year.  Low-salt diet.  Medication Adjustments/Labs and Tests Ordered: Current medicines are reviewed at length with the patient today.  Concerns regarding medicines are outlined above.  Medication changes, Labs and Tests ordered today are listed in the Patient Instructions below. There are no Patient Instructions on file for this visit.   Signed, Sinclair Grooms, MD  01/23/2018 10:50 AM    [facility] [address], [place], [place]  [address] Phone: [phone]; Fax: [phone]

## 2018-01-23 ENCOUNTER — Ambulatory Visit: Payer: BLUE CROSS/BLUE SHIELD | Admitting: Interventional Cardiology

## 2018-01-23 ENCOUNTER — Encounter: Payer: Self-pay | Admitting: Interventional Cardiology

## 2018-01-23 VITALS — BP 122/84 | HR 76 | Ht 73.0 in | Wt 208.4 lb

## 2018-01-23 DIAGNOSIS — I341 Nonrheumatic mitral (valve) prolapse: Secondary | ICD-10-CM

## 2018-01-23 DIAGNOSIS — R55 Syncope and collapse: Secondary | ICD-10-CM

## 2018-01-23 DIAGNOSIS — I10 Essential (primary) hypertension: Secondary | ICD-10-CM | POA: Diagnosis not present

## 2018-01-23 DIAGNOSIS — E785 Hyperlipidemia, unspecified: Secondary | ICD-10-CM

## 2018-01-23 DIAGNOSIS — I2510 Atherosclerotic heart disease of native coronary artery without angina pectoris: Secondary | ICD-10-CM | POA: Diagnosis not present

## 2018-01-23 NOTE — Patient Instructions (Signed)

## 2018-05-25 ENCOUNTER — Other Ambulatory Visit: Payer: Self-pay | Admitting: Interventional Cardiology

## 2018-05-25 DIAGNOSIS — E785 Hyperlipidemia, unspecified: Secondary | ICD-10-CM

## 2018-10-27 ENCOUNTER — Other Ambulatory Visit: Payer: Self-pay | Admitting: Interventional Cardiology

## 2018-12-25 ENCOUNTER — Other Ambulatory Visit: Payer: Self-pay

## 2018-12-25 ENCOUNTER — Telehealth: Payer: Self-pay | Admitting: Interventional Cardiology

## 2018-12-25 MED ORDER — AMLODIPINE BESYLATE 5 MG PO TABS: 5.0000 mg | ORAL_TABLET | Freq: Every day | ORAL | 0 refills | Status: DC

## 2018-12-25 NOTE — Telephone Encounter (Signed)
New Message     *STAT* If patient is at the pharmacy, call can be transferred to refill team.   1. Which medications need to be refilled? (please list name of each medication and dose if known) Amlodipine Besylate 5mg   2. Which pharmacy/location (including street and city if local pharmacy) is medication to be sent to[facility] [address] [place]  3. Do they need a 30 day or 90 day supply? 90 day supply

## 2019-02-07 NOTE — Progress Notes (Signed)
Cardiology Office Note:    Date:  02/08/2019   ID:  Calvin [name], DOB 23-Oct-1966, MRN [id number]  PCP:  Maryland Pink, MD  Cardiologist:  Sinclair Grooms, MD   Referring MD: Maryland Pink, MD   Chief Complaint  Patient presents with  . Coronary Artery Disease    History of Present Illness:    Calvin [name] is a 53 y.o. male with a hx of CAD,DES to first diagonal and DES to LAD March 2015. Residual 50-60% RCA stenosis. Dual antiplatelet therapy stopped one year ago. Hyperlipidemia is only other medical problem.   Statin therapy has been discontinued for uncertain reasons.  No palpitations.  Does snore some according to his wife.  He feels rested when he awakens.  He is able to get activity and at work.  No physical limitations.  He has not had chest pain or chest pain equivalent.  He is not limited in exertional tolerance.  He has had a lot of difficulty with his sinuses.  He has had coughing and phlegm production.  Some wheezing.  There is no peripheral edema.  Past Medical History:  Diagnosis Date  . ACS (acute coronary syndrome) with NSTEMI 03/25/2014  . Acute sinus infection 03/27/2014  . Asthma   . Asthmatic bronchitis   . CAD (coronary artery disease), native coronary artery, DES placed to 1st diag. 03/26/14; residual disease to LAD with FFR to 0.62 and RCA of 50-60%- plan for PCI to LAD 03/27/2014  . Dyslipidemia, goal LDL below 70 03/27/2014  . GI bleed 03/27/2014  . Hyperlipemia   . MVP (mitral valve prolapse)    Dr Ubaldo Glassing  . Non Q wave myocardial infarction ([place]) 03/23/2014  . Psoriasis   . Rhinitis     Past Surgical History:  Procedure Laterality Date  . CORONARY ANGIOPLASTY WITH STENT PLACEMENT  03/25/2014; 04/16/2014   "1 + 1"  . FLEXIBLE SIGMOIDOSCOPY Left 03/28/2014   Procedure: FLEXIBLE SIGMOIDOSCOPY;  Surgeon: Beryle Beams, MD;  Location: [facility];  Service: Endoscopy;  Laterality: Left;  . LEFT HEART CATHETERIZATION WITH CORONARY ANGIOGRAM N/A 03/26/2014   Procedure: LEFT HEART CATHETERIZATION WITH CORONARY ANGIOGRAM;  Surgeon: Sinclair Grooms, MD;  Location[facility] C[facility] LAB;  Service: Cardiovascular;  Laterality: N/A;  . NO PAST SURGERIES    . PERCUTANEOUS CORONARY STENT INTERVENTION (PCI-S) N/A 04/16/2014   Procedure: PERCUTANEOUS CORONARY STENT INTERVENTION (PCI-S);  Surgeon: Sinclair Grooms, MD;  Location: [facility] CATH LAB;  Service: Cardiovascular;  Laterality: N/A;    Current Medications: Current Meds  Medication Sig  . albuterol (PROAIR HFA) 108 (90 BASE) MCG/ACT inhaler Inhale 2 puffs into the lungs every 6 (six) hours as needed for wheezing or shortness of breath.  Marland Kitchen amLODipine (NORVASC) 5 MG tablet Take 1 tablet (5 mg total) by mouth daily. Please call and schedule an appointment for further refills 1st attempt  . aspirin 81 MG tablet Take 1 tablet (81 mg total) by mouth daily.  Marland Kitchen azelastine (ASTELIN) 0.1 % nasal spray Place 2 sprays into both nostrils 2 (two) times daily.   . fenofibrate 160 MG tablet Take 160 mg by mouth daily.   . fluticasone (FLONASE) 50 MCG/ACT nasal spray Place 1 spray into both nostrils daily as needed for allergies.  . Hypertonic Nasal Wash (SINUS RINSE KIT) PACK Place 50 mLs into the nose daily.   . mometasone-formoterol (DULERA) 100-5 MCG/ACT AERO Inhale 1 puff into the lungs 2 (two) times daily.  . nitroGLYCERIN (NITROSTAT) 0.4  MG SL tablet Place 1 tablet (0.4 mg total) under the tongue every 5 (five) minutes as needed for chest pain. Up to 3 doses maximum  . Salicylic Acid (COMPOUND W EX) Apply 1 application topically daily as needed (psoriasis). Fluticasone cream 0.05% and Cetaphil Compound Cream     Allergies:   Daliresp [roflumilast]   Social History   Socioeconomic History  . Marital status: Married    Spouse name: Not on file  . Number of children: 0  . Years of education: Not on file  . Highest education level: Not on file  Occupational History  . Occupation: Hotel manager  Social Needs  .  Financial resource strain: Not on file  . Food insecurity:    Worry: Not on file    Inability: Not on file  . Transportation needs:    Medical: Not on file    Non-medical: Not on file  Tobacco Use  . Smoking status: Never Smoker  . Smokeless tobacco: Never Used  Substance and Sexual Activity  . Alcohol use: No  . Drug use: No  . Sexual activity: Yes  Lifestyle  . Physical activity:    Days per week: Not on file    Minutes per session: Not on file  . Stress: Not on file  Relationships  . Social connections:    Talks on phone: Not on file    Gets together: Not on file    Attends religious service: Not on file    Active member of club or organization: Not on file    Attends meetings of clubs or organizations: Not on file    Relationship status: Not on file  Other Topics Concern  . Not on file  Social History Narrative  . Not on file     Family History: The patient's family history includes Asthma in his maternal grandmother; Emphysema in his maternal grandmother; Heart disease in his maternal uncle and mother; Hypertension in his maternal grandmother; Prostate cancer in his paternal grandfather; Stroke in his maternal uncle. There is no history of Heart attack.  ROS:   Please see the history of present illness.    Cough, wheezing, increasing weight, decrease in physical activity relative to prior.  All other systems reviewed and are negative.  EKGs/Labs/Other Studies Reviewed:    The following studies were reviewed today: 2D. Doppler echocardiogram May 2018: Study Conclusions  - Left ventricle: The cavity size was mildly dilated. Systolic   function was normal. The estimated ejection fraction was in the   range of 55% to 60%. Wall motion was normal; there were no   regional wall motion abnormalities. Left ventricular diastolic   function parameters were normal. - Mitral valve: Diffuse thickening of the posterior leaflet,   consistent with myxomatous proliferation.  Severe,   holosystolicprolapse, involving the posterior leaflet. There was   moderate regurgitation. - Left atrium: The atrium was mildly dilated. - Pulmonic valve: There was trivial regurgitation. - Pulmonary arteries: Systolic pressure could not be accurately   estimated.  Impressions:  - Upper normal to mildly dilated LV dimensions with normal LVF EF   55-60%, mild LAE, trivial PR and severe holosystolic prolapse of   the posterior MV leaflet. There is at least moderate MR by   colorflow Doppler but ERO not obtained. Could consider TEE for   further evaluation of MR.   EKG:  EKG sinus rhythm, inferior nonspecific T wave abnormality.  Left ventricular hypertrophy.  Recent Labs: No results found for requested labs  within last 8760 hours.  Recent Lipid Panel    Component Value Date/Time   CHOL 134 04/29/2017 0904   TRIG 66 04/29/2017 0904   HDL 49 04/29/2017 0904   CHOLHDL 2.7 04/29/2017 0904   CHOLHDL 2 09/24/2015 0750   VLDL 14.4 09/24/2015 0750   LDLCALC 72 04/29/2017 0904    Physical Exam:    VS:  BP (!) 146/86   Pulse 75   Ht '6\' 1"'  (1.854 m)   Wt 226 lb 12.8 oz (102.9 kg)   SpO2 95%   BMI 29.92 kg/m     Wt Readings from Last 3 Encounters:  02/08/19 226 lb 12.8 oz (102.9 kg)  01/23/18 208 lb 6.4 oz (94.5 kg)  04/29/17 214 lb (97.1 kg)     GEN: Age is compatible with appearance. No acute distress HEENT: Normal NECK: No JVD. LYMPHATICS: No lymphadenopathy CARDIAC: RRR.  2/6 left mid sternal border systolic murmur, positive S4 gallop, no edema VASCULAR: 2+ bilateral radial and carotid pulses, no bruits RESPIRATORY:  Clear to auscultation without rales, wheezing or rhonchi  ABDOMEN: Soft, non-tender, non-distended, No pulsatile mass, MUSCULOSKELETAL: No deformity  SKIN: Warm and dry NEUROLOGIC:  Alert and oriented x 3 PSYCHIATRIC:  Normal affect   ASSESSMENT:    1. CAD in native artery   2. Dyslipidemia, goal LDL below 70   3. Billowing mitral  valve   4. Vasovagal syncope   5. Wheezing    PLAN:    In order of problems listed above:  1. Stable from CAD standpoint.  Secondary prevention discussed in detail. 2. LDL cholesterol be assessed today.  He stopped statin therapy.  We discussed the importance of LDL less than 70/50 3. Ronalee Belts regurgitation murmur seems louder and is in the 2-3 over 6 range.  That in conjunction with cough, wheezing, and dyspnea on exertion raises the question of worsening MR.  He needs a 2D Doppler echocardiogram done sooner rather than later to follow-up on mitral regurg 4. No recurrence of vasovagal syncope. 5. Wheezing and dyspnea on exertion uncertain etiology.  BNP is going to be performed today.  Overall education and awareness concerning primary/secondary risk prevention was discussed in detail: LDL less than 70, hemoglobin A1c less than 7, blood pressure target less than 130/80 mmHg, >150 minutes of moderate aerobic activity per week, avoidance of smoking, weight control (via diet and exercise), and continued surveillance/management of/for obstructive sleep apnea.   Plan clinical follow-up in 1 year unless repeat echocardiogram demonstrates worsening MR or enlargement and LV size.  May need to consider a transesophageal echo.  Encouraged weight loss.  Clinical follow-up in 1 year.  Medication Adjustments/Labs and Tests Ordered: Current medicines are reviewed at length with the patient today.  Concerns regarding medicines are outlined above.  Orders Placed This Encounter  Procedures  . DG Chest 2 View  . Basic metabolic panel  . HgB A1c  . Hepatic function panel  . Lipid panel  . EKG 12-Lead   No orders of the defined types were placed in this encounter.   Patient Instructions  Medication Instructions:  Your physician recommends that you continue on your current medications as directed. Please refer to the Current Medication list given to you today.  If you need a refill on your cardiac  medications before your next appointment, please call your pharmacy.   Lab work: BMET, A1C, Lipid, Liver today If you have labs (blood work) drawn today and your tests are completely normal, you will receive  your results only by: Marland Kitchen MyChart Message (if you have MyChart) OR . A paper copy in the mail If you have any lab test that is abnormal or we need to change your treatment, we will call you to review the results.  Testing/Procedures: A chest x-ray takes a picture of the organs and structures inside the chest, including the heart, lungs, and blood vessels. This test can show several things, including, whether the heart is enlarges; whether fluid is building up in the lungs; and whether pacemaker / defibrillator leads are still in place.  Follow-Up: At [facility], you and your health needs are our priority.  As part of our continuing mission to provide you with exceptional heart care, we have created designated Provider Care Teams.  These Care Teams include your primary Cardiologist (physician) and Advanced Practice Providers (APPs -  Physician Assistants and Nurse Practitioners) who all work together to provide you with the care you need, when you need it. You will need a follow up appointment in 12 months.  Please call our office 2 months in advance to schedule this appointment.  You may see Sinclair Grooms, MD or one of the following Advanced Practice Providers on your designated Care Team:   Truitt Merle, NP Cecilie Kicks, NP . Kathyrn Drown, NP  Any Other Special Instructions Will Be Listed Below (If Applicable).  Monitor your blood pressures about 5-6 times over the next couple of weeks and contact the office with those numbers.  Make sure this is about 2-6 hours after your medications.       Signed, Sinclair Grooms, MD  02/08/2019 9:43 AM    [place]

## 2019-02-07 NOTE — H&P (View-Only) (Signed)
Cardiology Office Note:    Date:  02/08/2019   ID:  Calvin [name], DOB Jan 13, 1966, MRN [id number]  PCP:  [place] Pink, MD  Cardiologist:  Sinclair Grooms, MD   Referring MD: [place] Pink, MD   Chief Complaint  Patient presents with  . Coronary Artery Disease    History of Present Illness:    Calvin [name] is a 53 y.o. male with a hx of CAD,DES to first diagonal and DES to LAD March 2015. Residual 50-60% RCA stenosis. Dual antiplatelet therapy stopped one year ago. Hyperlipidemia is only other medical problem.   Statin therapy has been discontinued for uncertain reasons.  No palpitations.  Does snore some according to his wife.  He feels rested when he awakens.  He is able to get activity and at work.  No physical limitations.  He has not had chest pain or chest pain equivalent.  He is not limited in exertional tolerance.  He has had a lot of difficulty with his sinuses.  He has had coughing and phlegm production.  Some wheezing.  There is no peripheral edema.  Past Medical History:  Diagnosis Date  . ACS (acute coronary syndrome) with NSTEMI 03/25/2014  . Acute sinus infection 03/27/2014  . Asthma   . Asthmatic bronchitis   . CAD (coronary artery disease), native coronary artery, DES placed to 1st diag. 03/26/14; residual disease to LAD with FFR to 0.62 and RCA of 50-60%- plan for PCI to LAD 03/27/2014  . Dyslipidemia, goal LDL below 70 03/27/2014  . GI bleed 03/27/2014  . Hyperlipemia   . MVP (mitral valve prolapse)    Dr Ubaldo Glassing  . Non Q wave myocardial infarction ([place]) 03/23/2014  . Psoriasis   . Rhinitis     Past Surgical History:  Procedure Laterality Date  . CORONARY ANGIOPLASTY WITH STENT PLACEMENT  03/25/2014; 04/16/2014   "1 + 1"  . FLEXIBLE SIGMOIDOSCOPY Left 03/28/2014   Procedure: FLEXIBLE SIGMOIDOSCOPY;  Surgeon: Beryle Beams, MD;  Location: [place];  Service: Endoscopy;  Laterality: Left;  . LEFT HEART CATHETERIZATION WITH CORONARY ANGIOGRAM N/A 03/26/2014   Procedure: LEFT HEART CATHETERIZATION WITH CORONARY ANGIOGRAM;  Surgeon: Sinclair Grooms, MD;  Location: [facility] CATH LAB;  Service: Cardiovascular;  Laterality: N/A;  . NO PAST SURGERIES    . PERCUTANEOUS CORONARY STENT INTERVENTION (PCI-S) N/A 04/16/2014   Procedure: PERCUTANEOUS CORONARY STENT INTERVENTION (PCI-S);  Surgeon: Sinclair Grooms, MD;  Location: [facility] CATH LAB;  Service: Cardiovascular;  Laterality: N/A;    Current Medications: Current Meds  Medication Sig  . albuterol (PROAIR HFA) 108 (90 BASE) MCG/ACT inhaler Inhale 2 puffs into the lungs every 6 (six) hours as needed for wheezing or shortness of breath.  Marland Kitchen amLODipine (NORVASC) 5 MG tablet Take 1 tablet (5 mg total) by mouth daily. Please call and schedule an appointment for further refills 1st attempt  . aspirin 81 MG tablet Take 1 tablet (81 mg total) by mouth daily.  Marland Kitchen azelastine (ASTELIN) 0.1 % nasal spray Place 2 sprays into both nostrils 2 (two) times daily.   . fenofibrate 160 MG tablet Take 160 mg by mouth daily.   . fluticasone (FLONASE) 50 MCG/ACT nasal spray Place 1 spray into both nostrils daily as needed for allergies.  . Hypertonic Nasal Wash (SINUS RINSE KIT) PACK Place 50 mLs into the nose daily.   . mometasone-formoterol (DULERA) 100-5 MCG/ACT AERO Inhale 1 puff into the lungs 2 (two) times daily.  . nitroGLYCERIN (NITROSTAT) 0.4  MG SL tablet Place 1 tablet (0.4 mg total) under the tongue every 5 (five) minutes as needed for chest pain. Up to 3 doses maximum  . Salicylic Acid (COMPOUND W EX) Apply 1 application topically daily as needed (psoriasis). Fluticasone cream 0.05% and Cetaphil Compound Cream     Allergies:   Daliresp [roflumilast]   Social History   Socioeconomic History  . Marital status: Married    Spouse name: Not on file  . Number of children: 0  . Years of education: Not on file  . Highest education level: Not on file  Occupational History  . Occupation: Hotel manager  Social Needs  .  Financial resource strain: Not on file  . Food insecurity:    Worry: Not on file    Inability: Not on file  . Transportation needs:    Medical: Not on file    Non-medical: Not on file  Tobacco Use  . Smoking status: Never Smoker  . Smokeless tobacco: Never Used  Substance and Sexual Activity  . Alcohol use: No  . Drug use: No  . Sexual activity: Yes  Lifestyle  . Physical activity:    Days per week: Not on file    Minutes per session: Not on file  . Stress: Not on file  Relationships  . Social connections:    Talks on phone: Not on file    Gets together: Not on file    Attends religious service: Not on file    Active member of club or organization: Not on file    Attends meetings of clubs or organizations: Not on file    Relationship status: Not on file  Other Topics Concern  . Not on file  Social History Narrative  . Not on file     Family History: The patient's family history includes Asthma in his maternal grandmother; Emphysema in his maternal grandmother; Heart disease in his maternal uncle and mother; Hypertension in his maternal grandmother; Prostate cancer in his paternal grandfather; Stroke in his maternal uncle. There is no history of Heart attack.  ROS:   Please see the history of present illness.    Cough, wheezing, increasing weight, decrease in physical activity relative to prior.  All other systems reviewed and are negative.  EKGs/Labs/Other Studies Reviewed:    The following studies were reviewed today: 2D. Doppler echocardiogram May 2018: Study Conclusions  - Left ventricle: The cavity size was mildly dilated. Systolic   function was normal. The estimated ejection fraction was in the   range of 55% to 60%. Wall motion was normal; there were no   regional wall motion abnormalities. Left ventricular diastolic   function parameters were normal. - Mitral valve: Diffuse thickening of the posterior leaflet,   consistent with myxomatous proliferation.  Severe,   holosystolicprolapse, involving the posterior leaflet. There was   moderate regurgitation. - Left atrium: The atrium was mildly dilated. - Pulmonic valve: There was trivial regurgitation. - Pulmonary arteries: Systolic pressure could not be accurately   estimated.  Impressions:  - Upper normal to mildly dilated LV dimensions with normal LVF EF   55-60%, mild LAE, trivial PR and severe holosystolic prolapse of   the posterior MV leaflet. There is at least moderate MR by   colorflow Doppler but ERO not obtained. Could consider TEE for   further evaluation of MR.   EKG:  EKG sinus rhythm, inferior nonspecific T wave abnormality.  Left ventricular hypertrophy.  Recent Labs: No results found for requested labs  within last 8760 hours.  Recent Lipid Panel    Component Value Date/Time   CHOL 134 04/29/2017 0904   TRIG 66 04/29/2017 0904   HDL 49 04/29/2017 0904   CHOLHDL 2.7 04/29/2017 0904   CHOLHDL 2 09/24/2015 0750   VLDL 14.4 09/24/2015 0750   LDLCALC 72 04/29/2017 0904    Physical Exam:    VS:  BP (!) 146/86   Pulse 75   Ht '6\' 1"'  (1.854 m)   Wt 226 lb 12.8 oz (102.9 kg)   SpO2 95%   BMI 29.92 kg/m     Wt Readings from Last 3 Encounters:  02/08/19 226 lb 12.8 oz (102.9 kg)  01/23/18 208 lb 6.4 oz (94.5 kg)  04/29/17 214 lb (97.1 kg)     GEN: Age is compatible with appearance. No acute distress HEENT: Normal NECK: No JVD. LYMPHATICS: No lymphadenopathy CARDIAC: RRR.  2/6 left mid sternal border systolic murmur, positive S4 gallop, no edema VASCULAR: 2+ bilateral radial and carotid pulses, no bruits RESPIRATORY:  Clear to auscultation without rales, wheezing or rhonchi  ABDOMEN: Soft, non-tender, non-distended, No pulsatile mass, MUSCULOSKELETAL: No deformity  SKIN: Warm and dry NEUROLOGIC:  Alert and oriented x 3 PSYCHIATRIC:  Normal affect   ASSESSMENT:    1. CAD in native artery   2. Dyslipidemia, goal LDL below 70   3. Billowing mitral  valve   4. Vasovagal syncope   5. Wheezing    PLAN:    In order of problems listed above:  1. Stable from CAD standpoint.  Secondary prevention discussed in detail. 2. LDL cholesterol be assessed today.  He stopped statin therapy.  We discussed the importance of LDL less than 70/50 3. Ronalee Belts regurgitation murmur seems louder and is in the 2-3 over 6 range.  That in conjunction with cough, wheezing, and dyspnea on exertion raises the question of worsening MR.  He needs a 2D Doppler echocardiogram done sooner rather than later to follow-up on mitral regurg 4. No recurrence of vasovagal syncope. 5. Wheezing and dyspnea on exertion uncertain etiology.  BNP is going to be performed today.  Overall education and awareness concerning primary/secondary risk prevention was discussed in detail: LDL less than 70, hemoglobin A1c less than 7, blood pressure target less than 130/80 mmHg, >150 minutes of moderate aerobic activity per week, avoidance of smoking, weight control (via diet and exercise), and continued surveillance/management of/for obstructive sleep apnea.   Plan clinical follow-up in 1 year unless repeat echocardiogram demonstrates worsening MR or enlargement and LV size.  May need to consider a transesophageal echo.  Encouraged weight loss.  Clinical follow-up in 1 year.  Medication Adjustments/Labs and Tests Ordered: Current medicines are reviewed at length with the patient today.  Concerns regarding medicines are outlined above.  Orders Placed This Encounter  Procedures  . DG Chest 2 View  . Basic metabolic panel  . HgB A1c  . Hepatic function panel  . Lipid panel  . EKG 12-Lead   No orders of the defined types were placed in this encounter.   Patient Instructions  Medication Instructions:  Your physician recommends that you continue on your current medications as directed. Please refer to the Current Medication list given to you today.  If you need a refill on your cardiac  medications before your next appointment, please call your pharmacy.   Lab work: BMET, A1C, Lipid, Liver today If you have labs (blood work) drawn today and your tests are completely normal, you will receive  your results only by: Marland Kitchen MyChart Message (if you have MyChart) OR . A paper copy in the mail If you have any lab test that is abnormal or we need to change your treatment, we will call you to review the results.  Testing/Procedures: A chest x-ray takes a picture of the organs and structures inside the chest, including the heart, lungs, and blood vessels. This test can show several things, including, whether the heart is enlarges; whether fluid is building up in the lungs; and whether pacemaker / defibrillator leads are still in place.  Follow-Up: At [facility], you and your health needs are our priority.  As part of our continuing mission to provide you with exceptional heart care, we have created designated Provider Care Teams.  These Care Teams include your primary Cardiologist (physician) and Advanced Practice Providers (APPs -  Physician Assistants and Nurse Practitioners) who all work together to provide you with the care you need, when you need it. You will need a follow up appointment in 12 months.  Please call our office 2 months in advance to schedule this appointment.  You may see Sinclair Grooms, MD or one of the following Advanced Practice Providers on your designated Care Team:   Truitt Merle, NP Cecilie Kicks, NP . Kathyrn Drown, NP  Any Other Special Instructions Will Be Listed Below (If Applicable).  Monitor your blood pressures about 5-6 times over the next couple of weeks and contact the office with those numbers.  Make sure this is about 2-6 hours after your medications.       Signed, Sinclair Grooms, MD  02/08/2019 9:43 AM    [place]

## 2019-02-08 ENCOUNTER — Encounter: Payer: Self-pay | Admitting: Interventional Cardiology

## 2019-02-08 ENCOUNTER — Ambulatory Visit: Payer: BLUE CROSS/BLUE SHIELD | Admitting: Interventional Cardiology

## 2019-02-08 ENCOUNTER — Ambulatory Visit
Admission: RE | Admit: 2019-02-08 | Discharge: 2019-02-08 | Disposition: A | Discharge status: Home / Self Care | Payer: BLUE CROSS/BLUE SHIELD | Source: Ambulatory Visit | Attending: Interventional Cardiology | Admitting: Interventional Cardiology

## 2019-02-08 VITALS — BP 146/86 | HR 75 | Ht 73.0 in | Wt 226.8 lb

## 2019-02-08 DIAGNOSIS — I341 Nonrheumatic mitral (valve) prolapse: Secondary | ICD-10-CM | POA: Diagnosis not present

## 2019-02-08 DIAGNOSIS — E785 Hyperlipidemia, unspecified: Secondary | ICD-10-CM

## 2019-02-08 DIAGNOSIS — R55 Syncope and collapse: Secondary | ICD-10-CM | POA: Diagnosis not present

## 2019-02-08 DIAGNOSIS — R05 Cough: Secondary | ICD-10-CM | POA: Diagnosis not present

## 2019-02-08 DIAGNOSIS — I2510 Atherosclerotic heart disease of native coronary artery without angina pectoris: Secondary | ICD-10-CM

## 2019-02-08 DIAGNOSIS — R0602 Shortness of breath: Secondary | ICD-10-CM

## 2019-02-08 DIAGNOSIS — R062 Wheezing: Secondary | ICD-10-CM

## 2019-02-08 DIAGNOSIS — I340 Nonrheumatic mitral (valve) insufficiency: Secondary | ICD-10-CM

## 2019-02-08 NOTE — Patient Instructions (Addendum)
Medication Instructions:  Your physician recommends that you continue on your current medications as directed. Please refer to the Current Medication list given to you today.  If you need a refill on your cardiac medications before your next appointment, please call your pharmacy.   Lab work: BMET, A1C, Lipid, Liver today If you have labs (blood work) drawn today and your tests are completely normal, you will receive your results only by: Marland Kitchen. MyChart Message (if you have MyChart) OR . A paper copy in the mail If you have any lab test that is abnormal or we need to change your treatment, we will call you to review the results.  Testing/Procedures: A chest x-ray takes a picture of the organs and structures inside the chest, including the heart, lungs, and blood vessels. This test can show several things, including, whether the heart is enlarges; whether fluid is building up in the lungs; and whether pacemaker / defibrillator leads are still in place.  Follow-Up: At [facility], you and your health needs are our priority.  As part of our continuing mission to provide you with exceptional heart care, we have created designated Provider Care Teams.  These Care Teams include your primary Cardiologist (physician) and Advanced Practice Providers (APPs -  Physician Assistants and Nurse Practitioners) who all work together to provide you with the care you need, when you need it. You will need a follow up appointment in 12 months.  Please call our office 2 months in advance to schedule this appointment.  You may see Lesleigh NoeHenry W Smith III, MD or one of the following Advanced Practice Providers on your designated Care Team:   Norma FredricksonLori Gerhardt, NP Nada BoozerLaura Ingold, NP . Georgie ChardJill McDaniel, NP  Any Other Special Instructions Will Be Listed Below (If Applicable).  Monitor your blood pressures about 5-6 times over the next couple of weeks and contact the office with those numbers.  Make sure this is about 2-6 hours after  your medications.

## 2019-02-09 LAB — HEPATIC FUNCTION PANEL
ALBUMIN: 5 g/dL — AB (ref 3.8–4.9)
ALK PHOS: 54 IU/L (ref 39–117)
ALT: 35 IU/L (ref 0–44)
AST: 25 IU/L (ref 0–40)
BILIRUBIN TOTAL: 0.3 mg/dL (ref 0.0–1.2)
Bilirubin, Direct: 0.11 mg/dL (ref 0.00–0.40)
TOTAL PROTEIN: 7.1 g/dL (ref 6.0–8.5)

## 2019-02-09 LAB — BASIC METABOLIC PANEL
BUN/Creatinine Ratio: 14 (ref 9–20)
BUN: 15 mg/dL (ref 6–24)
CO2: 19 mmol/L — AB (ref 20–29)
CREATININE: 1.09 mg/dL (ref 0.76–1.27)
Calcium: 10 mg/dL (ref 8.7–10.2)
Chloride: 104 mmol/L (ref 96–106)
GFR calc Af Amer: 89 mL/min/{1.73_m2} (ref >59)
GFR, EST NON AFRICAN AMERICAN: 77 mL/min/{1.73_m2} (ref >59)
GLUCOSE: 98 mg/dL (ref 65–99)
POTASSIUM: 4.8 mmol/L (ref 3.5–5.2)
SODIUM: 139 mmol/L (ref 134–144)

## 2019-02-09 LAB — PRO B NATRIURETIC PEPTIDE: NT-Pro BNP: 37 pg/mL (ref 0–121)

## 2019-02-09 LAB — HEMOGLOBIN A1C
Est. average glucose Bld gHb Est-mCnc: 117 mg/dL
Hgb A1c MFr Bld: 5.7 % — ABNORMAL HIGH (ref 4.8–5.6)

## 2019-02-09 LAB — LIPID PANEL
CHOL/HDL RATIO: 3.9 ratio (ref 0.0–5.0)
Cholesterol, Total: 185 mg/dL (ref 100–199)
HDL: 47 mg/dL (ref >39)
LDL CALC: 102 mg/dL — AB (ref 0–99)
TRIGLYCERIDES: 179 mg/dL — AB (ref 0–149)
VLDL CHOLESTEROL CAL: 36 mg/dL (ref 5–40)

## 2019-02-13 ENCOUNTER — Telehealth: Payer: Self-pay | Admitting: *Deleted

## 2019-02-13 ENCOUNTER — Ambulatory Visit (HOSPITAL_COMMUNITY): Payer: BLUE CROSS/BLUE SHIELD | Attending: Internal Medicine

## 2019-02-13 DIAGNOSIS — I340 Nonrheumatic mitral (valve) insufficiency: Secondary | ICD-10-CM | POA: Insufficient documentation

## 2019-02-13 DIAGNOSIS — E785 Hyperlipidemia, unspecified: Secondary | ICD-10-CM

## 2019-02-13 DIAGNOSIS — R0602 Shortness of breath: Secondary | ICD-10-CM | POA: Diagnosis not present

## 2019-02-13 MED ORDER — ROSUVASTATIN CALCIUM 5 MG PO TABS: 5.0000 mg | ORAL_TABLET | Freq: Every day | ORAL | 3 refills | Status: DC

## 2019-02-13 NOTE — Telephone Encounter (Signed)
-----   Message from [name], MD sent at 02/10/2019 12:08 PM EST ----- Let the patient know labs look okay but the LDL is too high. Start Rosuvastatin 5 mg daily. Liver nad lipid panel in 6-8 weeks A copy will be sent to [name], MD

## 2019-02-13 NOTE — Telephone Encounter (Signed)
Pt in the office today for an echo.  Reviewed lab results and recommendations per Dr. [name]. Pt in agreement with plan.  Pt will have labs on 03/28/2019.  Advised to call sooner if any issues with medication.

## 2019-02-16 ENCOUNTER — Encounter: Payer: Self-pay | Admitting: *Deleted

## 2019-03-03 ENCOUNTER — Other Ambulatory Visit: Payer: Self-pay | Admitting: Interventional Cardiology

## 2019-03-03 DIAGNOSIS — I341 Nonrheumatic mitral (valve) prolapse: Secondary | ICD-10-CM

## 2019-03-05 ENCOUNTER — Other Ambulatory Visit: Payer: Self-pay

## 2019-03-05 ENCOUNTER — Encounter (HOSPITAL_COMMUNITY): Payer: Self-pay | Admitting: Certified Registered Nurse Anesthetist

## 2019-03-05 ENCOUNTER — Ambulatory Visit (HOSPITAL_BASED_OUTPATIENT_CLINIC_OR_DEPARTMENT_OTHER)
Admission: RE | Admit: 2019-03-05 | Discharge: 2019-03-05 | Disposition: A | Discharge status: Home / Self Care | Payer: BLUE CROSS/BLUE SHIELD | Source: Home / Self Care | Attending: Interventional Cardiology | Admitting: Interventional Cardiology

## 2019-03-05 ENCOUNTER — Ambulatory Visit (HOSPITAL_COMMUNITY): Payer: BLUE CROSS/BLUE SHIELD | Admitting: Anesthesiology

## 2019-03-05 ENCOUNTER — Ambulatory Visit (HOSPITAL_COMMUNITY)
Admission: RE | Admit: 2019-03-05 | Discharge: 2019-03-05 | Disposition: A | Discharge status: Home / Self Care | Payer: BLUE CROSS/BLUE SHIELD | Attending: Cardiovascular Disease | Admitting: Cardiovascular Disease

## 2019-03-05 ENCOUNTER — Encounter (HOSPITAL_COMMUNITY)
Admission: RE | Disposition: A | Discharge status: Home / Self Care | Payer: Self-pay | Source: Home / Self Care | Attending: Cardiovascular Disease

## 2019-03-05 DIAGNOSIS — I252 Old myocardial infarction: Secondary | ICD-10-CM | POA: Diagnosis not present

## 2019-03-05 DIAGNOSIS — I341 Nonrheumatic mitral (valve) prolapse: Secondary | ICD-10-CM

## 2019-03-05 DIAGNOSIS — Z79899 Other long term (current) drug therapy: Secondary | ICD-10-CM | POA: Diagnosis not present

## 2019-03-05 DIAGNOSIS — J45909 Unspecified asthma, uncomplicated: Secondary | ICD-10-CM | POA: Diagnosis not present

## 2019-03-05 DIAGNOSIS — Z955 Presence of coronary angioplasty implant and graft: Secondary | ICD-10-CM | POA: Diagnosis not present

## 2019-03-05 DIAGNOSIS — I2510 Atherosclerotic heart disease of native coronary artery without angina pectoris: Secondary | ICD-10-CM | POA: Insufficient documentation

## 2019-03-05 DIAGNOSIS — I340 Nonrheumatic mitral (valve) insufficiency: Secondary | ICD-10-CM

## 2019-03-05 DIAGNOSIS — E785 Hyperlipidemia, unspecified: Secondary | ICD-10-CM | POA: Insufficient documentation

## 2019-03-05 DIAGNOSIS — Z7982 Long term (current) use of aspirin: Secondary | ICD-10-CM | POA: Insufficient documentation

## 2019-03-05 DIAGNOSIS — I081 Rheumatic disorders of both mitral and tricuspid valves: Secondary | ICD-10-CM | POA: Diagnosis not present

## 2019-03-05 HISTORY — PX: TEE WITHOUT CARDIOVERSION: SHX5443

## 2019-03-05 SURGERY — ECHOCARDIOGRAM, TRANSESOPHAGEAL
Anesthesia: Monitor Anesthesia Care

## 2019-03-05 MED ORDER — PROPOFOL 500 MG/50ML IV EMUL
INTRAVENOUS | Status: DC | PRN
  Administered 2019-03-05: 200 ug/kg/min via INTRAVENOUS

## 2019-03-05 MED ORDER — SODIUM CHLORIDE 0.9 % IV SOLN
INTRAVENOUS | Status: DC
  Administered 2019-03-05 (×2): via INTRAVENOUS

## 2019-03-05 MED ORDER — PROPOFOL 10 MG/ML IV BOLUS
INTRAVENOUS | Status: DC | PRN
  Administered 2019-03-05: 50 mg via INTRAVENOUS

## 2019-03-05 NOTE — Progress Notes (Signed)
  Echocardiogram Echocardiogram Transesophageal with 3D has been performed.  Leta Jungling M 03/05/2019, 8:51 AM

## 2019-03-05 NOTE — Progress Notes (Signed)
Let the patient know the mitral valve is leaking severely.  He needs a left and right heart cath with coronary angiogram to assess hemodynamics.  May need to be referred for mitral valve repair after the study if it confirms that the valve is severe.

## 2019-03-05 NOTE — Transfer of Care (Signed)
Immediate Anesthesia Transfer of Care Note  Patient: Calvin [name]  Procedure(s) Performed: TRANSESOPHAGEAL ECHOCARDIOGRAM (TEE) (N/A )  Patient Location: Endoscopy Unit  Anesthesia Type:MAC  Level of Consciousness: awake, alert , oriented and patient cooperative  Airway & Oxygen Therapy: Patient Spontanous Breathing and Patient connected to nasal cannula oxygen  Post-op Assessment: Report given to RN and Post -op Vital signs reviewed and stable  Post vital signs: Reviewed and stable  Last Vitals:  Vitals Value Taken Time  BP 115/67 03/05/2019  8:43 AM  Temp    Pulse 84 03/05/2019  8:45 AM  Resp 17 03/05/2019  8:45 AM  SpO2 100 % 03/05/2019  8:45 AM  Vitals shown include unvalidated device data.  Last Pain:  Vitals:   03/05/19 0843  TempSrc:   PainSc: 0-No pain         Complications: No apparent anesthesia complications

## 2019-03-05 NOTE — Anesthesia Preprocedure Evaluation (Addendum)
Anesthesia Evaluation  Patient identified by MRN, date of birth, ID band Patient awake    Airway Mallampati: II  TM Distance: >3 FB     Dental   Pulmonary asthma ,    breath sounds clear to auscultation       Cardiovascular + angina + CAD and + Past MI   Rhythm:Regular Rate:Normal     Neuro/Psych    GI/Hepatic negative GI ROS, Neg liver ROS,   Endo/Other    Renal/GU Renal disease     Musculoskeletal   Abdominal   Peds  Hematology   Anesthesia Other Findings   Reproductive/Obstetrics                            Anesthesia Physical Anesthesia Plan  ASA: III  Anesthesia Plan: General   Post-op Pain Management:    Induction: Intravenous  PONV Risk Score and Plan: Treatment may vary due to age or medical condition  Airway Management Planned: Simple Face Mask and Nasal Cannula  Additional Equipment:   Intra-op Plan:   Post-operative Plan: Extubation in OR  Informed Consent: I have reviewed the patients History and Physical, chart, labs and discussed the procedure including the risks, benefits and alternatives for the proposed anesthesia with the patient or authorized representative who has indicated his/her understanding and acceptance.     Dental advisory given  Plan Discussed with: Anesthesiologist and CRNA  Anesthesia Plan Comments:        Anesthesia Quick Evaluation

## 2019-03-05 NOTE — Interval H&P Note (Signed)
History and Physical Interval Note:  03/05/2019 7:50 AM  Calvin [name]  has presented today for surgery, with the diagnosis of mitral regurgitation.  The various methods of treatment have been discussed with the patient and family. After consideration of risks, benefits and other options for treatment, the patient has consented to  Procedure(s): TRANSESOPHAGEAL ECHOCARDIOGRAM (TEE) (N/A) as a surgical intervention.  The patient's history has been reviewed, patient examined, no change in status, stable for surgery.  I have reviewed the patient's chart and labs.  Questions were answered to the patient's satisfaction.     [name], MD

## 2019-03-05 NOTE — Discharge Instructions (Signed)
Monitored Anesthesia Care Anesthesia is a term that refers to techniques, procedures, and medicines that help a person stay safe and comfortable during a medical procedure. Monitored anesthesia care, or sedation, is one type of anesthesia. Your anesthesia specialist may recommend sedation if you will be having a procedure that does not require you to be unconscious, such as:  Cataract surgery.  A dental procedure.  A biopsy.  A colonoscopy. During the procedure, you may receive a medicine to help you relax (sedative). There are three levels of sedation:  Mild sedation. At this level, you may feel awake and relaxed. You will be able to follow directions.  Moderate sedation. At this level, you will be sleepy. You may not remember the procedure.  Deep sedation. At this level, you will be asleep. You will not remember the procedure. The more medicine you are given, the deeper your level of sedation will be. Depending on how you respond to the procedure, the anesthesia specialist may change your level of sedation or the type of anesthesia to fit your needs. An anesthesia specialist will monitor you closely during the procedure. Let your health care provider know about:  Any allergies you have.  All medicines you are taking, including vitamins, herbs, eye drops, creams, and over-the-counter medicines.  Any use of steroids (by mouth or as a cream).  Any problems you or family members have had with sedatives and anesthetic medicines.  Any blood disorders you have.  Any surgeries you have had.  Any medical conditions you have, such as sleep apnea.  Whether you are pregnant or may be pregnant.  Any use of cigarettes, alcohol, or street drugs. What are the risks? Generally, this is a safe procedure. However, problems may occur, including:  Getting too much medicine (oversedation).  Nausea.  Allergic reaction to medicines.  Trouble breathing. If this happens, a breathing tube may be  used to help with breathing. It will be removed when you are awake and breathing on your own.  Heart trouble.  Lung trouble. Before the procedure Staying hydrated Follow instructions from your health care provider about hydration, which may include:  Up to 2 hours before the procedure - you may continue to drink clear liquids, such as water, clear fruit juice, black coffee, and plain tea. Eating and drinking restrictions Follow instructions from your health care provider about eating and drinking, which may include:  8 hours before the procedure - stop eating heavy meals or foods such as meat, fried foods, or fatty foods.  6 hours before the procedure - stop eating light meals or foods, such as toast or cereal.  6 hours before the procedure - stop drinking milk or drinks that contain milk.  2 hours before the procedure - stop drinking clear liquids. Medicines Ask your health care provider about:  Changing or stopping your regular medicines. This is especially important if you are taking diabetes medicines or blood thinners.  Taking medicines such as aspirin and ibuprofen. These medicines can thin your blood. Do not take these medicines before your procedure if your health care provider instructs you not to. Tests and exams  You will have a physical exam.  You may have blood tests done to show: ? How well your kidneys and liver are working. ? How well your blood can clot. General instructions  Plan to have someone take you home from the hospital or clinic.  If you will be going home right after the procedure, plan to have someone with you  for 24 hours.  What happens during the procedure?  Your blood pressure, heart rate, breathing, level of pain and overall condition will be monitored.  An IV tube will be inserted into one of your veins.  Your anesthesia specialist will give you medicines as needed to keep you comfortable during the procedure. This may mean changing the  level of sedation.  The procedure will be performed. After the procedure  Your blood pressure, heart rate, breathing rate, and blood oxygen level will be monitored until the medicines you were given have worn off.  Do not drive for 24 hours if you received a sedative.  You may: ? Feel sleepy, clumsy, or nauseous. ? Feel forgetful about what happened after the procedure. ? Have a sore throat if you had a breathing tube during the procedure. ? Vomit. This information is not intended to replace advice given to you by your health care provider. Make sure you discuss any questions you have with your health care provider. Document Released: 09/08/2005 Document Revised: 05/21/2016 Document Reviewed: 04/04/2016 Elsevier Interactive Patient Education  2019 [employer] Transesophageal Echocardiogram  Transesophageal echocardiogram (TEE) is a test that uses sound waves (echocardiogram) to produce very clear, detailed images of the heart. TEE is done by passing a flexible tube down the esophagus. The heart is located in front of the esophagus. TEE may be done:  To check how well your heart valves are working.  To check for any abnormal growth or tumor  To look for blood clots  To check for infection of the lining of the heart (endocarditis).  To evaluate the dividing wall (septum) of the heart and check for a hole that did not close after birth (patent foramen ovale or atrial septal defect).  To help diagnose a tear in the wall of the blood vessels (aortic dissection).  To look at the heart shape, size, and function. Any changes can be associated with certain conditions, including heart failure, aneurysm, and coronary heart disease, CAD.  During cardiac valve surgery. This allows the surgeon to assess the valve repair before closing the chest.  During a variety of other cardiac procedures to guide positioning of catheters.  To monitor your heart's response to IV fluids or  medicine. TEE is usually not a painful procedure. You may feel the probe press against the back of the throat. The probe does not enter the trachea and does not affect your breathing. Tell a health care provider about:  Any allergies you have.  All medicines you are taking, including vitamins, herbs, eye drops, creams, and over-the-counter medicines.  Any problems you or family members have had with anesthetic medicines.  Any blood disorders you have.  Any surgeries you have had.  Any medical conditions you have.  Any swallowing difficulties.  Whether you have or have had a blockage of the esophagus (esophageal obstruction).  Whether you are pregnant or may be pregnant. What are the risks? Generally, this is a safe procedure. However, problems may occur, including:  Damage to other structures or organs.  A tear of the esophagus (esophageal rupture).  Irregular heart beat (arrhythmia).  Hoarse voice or difficulty swallowing.  Bleeding (hemorrhage). What happens before the procedure? Staying hydrated Follow instructions from your health care provider about hydration, which may include:  Up to 3 hours before the procedure - you may continue to drink clear liquids, such as water, clear fruit juice, black coffee, and plain tea. Eating and drinking Follow instructions from your health care provider  about eating and drinking, which may include:  8 hours before the procedure - stop eating heavy meals or foods such as meat, fried foods, or fatty foods.  6 hours before the procedure - stop eating light meals or foods, such as toast or cereal.  6 hours before the procedure - stop drinking milk or drinks that contain milk.  3 hours before the procedure - stop drinking clear liquids. General instructions  You will need to remove any dentures or dental retainers.  Plan to have someone take you home from the hospital or clinic.  Plan to have a responsible adult care for you for  at least 24 hours after you leave the hospital or clinic. This is important.  Ask your health care provider about: ? Changing or stopping your normal medicines. This is important if you take diabetes medicines or blood thinners. ? Taking over-the-counter medicines, vitamins, herbs, and supplements. ? Taking medicines such as aspirin and ibuprofen. These medicines can thin your blood. Do not take these medicines unless your health care provider tells you to take them. What happens during the procedure?  To reduce your risk of infection: ? Your health care team will wash or sanitize their hands. ? Hair may be removed from the surgical area. ? Your skin will be washed with soap.  An IV will be inserted into one of your veins.  You will be given one or both of the following: ? A medicine to help you relax (sedative). ? A medicine to be sprayed or gargled in order to numb the back of your throat (local anesthetic).  Your blood pressure, heart rate, and breathing (vital signs) will be monitored during the procedure.  You may be asked to lay on your left side.  A bite block will be placed in your mouth to keep you from biting the tube during the procedure.  The tip of the TEE probe will be placed into the back of your mouth. You will be asked to swallow. This helps to pass the tip of the probe into the esophagus.  Once the tip of the probe is in the correct area, your health care provider will take pictures of the heart.  The probe and bite block will be removed when the procedure is done. The procedure may vary among health care providers and hospitals What happens after the procedure?  Your blood pressure, heart rate, breathing rate, and blood oxygen level will be monitored until the medicines you were given have worn off.  When you first wake up, your throat may feel slightly sore and will probably still feel numb. This will improve slowly over time. You will not be allowed to eat or  drink until the numbness has gone away.  Do not drive for 24 hours if you received a sedative. Summary  Transesophageal echocardiogram (TEE) is a test that uses sound waves (echocardiogram) to produce very clear, detailed images of the heart.  TEE is done by passing a flexible tube down the esophagus.  Generally, this is a safe procedure. However, problems may occur, including damage to other structures or organs, bleeding, irregular heart beat, and a hoarse voice or trouble swallowing.  Tell your health care provider about any medicines and medical conditions you may have, as some conditions may increase your risk of complications. This information is not intended to replace advice given to you by your health care provider. Make sure you discuss any questions you have with your health care provider. Document  Released: 03/05/2003 Document Revised: 03/11/2017 Document Reviewed: 03/11/2017 Elsevier Interactive Patient Education  2019 [employer].

## 2019-03-05 NOTE — CV Procedure (Addendum)
Brief TEE Note  LVEF >65% Severe posterior leaflet mitral valve prolapse Holosystolic moderate to severe mitral regurgitation Regurgitant jet is very eccentric and severity is likely underestimated by PISA Pulmonary vein systolic flow reversal noted Mild PR No LA/LAA thrombus or masses noted  For additional details see full report  Bach Rocchi C. Duke Salvia, MD, [facility] 03/05/2019 8:27 AM

## 2019-03-06 ENCOUNTER — Telehealth: Payer: Self-pay | Admitting: Interventional Cardiology

## 2019-03-06 DIAGNOSIS — I341 Nonrheumatic mitral (valve) prolapse: Secondary | ICD-10-CM

## 2019-03-06 NOTE — Telephone Encounter (Signed)
Calvin Records, MD at 03/05/2019 9:37 AM   Status: Signed    Let the patient know the mitral valve is leaking severely.  He needs a left and right heart cath with coronary angiogram to assess hemodynamics.  May need to be referred for mitral valve repair after the study if it confirms that the valve is severe.       Spoke with pt's wife and made her aware of recommendations.  Gave dates that Dr. [name] is in the cath lab.  Advised can bring in for appt if pt would like to speak to Dr. [name].  Wife will speak to pt once he gets up and call me tomorrow with information.  Wife appreciative for call.

## 2019-03-07 ENCOUNTER — Encounter: Payer: Self-pay | Admitting: *Deleted

## 2019-03-07 ENCOUNTER — Encounter (HOSPITAL_COMMUNITY): Payer: Self-pay | Admitting: Cardiovascular Disease

## 2019-03-07 NOTE — Anesthesia Postprocedure Evaluation (Signed)
Anesthesia Post Note  Patient: Calvin [name]  Procedure(s) Performed: TRANSESOPHAGEAL ECHOCARDIOGRAM (TEE) (N/A )     Patient location during evaluation: PACU Anesthesia Type: General Level of consciousness: awake Pain management: pain level controlled Vital Signs Assessment: post-procedure vital signs reviewed and stable Cardiovascular status: stable Postop Assessment: no apparent nausea or vomiting Anesthetic complications: no    Last Vitals:  Vitals:   03/05/19 0900 03/05/19 0905  BP: 131/80 122/85  Pulse: 78 71  Resp: 11 11  Temp:    SpO2: 99% 97%    Last Pain:  Vitals:   03/06/19 1535  TempSrc:   PainSc: 0-No pain                 Gearldine Looney

## 2019-03-07 NOTE — Telephone Encounter (Signed)
Called and scheduled pt for cath on 03/14/2019.  Spoke with wife prior to calling and advised I would call to schedule and call back with instructions.  Wife will have pt come by Friday for labs.

## 2019-03-07 NOTE — Telephone Encounter (Signed)
° ° °  Patient would like schedule appt cath lab for 3/18 if possible. Please call

## 2019-03-08 ENCOUNTER — Other Ambulatory Visit: Payer: Self-pay | Admitting: Interventional Cardiology

## 2019-03-08 ENCOUNTER — Other Ambulatory Visit: Payer: Self-pay

## 2019-03-08 ENCOUNTER — Other Ambulatory Visit: Payer: BLUE CROSS/BLUE SHIELD

## 2019-03-08 ENCOUNTER — Encounter: Payer: Self-pay | Admitting: *Deleted

## 2019-03-08 DIAGNOSIS — I341 Nonrheumatic mitral (valve) prolapse: Secondary | ICD-10-CM

## 2019-03-08 LAB — BASIC METABOLIC PANEL
BUN/Creatinine Ratio: 12 (ref 9–20)
BUN: 13 mg/dL (ref 6–24)
CO2: 19 mmol/L — ABNORMAL LOW (ref 20–29)
Calcium: 9.6 mg/dL (ref 8.7–10.2)
Chloride: 103 mmol/L (ref 96–106)
Creatinine, Ser: 1.1 mg/dL (ref 0.76–1.27)
GFR calc Af Amer: 88 mL/min/{1.73_m2} (ref >59)
GFR calc non Af Amer: 76 mL/min/{1.73_m2} (ref >59)
GLUCOSE: 93 mg/dL (ref 65–99)
Potassium: 4.3 mmol/L (ref 3.5–5.2)
Sodium: 140 mmol/L (ref 134–144)

## 2019-03-08 LAB — CBC
Hematocrit: 40.4 % (ref 37.5–51.0)
Hemoglobin: 13.9 g/dL (ref 13.0–17.7)
MCH: 30.1 pg (ref 26.6–33.0)
MCHC: 34.4 g/dL (ref 31.5–35.7)
MCV: 87 fL (ref 79–97)
PLATELETS: 390 10*3/uL (ref 150–450)
RBC: 4.62 x10E6/uL (ref 4.14–5.80)
RDW: 13.1 % (ref 11.6–15.4)
WBC: 9.5 10*3/uL (ref 3.4–10.8)

## 2019-03-09 ENCOUNTER — Other Ambulatory Visit: Payer: BLUE CROSS/BLUE SHIELD

## 2019-03-13 ENCOUNTER — Telehealth: Payer: Self-pay | Admitting: Interventional Cardiology

## 2019-03-13 ENCOUNTER — Telehealth: Payer: Self-pay | Admitting: *Deleted

## 2019-03-13 NOTE — Telephone Encounter (Signed)
Pt called and said Insurance denied the procedure he is to have tomorrow, and Insurance needs more information before approving the procedure. Pt does not want to cancel, but also does not want  To pay for the procedure out of pocket.   [employer]): [phone]

## 2019-03-13 NOTE — Telephone Encounter (Signed)
Pre cert team contacted insurance.  Pt now approved.

## 2019-03-13 NOTE — Telephone Encounter (Signed)
Patient states she is returning nurses call from yesterday in regards to her husband surgery that is scheduled for tomorrow.

## 2019-03-13 NOTE — Telephone Encounter (Signed)
Pt contacted pre-catheterization scheduled at [facility] for: Wednesday March 14, 2019 10 AM Verified arrival time and place: [facility] Main Entrance A at: 8 AM  No solid food after midnight prior to cath, clear liquids until 5 AM day of procedure. Contrast allergy: no  AM meds can be  taken pre-cath with sip of water including: ASA 81 mg  Confirm patient has responsible person to drive home post procedure and observe 24 hours after arriving home.  Left detailed voice mail with instructions (DPR), my name and phone number to call if questions.

## 2019-03-13 NOTE — Telephone Encounter (Signed)
Spoke with wife and made her aware that I was in contact with our pre cert team and they were working on approval.    Received a message from Delrae Alfred in pre cert that pt has been approved.  Spoke with pt and made him aware.  Pt appreciative for call.

## 2019-03-14 ENCOUNTER — Other Ambulatory Visit: Payer: Self-pay

## 2019-03-14 ENCOUNTER — Encounter (HOSPITAL_COMMUNITY)
Admission: RE | Disposition: A | Discharge status: Home / Self Care | Payer: Self-pay | Source: Home / Self Care | Attending: Interventional Cardiology

## 2019-03-14 ENCOUNTER — Ambulatory Visit (HOSPITAL_COMMUNITY)
Admission: RE | Admit: 2019-03-14 | Discharge: 2019-03-14 | Disposition: A | Discharge status: Home / Self Care | Payer: BLUE CROSS/BLUE SHIELD | Attending: Interventional Cardiology | Admitting: Interventional Cardiology

## 2019-03-14 ENCOUNTER — Encounter (HOSPITAL_COMMUNITY): Payer: Self-pay | Admitting: Interventional Cardiology

## 2019-03-14 DIAGNOSIS — Z823 Family history of stroke: Secondary | ICD-10-CM | POA: Diagnosis not present

## 2019-03-14 DIAGNOSIS — I25119 Atherosclerotic heart disease of native coronary artery with unspecified angina pectoris: Secondary | ICD-10-CM | POA: Diagnosis not present

## 2019-03-14 DIAGNOSIS — Z888 Allergy status to other drugs, medicaments and biological substances status: Secondary | ICD-10-CM | POA: Insufficient documentation

## 2019-03-14 DIAGNOSIS — I252 Old myocardial infarction: Secondary | ICD-10-CM | POA: Insufficient documentation

## 2019-03-14 DIAGNOSIS — I2510 Atherosclerotic heart disease of native coronary artery without angina pectoris: Secondary | ICD-10-CM | POA: Diagnosis not present

## 2019-03-14 DIAGNOSIS — R55 Syncope and collapse: Secondary | ICD-10-CM | POA: Insufficient documentation

## 2019-03-14 DIAGNOSIS — E785 Hyperlipidemia, unspecified: Secondary | ICD-10-CM | POA: Diagnosis not present

## 2019-03-14 DIAGNOSIS — R0609 Other forms of dyspnea: Secondary | ICD-10-CM

## 2019-03-14 DIAGNOSIS — Z955 Presence of coronary angioplasty implant and graft: Secondary | ICD-10-CM | POA: Diagnosis not present

## 2019-03-14 DIAGNOSIS — I341 Nonrheumatic mitral (valve) prolapse: Secondary | ICD-10-CM

## 2019-03-14 DIAGNOSIS — Z7982 Long term (current) use of aspirin: Secondary | ICD-10-CM | POA: Diagnosis not present

## 2019-03-14 DIAGNOSIS — Z825 Family history of asthma and other chronic lower respiratory diseases: Secondary | ICD-10-CM | POA: Diagnosis not present

## 2019-03-14 DIAGNOSIS — Z79899 Other long term (current) drug therapy: Secondary | ICD-10-CM | POA: Diagnosis not present

## 2019-03-14 DIAGNOSIS — Z8249 Family history of ischemic heart disease and other diseases of the circulatory system: Secondary | ICD-10-CM | POA: Insufficient documentation

## 2019-03-14 DIAGNOSIS — J45909 Unspecified asthma, uncomplicated: Secondary | ICD-10-CM | POA: Insufficient documentation

## 2019-03-14 DIAGNOSIS — I051 Rheumatic mitral insufficiency: Secondary | ICD-10-CM | POA: Insufficient documentation

## 2019-03-14 HISTORY — PX: RIGHT/LEFT HEART CATH AND CORONARY ANGIOGRAPHY: CATH118266

## 2019-03-14 LAB — POCT I-STAT EG7
Acid-base deficit: 3 mmol/L — ABNORMAL HIGH (ref 0.0–2.0)
Bicarbonate: 22.1 mmol/L (ref 20.0–28.0)
Calcium, Ion: 1.24 mmol/L (ref 1.15–1.40)
HCT: 38 % — ABNORMAL LOW (ref 39.0–52.0)
HEMOGLOBIN: 12.9 g/dL — AB (ref 13.0–17.0)
O2 Saturation: 73 %
Potassium: 3.7 mmol/L (ref 3.5–5.1)
Sodium: 143 mmol/L (ref 135–145)
TCO2: 23 mmol/L (ref 22–32)
pCO2, Ven: 41 mmHg — ABNORMAL LOW (ref 44.0–60.0)
pH, Ven: 7.341 (ref 7.250–7.430)
pO2, Ven: 41 mmHg (ref 32.0–45.0)

## 2019-03-14 LAB — POCT I-STAT 7, (LYTES, BLD GAS, ICA,H+H)
ACID-BASE DEFICIT: 4 mmol/L — AB (ref 0.0–2.0)
BICARBONATE: 21 mmol/L (ref 20.0–28.0)
Calcium, Ion: 1.22 mmol/L (ref 1.15–1.40)
HCT: 38 % — ABNORMAL LOW (ref 39.0–52.0)
Hemoglobin: 12.9 g/dL — ABNORMAL LOW (ref 13.0–17.0)
O2 Saturation: 98 %
PH ART: 7.378 (ref 7.350–7.450)
PO2 ART: 102 mmHg (ref 83.0–108.0)
Potassium: 3.7 mmol/L (ref 3.5–5.1)
Sodium: 142 mmol/L (ref 135–145)
TCO2: 22 mmol/L (ref 22–32)
pCO2 arterial: 35.8 mmHg (ref 32.0–48.0)

## 2019-03-14 SURGERY — RIGHT/LEFT HEART CATH AND CORONARY ANGIOGRAPHY
Anesthesia: LOCAL

## 2019-03-14 MED ORDER — IOHEXOL 350 MG/ML SOLN
INTRAVENOUS | Status: DC | PRN
  Administered 2019-03-14: 120 mL via INTRA_ARTERIAL

## 2019-03-14 MED ORDER — VERAPAMIL HCL 2.5 MG/ML IV SOLN
INTRAVENOUS | Status: DC | PRN
  Administered 2019-03-14: 10 mL via INTRA_ARTERIAL

## 2019-03-14 MED ORDER — ASPIRIN 81 MG PO CHEW: 81.0000 mg | CHEWABLE_TABLET | ORAL | Status: DC

## 2019-03-14 MED ORDER — MIDAZOLAM HCL 2 MG/2ML IJ SOLN
INTRAMUSCULAR | Status: DC | PRN
  Administered 2019-03-14: 1 mg via INTRAVENOUS
  Administered 2019-03-14: 0.5 mg via INTRAVENOUS

## 2019-03-14 MED ORDER — SODIUM CHLORIDE 0.9 % WEIGHT BASED INFUSION: 1.0000 mL/kg/h | INTRAVENOUS | Status: DC

## 2019-03-14 MED ORDER — FENTANYL CITRATE (PF) 100 MCG/2ML IJ SOLN
INTRAMUSCULAR | Status: AC
  Filled 2019-03-14: qty 2

## 2019-03-14 MED ORDER — MIDAZOLAM HCL 2 MG/2ML IJ SOLN
INTRAMUSCULAR | Status: AC
  Filled 2019-03-14: qty 2

## 2019-03-14 MED ORDER — SODIUM CHLORIDE 0.9 % WEIGHT BASED INFUSION
3.0000 mL/kg/h | INTRAVENOUS | Status: AC
  Administered 2019-03-14: 3 mL/kg/h via INTRAVENOUS

## 2019-03-14 MED ORDER — LIDOCAINE HCL (PF) 1 % IJ SOLN
INTRAMUSCULAR | Status: AC
  Filled 2019-03-14: qty 30

## 2019-03-14 MED ORDER — SODIUM CHLORIDE 0.9 % IV SOLN: 250.0000 mL | INTRAVENOUS | Status: DC | PRN

## 2019-03-14 MED ORDER — SODIUM CHLORIDE 0.9% FLUSH: 3.0000 mL | INTRAVENOUS | Status: DC | PRN

## 2019-03-14 MED ORDER — HEPARIN (PORCINE) IN NACL 1000-0.9 UT/500ML-% IV SOLN
INTRAVENOUS | Status: AC
  Filled 2019-03-14: qty 1000

## 2019-03-14 MED ORDER — HEPARIN SODIUM (PORCINE) 1000 UNIT/ML IJ SOLN
INTRAMUSCULAR | Status: AC
  Filled 2019-03-14: qty 1

## 2019-03-14 MED ORDER — VERAPAMIL HCL 2.5 MG/ML IV SOLN
INTRAVENOUS | Status: AC
  Filled 2019-03-14: qty 2

## 2019-03-14 MED ORDER — SODIUM CHLORIDE 0.9% FLUSH: 3.0000 mL | Freq: Two times a day (BID) | INTRAVENOUS | Status: DC

## 2019-03-14 MED ORDER — LIDOCAINE HCL (PF) 1 % IJ SOLN
INTRAMUSCULAR | Status: DC | PRN
  Administered 2019-03-14 (×2): 2 mL

## 2019-03-14 MED ORDER — FENTANYL CITRATE (PF) 100 MCG/2ML IJ SOLN
INTRAMUSCULAR | Status: DC | PRN
  Administered 2019-03-14 (×2): 25 ug via INTRAVENOUS

## 2019-03-14 MED ORDER — HEPARIN (PORCINE) IN NACL 1000-0.9 UT/500ML-% IV SOLN
INTRAVENOUS | Status: DC | PRN
  Administered 2019-03-14 (×2): 500 mL

## 2019-03-14 MED ORDER — HEPARIN SODIUM (PORCINE) 1000 UNIT/ML IJ SOLN
INTRAMUSCULAR | Status: DC | PRN
  Administered 2019-03-14: 5000 [IU] via INTRAVENOUS

## 2019-03-14 SURGICAL SUPPLY — 13 items
CATH 5FR JL3.5 JR4 ANG PIG MP (CATHETERS) ×2 IMPLANT
CATH BALLN WEDGE 5F 110CM (CATHETERS) ×2 IMPLANT
DEVICE RAD COMP TR BAND LRG (VASCULAR PRODUCTS) ×2 IMPLANT
GLIDESHEATH SLEND A-KIT 6F 22G (SHEATH) ×2 IMPLANT
GUIDEWIRE INQWIRE 1.5J.035X260 (WIRE) ×1 IMPLANT
INQWIRE 1.5J .035X260CM (WIRE) ×2
KIT HEART LEFT (KITS) ×2 IMPLANT
PACK CARDIAC CATHETERIZATION (CUSTOM PROCEDURE TRAY) ×2 IMPLANT
SHEATH GLIDE SLENDER 4/5FR (SHEATH) ×2 IMPLANT
SHEATH PROBE COVER 6X72 (BAG) ×2 IMPLANT
SYR MEDRAD MARK 7 150ML (SYRINGE) ×2 IMPLANT
TRANSDUCER W/STOPCOCK (MISCELLANEOUS) ×2 IMPLANT
TUBING CIL FLEX 10 FLL-RA (TUBING) ×2 IMPLANT

## 2019-03-14 NOTE — H&P (Signed)
'@LOGODEPT' @ Cardiology Office Note:    Date:  03/14/2019   ID:  Calvin [name], DOB 02-07-1966, MRN [id number]  PCP:  Maryland Pink, MD  Cardiologist:  Sinclair Grooms, MD   Referring MD: No ref. provider found    History of Present Illness:    Calvin [name] is a 53 y.o. male with a hx of CAD,DES to first diagonal and DES to LAD March 2015. Residual 50-60% RCA stenosis. Dual antiplatelet therapy stopped one year ago. Hyperlipidemia is only other medical problem. Also has mitral valve prolapse with clinical murmur.  On last office visit in February, he complained of exertional dyspnea.  Clinical exam confirmed a mitral regurgitation murmur.  Echocardiography was performed and demonstrated at least moderate mitral regurgitation in the past.  Because of the symptom of angina, further evaluation was performed with transesophageal echo.  Was completely assessed, the mitral regurgitation was felt to be significant with reversal of pulmonary vein flow.  Because of the patient's complaints of dyspnea and now documented severe mitral valve prolapse and regurgitation, I have recommended that he undergo coronary angiography with right and left heart cath to assess status prior to referral for consideration of early mitral valve repair.  After speaking with the patient, he is having limitations related to dyspnea.  We will plan early referral after hemodynamics are defined.      Past Medical History:  Diagnosis Date  . ACS (acute coronary syndrome) with NSTEMI 03/25/2014  . Acute sinus infection 03/27/2014  . Asthma   . Asthmatic bronchitis   . CAD (coronary artery disease), native coronary artery, DES placed to 1st diag. 03/26/14; residual disease to LAD with FFR to 0.62 and RCA of 50-60%- plan for PCI to LAD 03/27/2014  . Dyslipidemia, goal LDL below 70 03/27/2014  . GI bleed 03/27/2014  . Hyperlipemia   . MVP (mitral valve prolapse)    Dr Ubaldo Glassing  . Non Q wave myocardial infarction ([place]) 03/23/2014   . Psoriasis   . Rhinitis          Past Surgical History:  Procedure Laterality Date  . CORONARY ANGIOPLASTY WITH STENT PLACEMENT  03/25/2014; 04/16/2014   "1 + 1"  . FLEXIBLE SIGMOIDOSCOPY Left 03/28/2014   Procedure: FLEXIBLE SIGMOIDOSCOPY;  Surgeon: Beryle Beams, MD;  Location: [place];  Service: Endoscopy;  Laterality: Left;  . LEFT HEART CATHETERIZATION WITH CORONARY ANGIOGRAM N/A 03/26/2014   Procedure: LEFT HEART CATHETERIZATION WITH CORONARY ANGIOGRAM;  Surgeon: Sinclair Grooms, MD;  Location: [facility] CATH LAB;  Service: Cardiovascular;  Laterality: N/A;  . NO PAST SURGERIES    . PERCUTANEOUS CORONARY STENT INTERVENTION (PCI-S) N/A 04/16/2014   Procedure: PERCUTANEOUS CORONARY STENT INTERVENTION (PCI-S);  Surgeon: Sinclair Grooms, MD;  Location: [facility] CATH LAB;  Service: Cardiovascular;  Laterality: N/A;    Current Medications: ActiveMedications      Current Meds  Medication Sig  . albuterol (PROAIR HFA) 108 (90 BASE) MCG/ACT inhaler Inhale 2 puffs into the lungs every 6 (six) hours as needed for wheezing or shortness of breath.  Marland Kitchen amLODipine (NORVASC) 5 MG tablet Take 1 tablet (5 mg total) by mouth daily. Please call and schedule an appointment for further refills 1st attempt  . aspirin 81 MG tablet Take 1 tablet (81 mg total) by mouth daily.  Marland Kitchen azelastine (ASTELIN) 0.1 % nasal spray Place 2 sprays into both nostrils 2 (two) times daily.   . fenofibrate 160 MG tablet Take 160 mg by mouth daily.   Marland Kitchen  fluticasone (FLONASE) 50 MCG/ACT nasal spray Place 1 spray into both nostrils daily as needed for allergies.  . Hypertonic Nasal Wash (SINUS RINSE KIT) PACK Place 50 mLs into the nose daily.   . mometasone-formoterol (DULERA) 100-5 MCG/ACT AERO Inhale 1 puff into the lungs 2 (two) times daily.  . nitroGLYCERIN (NITROSTAT) 0.4 MG SL tablet Place 1 tablet (0.4 mg total) under the tongue every 5 (five) minutes as needed for chest pain. Up to 3 doses maximum  . Salicylic  Acid (COMPOUND W EX) Apply 1 application topically daily as needed (psoriasis). Fluticasone cream 0.05% and Cetaphil Compound Cream       Allergies:   Daliresp [roflumilast]   Social History        Socioeconomic History  . Marital status: Married    Spouse name: Not on file  . Number of children: 0  . Years of education: Not on file  . Highest education level: Not on file  Occupational History  . Occupation: Hotel manager  Social Needs  . Financial resource strain: Not on file  . Food insecurity:    Worry: Not on file    Inability: Not on file  . Transportation needs:    Medical: Not on file    Non-medical: Not on file  Tobacco Use  . Smoking status: Never Smoker  . Smokeless tobacco: Never Used  Substance and Sexual Activity  . Alcohol use: No  . Drug use: No  . Sexual activity: Yes  Lifestyle  . Physical activity:    Days per week: Not on file    Minutes per session: Not on file  . Stress: Not on file  Relationships  . Social connections:    Talks on phone: Not on file    Gets together: Not on file    Attends religious service: Not on file    Active member of club or organization: Not on file    Attends meetings of clubs or organizations: Not on file    Relationship status: Not on file  Other Topics Concern  . Not on file  Social History Narrative  . Not on file     Family History: The patient's family history includes Asthma in his maternal grandmother; Emphysema in his maternal grandmother; Heart disease in his maternal uncle and mother; Hypertension in his maternal grandmother; Prostate cancer in his paternal grandfather; Stroke in his maternal uncle. There is no history of Heart attack.  ROS:   Please see the history of present illness.    Cough, wheezing, increasing weight, decrease in physical activity relative to prior.  All other systems reviewed and are negative.  EKGs/Labs/Other Studies Reviewed:    The following  studies were reviewed today: 2D. Doppler echocardiogram May 2018: Study Conclusions  - Left ventricle: The cavity size was mildly dilated. Systolic function was normal. The estimated ejection fraction was in the range of 55% to 60%. Wall motion was normal; there were no regional wall motion abnormalities. Left ventricular diastolic function parameters were normal. - Mitral valve: Diffuse thickening of the posterior leaflet, consistent with myxomatous proliferation. Severe, holosystolicprolapse, involving the posterior leaflet. There was moderate regurgitation. - Left atrium: The atrium was mildly dilated. - Pulmonic valve: There was trivial regurgitation. - Pulmonary arteries: Systolic pressure could not be accurately estimated.  Impressions:  - Upper normal to mildly dilated LV dimensions with normal LVF EF 55-60%, mild LAE, trivial PR and severe holosystolic prolapse of the posterior MV leaflet. There is at least moderate MR by  colorflow Doppler but ERO not obtained. Could consider TEE for further evaluation of MR.  Transesophageal echo 03/05/2019:  IMPRESSIONS    1. The left ventricle has normal systolic function, with an ejection fraction of 60-65%. The cavity size was normal. Left ventricular diastolic function could not be evaluated.  2. The right ventricle has normal systolc function. The cavity was normal. There is no increase in right ventricular wall thickness.  3. Left atrial size was mildly dilated.  4. Mitral valve regurgitation is severe by color flow Doppler. The MR jet is eccentric medially directed.  5. There is severe mitral valve regurgitation with a regurgitant fraction of 54.60 %.  6. Severe prolapse of the P2 segment of the mitral valve.     PISA radius 0.97 cm. ERO 0.33 cm^2. Regurgitant volume 69.6 ml.  7. No stenosis of the aortic valve.  8. The ascending aorta and descending aorta are normal in size and  structure.    EKG:  EKG sinus rhythm, inferior nonspecific T wave abnormality.  Left ventricular hypertrophy.  Recent Labs: No results found for requested labs within last 8760 hours.  Recent Lipid Panel Labs(Brief)          Component Value Date/Time   CHOL 134 04/29/2017 0904   TRIG 66 04/29/2017 0904   HDL 49 04/29/2017 0904   CHOLHDL 2.7 04/29/2017 0904   CHOLHDL 2 09/24/2015 0750   VLDL 14.4 09/24/2015 0750   LDLCALC 72 04/29/2017 0904      Physical Exam:    VS:  BP (!) 146/86   Pulse 75   Ht '6\' 1"'  (1.854 m)   Wt 226 lb 12.8 oz (102.9 kg)   SpO2 95%   BMI 29.92 kg/m        Wt Readings from Last 3 Encounters:  02/08/19 226 lb 12.8 oz (102.9 kg)  01/23/18 208 lb 6.4 oz (94.5 kg)  04/29/17 214 lb (97.1 kg)     GEN: Age is compatible with appearance. No acute distress HEENT: Normal NECK: No JVD. LYMPHATICS: No lymphadenopathy CARDIAC: RRR.  2-3/6 left mid sternal border: Systolic murmur, positive S4 gallop, no edema VASCULAR: 2+ bilateral radial and carotid pulses, no bruits RESPIRATORY:  Clear to auscultation without rales, wheezing or rhonchi  ABDOMEN: Soft, non-tender, non-distended, No pulsatile mass, MUSCULOSKELETAL: No deformity  SKIN: Warm and dry NEUROLOGIC:  Alert and oriented x 3 PSYCHIATRIC:  Normal affect   ASSESSMENT:    1. CAD in native artery   2. Dyslipidemia, goal LDL below 70   3. Billowing mitral valve   4. Vasovagal syncope   5. Wheezing     RECOMMENDATIONS:   Severe mitral regurgitation has been identified by transesophageal echo.  Coronary angiography with right and left heart catheterization will be performed to identify substrate including hemodynamic severity of mitral regurgitation prior to referral for consideration of early mitral valve repair.      Signed, Sinclair Grooms, MD  03/14/2019 9:48 AM    [place]

## 2019-03-14 NOTE — Discharge Instructions (Signed)
Radial Site Care ° °This sheet gives you information about how to care for yourself after your procedure. Your health care provider may also give you more specific instructions. If you have problems or questions, contact your health care provider. °What can I expect after the procedure? °After the procedure, it is common to have: °· Bruising and tenderness at the catheter insertion area. °Follow these instructions at home: °Medicines °· Take over-the-counter and prescription medicines only as told by your health care provider. °Insertion site care °· Follow instructions from your health care provider about how to take care of your insertion site. Make sure you: °? Wash your hands with soap and water before you change your bandage (dressing). If soap and water are not available, use hand sanitizer. °? Change your dressing as told by your health care provider. °? Leave stitches (sutures), skin glue, or adhesive strips in place. These skin closures may need to stay in place for 2 weeks or longer. If adhesive strip edges start to loosen and curl up, you may trim the loose edges. Do not remove adhesive strips completely unless your health care provider tells you to do that. °· Check your insertion site every day for signs of infection. Check for: °? Redness, swelling, or pain. °? Fluid or blood. °? Pus or a bad smell. °? Warmth. °· Do not take baths, swim, or use a hot tub until your health care provider approves. °· You may shower 24-48 hours after the procedure, or as directed by your health care provider. °? Remove the dressing and gently wash the site with plain soap and water. °? Pat the area dry with a clean towel. °? Do not rub the site. That could cause bleeding. °· Do not apply powder or lotion to the site. °Activity ° °· For 24 hours after the procedure, or as directed by your health care provider: °? Do not flex or bend the affected arm. °? Do not push or pull heavy objects with the affected arm. °? Do not  drive yourself home from the hospital or clinic. You may drive 24 hours after the procedure unless your health care provider tells you not to. °? Do not operate machinery or power tools. °· Do not lift anything that is heavier than 10 lb (4.5 kg), or the limit that you are told, until your health care provider says that it is safe. °· Ask your health care provider when it is okay to: °? Return to work or school. °? Resume usual physical activities or sports. °? Resume sexual activity. °General instructions °· If the catheter site starts to bleed, raise your arm and put firm pressure on the site. If the bleeding does not stop, get help right away. This is a medical emergency. °· If you went home on the same day as your procedure, a responsible adult should be with you for the first 24 hours after you arrive home. °· Keep all follow-up visits as told by your health care provider. This is important. °Contact a health care provider if: °· You have a fever. °· You have redness, swelling, or yellow drainage around your insertion site. °Get help right away if: °· You have unusual pain at the radial site. °· The catheter insertion area swells very fast. °· The insertion area is bleeding, and the bleeding does not stop when you hold steady pressure on the area. °· Your arm or hand becomes pale, cool, tingly, or numb. °These symptoms may represent a serious problem   that is an emergency. Do not wait to see if the symptoms will go away. Get medical help right away. Call your local emergency services (911 in the [place] Do not drive yourself to the hospital. °Summary °· After the procedure, it is common to have bruising and tenderness at the site. °· Follow instructions from your health care provider about how to take care of your radial site wound. Check the wound every day for signs of infection. °· Do not lift anything that is heavier than 10 lb (4.5 kg), or the limit that you are told, until your health care provider says  that it is safe. °This information is not intended to replace advice given to you by your health care provider. Make sure you discuss any questions you have with your health care provider. °Document Released: 01/15/2011 Document Revised: 01/18/2018 Document Reviewed: 01/18/2018 °[employer] Interactive Patient Education © 2019 [employer] ° °

## 2019-03-27 ENCOUNTER — Telehealth: Payer: Self-pay | Admitting: Interventional Cardiology

## 2019-03-27 NOTE — Telephone Encounter (Signed)
Called patient to inform him that form has been signed. Pt will pick up at his next appt. 03/27/19 vlm

## 2019-03-28 ENCOUNTER — Other Ambulatory Visit: Payer: BLUE CROSS/BLUE SHIELD

## 2019-04-04 ENCOUNTER — Telehealth: Payer: Self-pay | Admitting: Interventional Cardiology

## 2019-04-04 NOTE — Telephone Encounter (Signed)
New Message    Patient needs a return to work note.

## 2019-04-04 NOTE — Telephone Encounter (Signed)
Pts wife, Arline Asp, on [place] advised Dr. [name] has noted that the pt can return to work.. I advised her that I can send a letter through My Chart and she was very appreciative.

## 2019-04-16 ENCOUNTER — Encounter: Payer: BLUE CROSS/BLUE SHIELD | Admitting: Thoracic Surgery (Cardiothoracic Vascular Surgery)

## 2019-04-17 ENCOUNTER — Encounter: Payer: Self-pay | Admitting: Thoracic Surgery (Cardiothoracic Vascular Surgery)

## 2019-04-17 ENCOUNTER — Telehealth: Payer: Self-pay | Admitting: Thoracic Surgery (Cardiothoracic Vascular Surgery)

## 2019-04-17 NOTE — Telephone Encounter (Signed)
Called to check on patient because his elective appointment with me for possible surgical management of severe symptomatic primary mitral regurgitation has been delayed because of the COVID pandemic.  I left a message on the patient's answering machine.  We will plan to see him in approximately 1 month if COVID-19 restrictions have been relaxed to some degree.  I encouraged the patient to call between now and then if he is having any clinical problems.  Purcell Nails, MD 04/17/2019 3:25 PM

## 2019-05-14 ENCOUNTER — Encounter: Payer: Self-pay | Admitting: Thoracic Surgery (Cardiothoracic Vascular Surgery)

## 2019-05-14 ENCOUNTER — Other Ambulatory Visit: Payer: Self-pay

## 2019-05-14 ENCOUNTER — Institutional Professional Consult (permissible substitution): Payer: BLUE CROSS/BLUE SHIELD | Admitting: Thoracic Surgery (Cardiothoracic Vascular Surgery)

## 2019-05-14 ENCOUNTER — Other Ambulatory Visit: Payer: Self-pay | Admitting: *Deleted

## 2019-05-14 VITALS — BP 156/97 | HR 88 | Temp 98.1°F | Resp 20 | Ht 73.0 in | Wt 224.0 lb

## 2019-05-14 DIAGNOSIS — I340 Nonrheumatic mitral (valve) insufficiency: Secondary | ICD-10-CM | POA: Diagnosis not present

## 2019-05-14 DIAGNOSIS — Z01818 Encounter for other preprocedural examination: Secondary | ICD-10-CM

## 2019-05-14 NOTE — Progress Notes (Signed)
[address]       [name] [address]             [phone]     CARDIOTHORACIC SURGERY CONSULTATION REPORT  Referring Provider is [name], MD PCP is [name], MD  Chief Complaint  Patient presents with   Mitral Regurgitation    severe MR, eval for Mini MVR    HPI:  Patient has a 53 year old male with history of mitral valve prolapse and mitral regurgitation, coronary artery disease status post acute non-ST segment elevation myocardial infarction in 2015 treated with PCI and stenting of the diagonal branch, chronic sinusitis, and hyperlipidemia who has been referred for surgical consultation to discuss treatment options for management of severe symptomatic primary mitral regurgitation.  Patient states that he was first noted to have a heart murmur on physical exam by his primary care physician more than 5 years ago.  He has been followed regularly ever since.  In 2015 he presented with chest discomfort and a near syncopal episode and ruled in for non-ST segment elevation myocardial infarction.  He was found to have high-grade stenosis of the diagonal branch of left anterior descending coronary artery and underwent PCI and stenting.  He was noted to have 50 to 60% stenosis of the right coronary artery and otherwise mild nonobstructive disease.  He has been followed regularly ever since by Dr. [name].  He states that he has done well clinically until this past December when he noted a relatively sudden change in his exercise tolerance with the development of new onset exertional shortness of breath.  Routine follow-up echocardiogram performed February 13, 2019 revealed normal left ventricular function.  There was mitral valve prolapse with what was felt to be moderate mitral regurgitation.  Because of worsening symptoms the patient underwent TEE on March 05, 2019.  This revealed mitral valve prolapse with severe mitral regurgitation.  The jet of  regurgitation was quite eccentric but the PISA radius measures 0.97 cm corresponding to ER O of 0.33 cm and regurgitant volume calculated 69.6 mL.  Patient subsequently underwent diagnostic cardiac catheterization by Dr. [name].  The stent in the diagonal branch remained widely patent although there was 70-80 ostial stenosis of the diagonal branch.  There remained what was felt to be nonobstructive coronary artery disease in the remainder of the coronary arteries.  Right heart pressures were normal.  Large V waves were not seen on wedge tracing.  Patient lives locally in [place] with his wife.  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  He states that he had remained fairly active physically until this past December when he began to experience worsening shortness of breath.  He now gets short of breath with moderate level exertion such as walking up a flight of stairs.  He denies resting shortness of breath, PND, orthopnea, or lower extremity edema.  He has not had any chest pain either with activity or at rest.  He has not had any dizzy spells or syncope.  Past Medical History:  Diagnosis Date   ACS (acute coronary syndrome) with NSTEMI 03/25/2014   Acute sinus infection 03/27/2014   Asthma    Asthmatic bronchitis    CAD (coronary artery disease), native coronary artery, DES placed to 1st diag. 03/26/14; residual disease to LAD with FFR to 0.62 and RCA of 50-60%- plan for PCI to LAD 03/27/2014   Dyslipidemia, goal  LDL below 70 03/27/2014   GI bleed 03/27/2014   Hyperlipemia    MVP (mitral valve prolapse)    Dr [name]   Non Q wave myocardial infarction [facility]) 03/23/2014   Psoriasis    Rhinitis     Past Surgical History:  Procedure Laterality Date   CORONARY ANGIOPLASTY WITH STENT PLACEMENT  03/25/2014; 04/16/2014   "1 + 1"   FLEXIBLE SIGMOIDOSCOPY Left 03/28/2014   Procedure: FLEXIBLE SIGMOIDOSCOPY;  Surgeon: [name], MD;  Location: [facility] ENDOSCOPY;  Service: Endoscopy;  Laterality: Left;   LEFT HEART CATHETERIZATION WITH CORONARY ANGIOGRAM N/A 03/26/2014   Procedure: LEFT HEART CATHETERIZATION WITH CORONARY ANGIOGRAM;  Surgeon: [name], MD;  Location: [facility] CATH LAB;  Service: Cardiovascular;  Laterality: N/A;   NO PAST SURGERIES     PERCUTANEOUS CORONARY STENT INTERVENTION (PCI-S) N/A 04/16/2014   Procedure: PERCUTANEOUS CORONARY STENT INTERVENTION (PCI-S);  Surgeon: [name], MD;  Location: [facility] CATH LAB;  Service: Cardiovascular;  Laterality: N/A;   RIGHT/LEFT HEART CATH AND CORONARY ANGIOGRAPHY N/A 03/14/2019   Procedure: RIGHT/LEFT HEART CATH AND CORONARY ANGIOGRAPHY;  Surgeon: [name], MD;  Location: [facility] INVASIVE CV LAB;  Service: Cardiovascular;  Laterality: N/A;   TEE WITHOUT CARDIOVERSION N/A 03/05/2019   Procedure: TRANSESOPHAGEAL ECHOCARDIOGRAM (TEE);  Surgeon: [name], MD;  Location: [facility] ENDOSCOPY;  Service: Cardiovascular;  Laterality: N/A;    Family History  Problem Relation Age of Onset   Heart disease Mother        deceased age 15   Emphysema Maternal Grandmother        non smoker   Asthma Maternal Grandmother    Hypertension Maternal Grandmother    Prostate cancer Paternal Grandfather    Heart disease Maternal Uncle        valve replaced; deceased age 32   Stroke Maternal Uncle    Heart attack Neg Hx     Social History   Socioeconomic History   Marital status: Married    Spouse name: Not on file   Number of children: 0   Years of education: Not on file   Highest education level: Not on file  Occupational History   Occupation: Insurance underwriter  Social Needs   Financial resource strain: Not on file   Food insecurity:    Worry: Not on file    Inability: Not on file   Transportation needs:    Medical: Not on file    Non-medical: Not on file  Tobacco Use   Smoking status: Never Smoker   Smokeless tobacco: Never Used    Substance and Sexual Activity   Alcohol use: No   Drug use: No   Sexual activity: Yes  Lifestyle   Physical activity:    Days per week: Not on file    Minutes per session: Not on file   Stress: Not on file  Relationships   Social connections:    Talks on phone: Not on file    Gets together: Not on file    Attends religious service: Not on file    Active member of club or organization: Not on file    Attends meetings of clubs or organizations: Not on file    Relationship status: Not on file   Intimate partner violence:    Fear of current or ex partner: Not on file    Emotionally abused: Not on file    Physically abused: Not on file    Forced sexual activity: Not on file  Other Topics Concern   Not on file  Social History Narrative   Not on file    Current Outpatient Medications  Medication Sig Dispense Refill   albuterol (PROAIR HFA) 108 (90 BASE) MCG/ACT inhaler Inhale 2 puffs into the lungs every 6 (six) hours as needed for wheezing or shortness of breath. 1 Inhaler prn   amLODipine (NORVASC) 5 MG tablet Take 1 tablet (5 mg total) by mouth daily. Please call and schedule an appointment for further refills 1st attempt 90 tablet 0   aspirin 81 MG tablet Take 1 tablet (81 mg total) by mouth daily.     cetirizine (ZYRTEC) 10 MG tablet Take 10 mg by mouth daily.     fenofibrate 160 MG tablet Take 160 mg by mouth daily.      fluticasone (CUTIVATE) 0.05 % cream Apply 1 application topically 2 (two) times daily as needed (psoriasis).     fluticasone (FLONASE) 50 MCG/ACT nasal spray Place 1 spray into both nostrils daily.      mometasone-formoterol (DULERA) 100-5 MCG/ACT AERO Inhale 1 puff into the lungs 2 (two) times daily.     Multiple Vitamin (MULTIVITAMIN WITH MINERALS) TABS tablet Take 1 tablet by mouth daily.     nitroGLYCERIN (NITROSTAT) 0.4 MG SL tablet Place 1 tablet (0.4 mg total) under the tongue every 5 (five) minutes as needed for chest pain. Up to 3  doses maximum 25 tablet 3   rosuvastatin (CRESTOR) 5 MG tablet Take 1 tablet (5 mg total) by mouth daily. 90 tablet 3   No current facility-administered medications for this visit.     Allergies  Allergen Reactions   Brilinta [Ticagrelor]     GI bleed   Daliresp [Roflumilast] Other (See Comments)    GI UPSET      Review of Systems:   General:  normal appetite, decreased energy, no weight gain, no weight loss, no fever  Cardiac:  no chest pain with exertion, no chest pain at rest, + SOB with exertion, no resting SOB, no PND, no orthopnea, no palpitations, no arrhythmia, no atrial fibrillation, no LE edema, no dizzy spells, no syncope  Respiratory:  + exertional shortness of breath, no home oxygen, + occasional productive cough, + chronic dry cough, no bronchitis, + wheezing, no hemoptysis, no asthma, no pain with inspiration or cough, no sleep apnea, no CPAP at night  GI:   no difficulty swallowing, no reflux, no frequent heartburn, no hiatal hernia, no abdominal pain, no constipation, no diarrhea, no hematochezia, no hematemesis, no melena  GU:   no dysuria,  no frequency, no urinary tract infection, no hematuria, no enlarged prostate, no kidney stones, no kidney disease  Vascular:  no pain suggestive of claudication, no pain in feet, no leg cramps, no varicose veins, no DVT, no non-healing foot ulcer  Neuro:   no stroke, no TIA's, no seizures, no headaches, no temporary blindness one eye,  no slurred speech, no peripheral neuropathy, no chronic pain, no instability of gait, no memory/cognitive dysfunction  Musculoskeletal: no arthritis, no joint swelling, no myalgias, no difficulty walking, normal mobility   Skin:   no rash, no itching, no skin infections, no pressure sores or ulcerations  Psych:   no anxiety, no depression, no nervousness, no unusual recent stress  Eyes:   no blurry vision, no floaters, no recent vision changes, + wears glasses or contacts  ENT:   no hearing loss,  no loose or painful teeth, no dentures, last saw dentist > 5 years  ago  Hematologic:  no easy bruising, no abnormal bleeding, no clotting disorder, no frequent epistaxis  Endocrine:  no diabetes, does not check CBG's at home     Physical Exam:   BP (!) 156/97    Pulse 88    Temp 98.1 F (36.7 C)    Resp 20    Ht  (1.854 m)    Wt 224 lb (101.6 kg)    SpO2 95% Comment: on RA   BMI 29.55 kg/m   General:  Mildly obese,  well-appearing  HEENT:  Unremarkable   Neck:   no JVD, no bruits, no adenopathy   Chest:   clear to auscultation, symmetrical breath sounds, no wheezes, no rhonchi   CV:   RRR, grade IV/VI holosystolic murmur   Abdomen:  soft, non-tender, no masses   Extremities:  warm, well-perfused, pulses diminished but palpable, no LE edema  Rectal/GU  Deferred  Neuro:   Grossly non-focal and symmetrical throughout  Skin:   Clean and dry, no rashes, no breakdown   Diagnostic Tests:  ECHOCARDIOGRAM REPORT       Patient Name:   Calvin [name]  Date of Exam: 02/13/2019 Medical Rec #:  [id number]     Height:       73.0 in Accession #:    [id number]    Weight:       226.8 lb Date of Birth:  [birth year]      BSA:          2.27 m Patient Age:    52 years      BP:           146/86 mmHg Patient Gender: M             HR:           64 bpm. Exam Location:  [address]    Procedure: 2D Echo, Cardiac Doppler and Color Doppler  Indications:    R06.02 Shortness of Breath                 I34.0 Mitral Valve Insufficiency                 I05.9 Mitral Valve Disorder   History:        Patient has prior history of Echocardiogram examinations, most                 recent 05/13/2017. Previous Myocardial Infarction and CAD, Mitral                 Valve Prolapse and Mitral Valve Disease; Risk Factors:                 Dyslipidemia and Family History of Coronary Artery Disease.                 Asthma.   Sonographer:    Farrel Conners RDCS Referring Phys: [phone] Barry Dienes  [facility]  IMPRESSIONS    1. The left ventricle has hyperdynamic systolic function, with an ejection fraction of >65%. The cavity size was normal. There is mildly increased left ventricular wall thickness. Left ventricular diastolic parameters were normal.  2. The right ventricle has normal systolic function. The cavity was normal. There is no increase in right ventricular wall thickness.  3. Left atrial size was moderately dilated.  4. Severe mitral valve prolapse.  5. The mitral valve is myxomatous. Mitral valve regurgitation is mild to moderate by color flow Doppler. Severe holosystolic prolaspe of the posterior leaflet  is noted.  6. The interatrial septum was not well visualized.  7. The aortic valve is tricuspid.  8. The ascending aorta and aortic root are normal in size and structure.  9. The tricuspid valve is normal in structure. 10. When compared to the prior study: Compared to a prior study in 2018, the degree of MR does not appear worse however, the posterior leaflet prolapses significantly. There is now moderate LAE.  SUMMARY   LVEF 65-70%, mild LVH, normal wall motion, moderate LAE, severe holosystolic prolapse of the posterior mitral leaflet with mild to moderate regurgitation. Consider TEE evaluation as the eccentric MR jet may underestimate the severity of regurgitation.  FINDINGS  Left Ventricle: The left ventricle has hyperdynamic systolic function, with an ejection fraction of >65%. The cavity size was normal. There is mildly increased left ventricular wall thickness. Left ventricular diastolic parameters were normal Normal  left ventricular filling pressures Right Ventricle: The right ventricle has normal systolic function. The cavity was normal. There is no increase in right ventricular wall thickness.     Left Atrium: left atrial size was moderately dilated Right Atrium: right atrial size was normal in size.   Interatrial Septum: The interatrial septum was not  well visualized. Pericardium: There is no evidence of pericardial effusion. Mitral Valve: The mitral valve is myxomatous. Mitral valve regurgitation is mild to moderate by color flow Doppler. There is severe holosystolic prolapse of of the posterior mitral leaflet of the mitral valve. Tricuspid Valve: The tricuspid valve is normal in structure. Tricuspid valve regurgitation is trivial by color flow Doppler. Aortic Valve: The aortic valve is tricuspid Aortic valve regurgitation was not visualized by color flow Doppler. There is no evidence of aortic valve stenosis. Late systolic prolaspe of the posterior mitral leaflet. Pulmonic Valve: The pulmonic valve was not well visualized. Pulmonic valve regurgitation is not visualized by color flow Doppler. Aorta: The ascending aorta and aortic root are normal in size and structure. In comparison to the previous echocardiogram(s): Compared to a prior study in 2018, the degree of MR does not appear worse however, the posterior leaflet prolapses significantly. There is now moderate LAE.   LEFT VENTRICLE PLAX 2D (Teich) LV EF:          75.0 %   Diastology LVIDd:          5.06 cm  LV e' lateral:   12.40 cm/s LVIDs:          2.83 cm  LV E/e' lateral: 9.9 LV PW:          1.22 cm  LV e' medial:    9.79 cm/s LV IVS:         1.04 cm  LV E/e' medial:  12.5 LVOT diam:      2.40 cm LV SV:          91 ml LVOT Area:      4.52 cm  RIGHT VENTRICLE RV S prime:     13.50 cm/s TAPSE (M-mode): 2.2 cm  LEFT ATRIUM              Index       RIGHT ATRIUM           Index LA diam:        5.00 cm  2.20 cm/m  RA Area:     18.80 cm LA Vol (A2C):   114.0 ml 50.23 ml/m RA Volume:   54.30 ml  23.92 ml/m LA Vol (A4C):   63.2 ml  27.84 ml/m LA Biplane Vol: 90.7 ml  39.96 ml/m  AORTIC VALVE LVOT Vmax:   126.33 cm/s LVOT Vmean:  75.733 cm/s LVOT VTI:    0.260 m   AORTA Ao Root diam: 3.70 cm Ao Asc diam:  3.00 cm  MITRAL VALVE MV Area (PHT): 1.65 cm MV Peak grad:   8.1 mmHg MV Mean grad:  2.0 mmHg MV Vmax:       1.42 m/s MV Vmean:      64.2 cm/s MV VTI:        0.40 m MV PHT:        133 msec MV Decel Time: 234 msec MR Peak grad: 146.9 mmHg MR Mean grad: 82.3 mmHg MR Vmax:      606.00 cm/s MR Vmean:     417.3 cm/s MR PISA:      0.57 MV E velocity: 122.50 cm/s MV A velocity: 93.35 cm/s MV E/A ratio:  1.31    Zoila ShutterKenneth Hilty MD Electronically signed by Zoila ShutterKenneth Hilty MD Signature Date/Time: 02/13/2019/1:31:00 PM      TRANSESOPHOGEAL ECHO REPORT       Patient Name:   Ginger OrganNY Balderston Date of Exam: 03/05/2019 Medical Rec #:  [id number]    Height:       73.0 in Accession #:    [id number]   Weight:       215.0 lb Date of Birth:  01/01/1966     BSA:          2.22 m Patient Age:    52 years     BP:           115/67 mmHg Patient Gender: M            HR:           72 bpm. Exam Location:  Outpatient    Procedure: Transesophageal Echo, 3D Echo, Color Doppler and Cardiac Doppler  Indications:     Mitral insufficiency   History:         Patient has no prior history of Echocardiogram examinations and                  Patient has prior history of Echocardiogram examinations, most                  recent 02/13/2019. CAD and Previous Myocardial Infarction,                  Mitral Valve Prolapse; Risk Factors: Dyslipidemia.   Sonographer:     Leta Junglingiffany Cooper RDCS Referring Phys:  [phone] Barry DienesHENRY W [facility] LLCMITH Diagnosing Phys: Chilton Siiffany Chemung MD     PROCEDURE: No evidence of intracardiac thrombus. No evidence of vegetation. Consent was requested emergently by emergency room physicain. After discussion of the risks and benefits of a TEE, an informed consent was obtained from the patient. Local  oropharyngeal anesthetic was provided with viscous lidocaine. Patients was monitored while under deep sedation. Imaged were obtained with the patient in a left lateral decubitus position. Image quality was excellent. The patient's vital signs; including  heart rate,  blood pressure, and oxygen saturation; remained stable throughout the procedure. The patient developed no complications during the procedure.  IMPRESSIONS    1. The left ventricle has normal systolic function, with an ejection fraction of 60-65%. The cavity size was normal. Left ventricular diastolic function could not be evaluated.  2. The right ventricle has normal systolc function. The cavity was normal. There is no increase in right ventricular wall thickness.  3. Left atrial size was mildly dilated.  4. Mitral valve regurgitation is severe by color flow Doppler. The MR jet is eccentric medially directed.  5. There is severe mitral valve regurgitation with a regurgitant fraction of 54.60 %.  6. Severe prolapse of the P2 segment of the mitral valve.     PISA radius 0.97 cm. ERO 0.33 cm^2. Regurgitant volume 69.6 ml.  7. No stenosis of the aortic valve.  8. The ascending aorta and descending aorta are normal in size and structure.  FINDINGS  Left Ventricle: The left ventricle has normal systolic function, with an ejection fraction of 60-65%. The cavity size was normal. There is no increase in left ventricular wall thickness. Left ventricular diastolic function could not be evaluated. Right Ventricle: The right ventricle has normal systolic function. The cavity was normal. There is no increase in right ventricular wall thickness. Left Atrium: Left atrial size was mildly dilated. Right Atrium: Right atrial size was normal in size. Interatrial Septum: No atrial level shunt detected by color flow Doppler. Pericardium: There is no evidence of pericardial effusion. Mitral Valve: Mitral valve regurgitation is severe by color flow Doppler. The MR jet is eccentric medially directed. Pulmonary venous flow shows systolic flow reversal. There is severe mitral valve regurgitation with a regurgitant fraction of 54.60 %.  Severe prolapse of the P2 segment of the mitral valve. PISA radius 0.97 cm. ERO  0.33 cm^2. Regurgitant volume 69.6 ml. Tricuspid Valve: Tricuspid valve regurgitation was not visualized by color flow Doppler. Aortic Valve: Aortic valve regurgitation was not visualized by color flow Doppler. There is no stenosis of the aortic valve. Pulmonic Valve: No evidence of pulmonic stenosis. Aorta: The ascending aorta are normal in size and structure. Venous: The inferior vena cava is normal in size with greater than 50% respiratory variability.   LEFT VENTRICLE PLAX 2D (Teich) LVOT diam:      2.61 cm LVOT Area:      5.34 cm  AORTIC VALVE LVOT Vmax:   101.00 cm/s LVOT Vmean:  62.300 cm/s LVOT VTI:    0.109 m  MITRAL VALVE MV Peak grad: 7.7 mmHg MV Mean grad: 4.0 mmHg MV Vmax:      1.39 m/s MV Vmean:     91.6 cm/s MV VTI:       0.29 m MR Peak grad:            272.9 mmHg MR Mean grad:            137.0 mmHg MR Vmax:                 826.00 cm/s MR Vmean:                548.5 cm/s MR PISA:                 5.89 MR Regurgitant Fraction: 54.60 %    [name] MD Electronically signed by [name] MD Signature Date/Time: 03/05/2019/1:54:28 PM     RIGHT/LEFT HEART CATH AND CORONARY ANGIOGRAPHY  Conclusion    Left main contains up to 20% mid body narrowing.  The LAD contains luminal irregularities up to 30% of the proximal vessel and a widely patent stent in the proximal to mid vessel beyond the first diagonal.  The first diagonal stent is widely patent.  Ostial to proximal is eccentric 70 to 80%.  Circumflex coronary artery contains proximal to mid eccentric 50 to 60% narrowing.  RCA contains luminal irregularities up to 40% throughout  the proximal to mid vessel.  RCA is dominant.  2-3+ mitral regurgitation by ventriculography.  Severe mitral regurgitation by transesophageal echo.  Normal left ventricular systolic function with ejection fraction 55%.  Right heart pressures are normal without significant V wave.  RECOMMENDATIONS:   Severe  mitral regurgitation by echo in the setting of significant mitral valve prolapse.  Patient complains of dyspnea.  From hemodynamic and angiographic standpoint mitral regurgitation is is moderate.  Will refer to Dr. Cornelius Moras for consideration of mitral valve repair given symptoms of dyspnea.  Symptoms seem out of proportion to hemodynamics but early repair has been shown to be better than after LV enlargement.  Surgeon Notes     03/05/2019 10:44 AM CV Procedure addendum by [name], MD  Procedural Details   Technical Details The right radial area was sterilely prepped and draped. Intravenous sedation with Versed and fentanyl was administered. 1% Xylocaine was infiltrated to achieve local analgesia. Using real-time vascular ultrasound, a double wall stick with an angiocath was utilized to obtain intra-arterial access. A VUS image was saved for the permanent record.The modified Seldinger technique was used to place a 72F " Slender" sheath in the right radial artery. Weight based heparin was administered. Coronary angiography was done using 5 F catheters. Right coronary angiography was performed with a JR4. Left ventricular hemodymic recordings and angiography was done using the 5 French angled pigtail catheter and lower injection. Left coronary angiography was performed with a JL 3.5 cm.  Right heart catheterization was performed by exchanging a previously placed antecubital IV angio-cath for a 5 French Slender sheath. 1% Xylocaine was used to locally nesthetize the area around the IV site. The IV catheter was wired using an .018 guidewire. The modified Seldinger technique was used to place the 5 [place] sheath. Double glove technique was used to enhance sterility. After sheath insertion, right heart cath was performed using a 5 French balloon tipped catheter and fluoroscopic guidance. Pressures were recorded in each chamber and in the pulmonary capillary wedge position.. The main pulmonary artery O2  saturation was sampled.   Hemostasis was achieved using a pneumatic band.  During this procedure the patient is administered a total of Versed 1.5 mg and Fentanyl 50 mg to achieve and maintain moderate conscious sedation.  The patient's heart rate, blood pressure, and oxygen saturation are monitored continuously during the procedure. The period of conscious sedation is 47 minutes, of which I was present face-to-face 100% of this time. Estimated blood loss <50 mL.   During this procedure medications were administered to achieve and maintain moderate conscious sedation while the patient's heart rate, blood pressure, and oxygen saturation were continuously monitored and I was present face-to-face 100% of this time.  Medications  (Filter: Administrations occurring from 03/14/19 0947 to 03/14/19 1109)  Medication Rate/Dose/Volume Action  Date Time   Heparin (Porcine) in NaCl 1000-0.9 UT/500ML-% SOLN (mL) 500 mL Given 03/14/19 0955   Total dose as of 05/14/19 1623 500 mL Given 0955   1,000 mL        fentaNYL (SUBLIMAZE) injection (mcg) 25 mcg Given 03/14/19 1011   Total dose as of 05/14/19 1623 25 mcg Given 1026   50 mcg        midazolam (VERSED) injection (mg) 1 mg Given 03/14/19 1011   Total dose as of 05/14/19 1623 0.5 mg Given 1026   1.5 mg        lidocaine (PF) (XYLOCAINE) 1 % injection (mL) 2 mL Given 03/14/19 1022  Total dose as of 05/14/19 1623 2 mL Given 1031   4 mL        Radial Cocktail/Verapamil only (mL) 10 mL Given 03/14/19 1033   Total dose as of 05/14/19 1623        10 mL        heparin injection (Units) 5,000 Units Given 03/14/19 1039   Total dose as of 05/14/19 1623        5,000 Units        iohexol (OMNIPAQUE) 350 MG/ML injection (mL) 120 mL Given 03/14/19 1100   Total dose as of 05/14/19 1623        120 mL        Sedation Time   Sedation Time Physician-1: 47 minutes 34 seconds  Coronary Findings   Diagnostic  Dominance: Right  Left Main  Ost LM to Mid LM lesion  20% stenosed  Ost LM to Mid LM lesion is 20% stenosed.  Left Anterior Descending  Ost LAD to Prox LAD lesion 30% stenosed  Ost LAD to Prox LAD lesion is 30% stenosed.  Prox LAD lesion 10% stenosed  Prox LAD lesion is 10% stenosed. The lesion was previously treated.  First Diagonal Branch  Ost 1st Diag lesion 80% stenosed  Ost 1st Diag lesion is 80% stenosed.  Ost 1st Diag to 1st Diag lesion 10% stenosed  Ost 1st Diag to 1st Diag lesion is 10% stenosed. The lesion was previously treated.  Left Circumflex  Vessel is small.  Prox Cx lesion 50% stenosed  Prox Cx lesion is 50% stenosed.  First Obtuse Marginal Branch  Vessel is small in size.  Third Obtuse Marginal Branch  Vessel is large in size.  Right Coronary Artery  Prox RCA to Mid RCA lesion 40% stenosed  Prox RCA to Mid RCA lesion is 40% stenosed.  Intervention   No interventions have been documented.  Right Heart   Right Heart Pressures Hemodynamic findings consistent with pulmonary hypertension. LV EDP is normal. Mean wedge pressure is 10 mmHg  Wall Motion   Resting       Ventricular ectopy degrades the quality of ventriculography.          Left Heart   Left Ventricle The left ventricular systolic function is normal. LV end diastolic pressure is normal. The left ventricular ejection fraction is 55-65% by visual estimate.  Mitral Valve There is moderate (3+) mitral regurgitation. MR is 2-3+  Coronary Diagrams   Diagnostic  Dominance: Right    Intervention   Implants    No implant documentation for this case.  Syngo Images   Show images for CARDIAC CATHETERIZATION  Images on Long Term Storage   Show images for Hamdan, Toscano to Procedure Log   Procedure Log    Hemo Data    Most Recent Value  Fick Cardiac Output 6.18 L/min  Fick Cardiac Output Index 2.81 (L/min)/BSA  RA A Wave 6 mmHg  RA V Wave 5 mmHg  RA Mean 3 mmHg  RV Systolic Pressure 30 mmHg  RV Diastolic Pressure 1 mmHg  RV EDP 7  mmHg  PA Systolic Pressure 23 mmHg  PA Diastolic Pressure 5 mmHg  PA Mean 15 mmHg  PW A Wave 15 mmHg  PW V Wave 16 mmHg  PW Mean 10 mmHg  AO Systolic Pressure 120 mmHg  AO Diastolic Pressure 76 mmHg  AO Mean 98 mmHg  LV Systolic Pressure 123 mmHg  LV Diastolic Pressure 4 mmHg  LV EDP  15 mmHg  AOp Systolic Pressure 125 mmHg  AOp Diastolic Pressure 72 mmHg  AOp Mean Pressure 97 mmHg  LVp Systolic Pressure 128 mmHg  LVp Diastolic Pressure 5 mmHg  LVp EDP Pressure 22 mmHg  QP/QS 1  TPVR Index 5.33 HRUI  TSVR Index 34.8 HRUI  PVR SVR Ratio 0.05  TPVR/TSVR Ratio 0.15      Impression:  Patient has mitral valve prolapse with stage D severe symptomatic primary mitral regurgitation.  He describes recent progression of symptoms of exertional shortness of breath and fatigue consistent with chronic diastolic congestive heart failure, New York Heart Association functional class II.  He has not been having any associated exertional chest pain or chest tightness.  I have personally reviewed the patient's recent echocardiograms and diagnostic cardiac catheterization.  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  There are multiple elongated and possible ruptured primary chordae tendinae.  The jet of regurgitation is quite eccentric.  Left ventricular size and systolic function remain normal.  Catheterization reveals multivessel coronary artery disease but no significant flow-limiting plaque other than 70 to 80% ostial stenosis of a small to medium-sized first diagonal branch off of the left anterior descending coronary artery.  Right heart pressures were normal.  I agree the patient would likely benefit from elective mitral valve repair.  I do not feel that the patient's coronary artery disease is severe enough to warrant concomitant coronary artery bypass grafting.   Plan:  The patient was  counseled at length regarding the indications, risks and potential benefits of mitral valve repair.  The rationale for elective surgery has been explained, including a comparison between surgery and continued medical therapy with close follow-up.  The likelihood of successful and durable mitral valve repair has been discussed with particular reference to the findings of their recent echocardiogram.  Based upon these findings and previous experience, I have quoted them a greater than 95 percent likelihood of successful valve repair with less than 1 percent risk of mortality or major morbidity.  Alternative surgical approaches have been discussed including a comparison between conventional sternotomy and minimally-invasive techniques.  The relative risks and benefits of each have been reviewed as they pertain to the patient's specific circumstances, and expectations for the patient's postoperative convalescence has been discussed.  The patient desires to proceed with surgery as soon as practical.    The patient understands and accepts all potential risks of surgery including but not limited to risk of death, stroke or other neurologic complication, myocardial infarction, congestive heart failure, respiratory failure, renal failure, bleeding requiring transfusion and/or reexploration, arrhythmia, infection or other wound complications, pneumonia, pleural and/or pericardial effusion, pulmonary embolus, aortic dissection or other major vascular complication, or delayed complications related to valve repair or replacement including but not limited to structural valve deterioration and failure, thrombosis, embolization, endocarditis, or paravalvular leak.  Specific risks potentially related to the minimally-invasive approach were discussed at length, including but not limited to risk of conversion to full or partial sternotomy, aortic dissection or other major vascular complication, unilateral acute lung injury or  pulmonary edema, phrenic nerve dysfunction or paralysis, rib fracture, chronic pain, lung hernia, or lymphocele. All of their questions have been answered.  As a neck step the patient will undergo CT angiography to evaluate the feasibility of peripheral cannulation for surgery.  I have also instructed the patient to see his local dentist soon as possible for routine dental exam  and cleaning because he has not seen a dentist in more than 5 years.  We have offered to refer him to the [facility] dental service clinic but he plans to contact his own personal dentist as soon as possible.  We tentatively plan for surgery on May 31, 2019.  The patient will return to our office to review the results of his CT angiogram prior to surgery on May 28, 2019.     I spent in excess of 90 minutes during the conduct of this office consultation and >50% of this time involved direct face-to-face encounter with the patient for counseling and/or coordination of their care.    Salvatore Decent. Cornelius Moras, MD 05/14/2019 2:00pm

## 2019-05-15 ENCOUNTER — Encounter: Payer: Self-pay | Admitting: *Deleted

## 2019-05-15 ENCOUNTER — Other Ambulatory Visit: Payer: Self-pay | Admitting: *Deleted

## 2019-05-15 DIAGNOSIS — I340 Nonrheumatic mitral (valve) insufficiency: Secondary | ICD-10-CM

## 2019-05-16 ENCOUNTER — Ambulatory Visit (HOSPITAL_COMMUNITY): Payer: Self-pay | Admitting: Dentistry

## 2019-05-16 ENCOUNTER — Other Ambulatory Visit: Payer: Self-pay

## 2019-05-16 ENCOUNTER — Encounter (HOSPITAL_COMMUNITY): Payer: Self-pay | Admitting: Dentistry

## 2019-05-16 VITALS — BP 155/99 | HR 65 | Temp 99.0°F

## 2019-05-16 DIAGNOSIS — K08409 Partial loss of teeth, unspecified cause, unspecified class: Secondary | ICD-10-CM

## 2019-05-16 DIAGNOSIS — I340 Nonrheumatic mitral (valve) insufficiency: Secondary | ICD-10-CM

## 2019-05-16 DIAGNOSIS — K036 Deposits [accretions] on teeth: Secondary | ICD-10-CM

## 2019-05-16 DIAGNOSIS — K0530 Chronic periodontitis, unspecified: Secondary | ICD-10-CM

## 2019-05-16 DIAGNOSIS — M2630 Unspecified anomaly of tooth position of fully erupted tooth or teeth: Secondary | ICD-10-CM

## 2019-05-16 DIAGNOSIS — Z01818 Encounter for other preprocedural examination: Secondary | ICD-10-CM

## 2019-05-16 DIAGNOSIS — M264 Malocclusion, unspecified: Secondary | ICD-10-CM

## 2019-05-16 DIAGNOSIS — M2632 Excessive spacing of fully erupted teeth: Secondary | ICD-10-CM

## 2019-05-16 DIAGNOSIS — K06010 Localized gingival recession, unspecified: Secondary | ICD-10-CM

## 2019-05-16 DIAGNOSIS — K0889 Other specified disorders of teeth and supporting structures: Secondary | ICD-10-CM

## 2019-05-16 DIAGNOSIS — K031 Abrasion of teeth: Secondary | ICD-10-CM

## 2019-05-16 DIAGNOSIS — K0850 Unsatisfactory restoration of tooth, unspecified: Secondary | ICD-10-CM

## 2019-05-16 DIAGNOSIS — M270 Developmental disorders of jaws: Secondary | ICD-10-CM

## 2019-05-16 MED ORDER — CHLORHEXIDINE GLUCONATE 0.12 % MT SOLN: OROMUCOSAL | 99 refills | Status: DC

## 2019-05-16 NOTE — Progress Notes (Signed)
DENTAL CONSULTATION  Date of Consultation:  05/16/2019 Patient Name:   Calvin [name] Date of Birth:   02-13-1966 Medical Record Number: [id number]  COVID 19 SCREENING: The patient does not symptoms concerning for COVID-19 infection (Including fever, chills, cough, or new SHORTNESS OF BREATH).   VITALS: BP (!) 155/99 (BP Location: Right Arm)   Pulse 65   Temp 99 F (37.2 C)    CHIEF COMPLAINT: Patient was referred by Dr. Cornelius Moras for dental consultation.  HPI: Calvin [name] is a 53 year old male recently diagnosed with severe mitral regurgitation. Patient with anticipated mitral valve repair with Dr. Cornelius Moras. Patient is now seen as part of a medically necessary pre-heart valve surgery dental protocol examination to rule out dental infection that may affect the patient's systemic health and anticipated heart valve surgery.  The patient currently denies acute toothaches, swellings, or abscesses. Patient last saw his primary dentist, Dr. Richrd Humbles, in January 2016 for an exam and cleaning.  The patient denies having any partial dentures. The patient denies having any dental phobia. The patient does have a history of orthodontic therapy as a teenager.  PROBLEM LIST: Patient Active Problem List   Diagnosis Date Noted  . Severe mitral regurgitation 05/16/2019    Priority: High  . Dyspnea on exertion 03/14/2019  . Syncope 04/22/2016  . Kidney disease 04/22/2016  . Faintness   . CAD in native artery 07/01/2015  . Other and unspecified angina pectoris 04/16/2014  . Mitral valve prolapse 04/09/2014  . H/O psoriasis 04/09/2014  . GI bleed 03/27/2014  . Dyslipidemia, goal LDL below 70 03/27/2014  . Acute sinus infection 03/27/2014  . Common variable immunodeficiency (HCC) 10/21/2013  . Nasal polyp 03/26/2012  . 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 Chronic maxillary sinusitis 12/19/2011    PMH: Past Medical History:  Diagnosis Date  . ACS (acute coronary syndrome) with NSTEMI 03/25/2014   . Acute sinus infection 03/27/2014  . Asthma   . Asthmatic bronchitis   . CAD (coronary artery disease), native coronary artery, DES placed to 1st diag. 03/26/14; residual disease to LAD with FFR to 0.62 and RCA of 50-60%- plan for PCI to LAD 03/27/2014  . Dyslipidemia, goal LDL below 70 03/27/2014  . GI bleed 03/27/2014  . Hyperlipemia   . MVP (mitral valve prolapse)    Dr [name]  . Non Q wave myocardial infarction (HCC) 03/23/2014  . Psoriasis   . Rhinitis     PSH: Past Surgical History:  Procedure Laterality Date  . CORONARY ANGIOPLASTY WITH STENT PLACEMENT  03/25/2014; 04/16/2014   "1 + 1"  . FLEXIBLE SIGMOIDOSCOPY Left 03/28/2014   Procedure: FLEXIBLE SIGMOIDOSCOPY;  Surgeon: [name], MD;  Location: [facility] ENDOSCOPY;  Service: Endoscopy;  Laterality: Left;  . LEFT HEART CATHETERIZATION WITH CORONARY ANGIOGRAM N/A 03/26/2014   Procedure: LEFT HEART CATHETERIZATION WITH CORONARY ANGIOGRAM;  Surgeon: [name], MD;  Location: [facility] CATH LAB;  Service: Cardiovascular;  Laterality: N/A;  . NO PAST SURGERIES    . PERCUTANEOUS CORONARY STENT INTERVENTION (PCI-S) N/A 04/16/2014   Procedure: PERCUTANEOUS CORONARY STENT INTERVENTION (PCI-S);  Surgeon: [name], MD;  Location: [facility] CATH LAB;  Service: Cardiovascular;  Laterality: N/A;  . RIGHT/LEFT HEART CATH AND CORONARY ANGIOGRAPHY N/A 03/14/2019   Procedure: RIGHT/LEFT HEART CATH AND CORONARY ANGIOGRAPHY;  Surgeon: [name], MD;  Location: [facility] INVASIVE CV LAB;  Service: Cardiovascular;  Laterality: N/A;  . TEE WITHOUT CARDIOVERSION N/A 03/05/2019   Procedure: TRANSESOPHAGEAL ECHOCARDIOGRAM (TEE);  Surgeon: [name],  MD;  Location: [facility] ENDOSCOPY;  Service: Cardiovascular;  Laterality: N/A;    ALLERGIES: Allergies  Allergen Reactions  . Brilinta [Ticagrelor] Other (See Comments)    GI bleed  . Daliresp [Roflumilast] Other (See Comments)    GI UPSET    MEDICATIONS: Current Outpatient Medications  Medication Sig Dispense  Refill  . albuterol (PROAIR HFA) 108 (90 BASE) MCG/ACT inhaler Inhale 2 puffs into the lungs every 6 (six) hours as needed for wheezing or shortness of breath. 1 Inhaler prn  . amLODipine (NORVASC) 5 MG tablet Take 1 tablet (5 mg total) by mouth daily. Please call and schedule an appointment for further refills 1st attempt 90 tablet 0  . cetirizine (ZYRTEC) 10 MG tablet Take 10 mg by mouth daily.    . fenofibrate 160 MG tablet Take 160 mg by mouth daily.     . fluticasone (CUTIVATE) 0.05 % cream Apply 1 application topically 2 (two) times daily as needed (psoriasis).    . fluticasone (FLONASE) 50 MCG/ACT nasal spray Place 1 spray into both nostrils daily.     . mometasone-formoterol (DULERA) 100-5 MCG/ACT AERO Inhale 1 puff into the lungs 2 (two) times daily.    . rosuvastatin (CRESTOR) 5 MG tablet Take 1 tablet (5 mg total) by mouth daily. 90 tablet 3  . aspirin 81 MG tablet Take 1 tablet (81 mg total) by mouth daily. (Patient not taking: Reported on 05/16/2019)    . Multiple Vitamin (MULTIVITAMIN WITH MINERALS) TABS tablet Take 1 tablet by mouth daily.    . nitroGLYCERIN (NITROSTAT) 0.4 MG SL tablet Place 1 tablet (0.4 mg total) under the tongue every 5 (five) minutes as needed for chest pain. Up to 3 doses maximum (Patient not taking: Reported on 05/16/2019) 25 tablet 3   No current facility-administered medications for this visit.      LABS:  COVID-19 Labs No results found for: SARSCOV2NAA  Lab Results  Component Value Date   WBC 9.5 03/08/2019   HGB 12.9 (L) 03/14/2019   HCT 38.0 (L) 03/14/2019   MCV 87 03/08/2019   PLT 390 03/08/2019      Component Value Date/Time   NA 142 03/14/2019 1047   NA 140 03/08/2019 0908   K 3.7 03/14/2019 1047   CL 103 03/08/2019 0908   CO2 19 (L) 03/08/2019 0908   GLUCOSE 93 03/08/2019 0908   GLUCOSE 103 (H) 04/23/2016 0041   BUN 13 03/08/2019 0908   CREATININE 1.10 03/08/2019 0908   CALCIUM 9.6 03/08/2019 0908   GFRNONAA 76 03/08/2019 0908    GFRAA 88 03/08/2019 0908   Lab Results  Component Value Date   INR 1.1 (H) 04/15/2014   No results found for: PTT  SOCIAL HISTORY: Social History   Socioeconomic History  . Marital status: Married    Spouse name: Not on file  . Number of children: 0  . Years of education: Not on file  . Highest education level: Not on file  Occupational History  . Occupation: Insurance underwritercomputer tech  Social Needs  . Financial resource strain: Not on file  . Food insecurity:    Worry: Not on file    Inability: Not on file  . Transportation needs:    Medical: Not on file    Non-medical: Not on file  Tobacco Use  . Smoking status: Never Smoker  . Smokeless tobacco: Never Used  Substance and Sexual Activity  . Alcohol use: No  . Drug use: No  . Sexual activity: Yes  Lifestyle  .  Physical activity:    Days per week: Not on file    Minutes per session: Not on file  . Stress: Not on file  Relationships  . Social connections:    Talks on phone: Not on file    Gets together: Not on file    Attends religious service: Not on file    Active member of club or organization: Not on file    Attends meetings of clubs or organizations: Not on file    Relationship status: Not on file  . Intimate partner violence:    Fear of current or ex partner: Not on file    Emotionally abused: Not on file    Physically abused: Not on file    Forced sexual activity: Not on file  Other Topics Concern  . Not on file  Social History Narrative  . Not on file    FAMILY HISTORY: Family History  Problem Relation Age of Onset  . Heart disease Mother        deceased age 27  . Emphysema Maternal Grandmother        non smoker  . Asthma Maternal Grandmother   . Hypertension Maternal Grandmother   . Prostate cancer Paternal Grandfather   . Heart disease Maternal Uncle        valve replaced; deceased age 53  . Stroke Maternal Uncle   . Heart attack Neg Hx     REVIEW OF SYSTEMS: Reviewed with the patient as per  History of present illness. Psych: Patient denies having dental phobia.  DENTAL HISTORY: CHIEF COMPLAINT: Patient was referred by Dr. Cornelius Moras for dental consultation.  HPI: Calvin [name] is a 53 year old male recently diagnosed with severe mitral regurgitation. Patient with anticipated mitral valve repair with Dr. Cornelius Moras. Patient is now seen as part of a medically necessary pre-heart valve surgery dental protocol examination to rule out dental infection that may affect the patient's systemic health and anticipated heart valve surgery.  The patient currently denies acute toothaches, swellings, or abscesses. Patient last saw his primary dentist, Dr. Richrd Humbles, in January 2016 for an exam and cleaning.  The patient denies having any partial dentures. The patient denies having any dental phobia. The patient does have a history of orthodontic therapy as a teenager.  DENTAL EXAMINATION: GENERAL:  The patient is a well-developed, well-nourished male in no acute distress. HEAD AND NECK:  There is no palpable neck lymphadenopathy. The patient denies acute TMJ symptoms. INTRAORAL EXAM:  Patient has normal saliva. There is no evidence of oral abscess formation. The patient has a small mandibular right lingual torus. DENTITION:  The patient is missing tooth numbers 1, 16, 17, and 32.  There are multiple flexure lesions with possible concomitant toothbrush abrasion.  The patient has an open bite in the area of the premolars and canines.  Patient has lower anterior malpositioned teeth. There are multiple diastemas noted. PERIODONTAL:  Patient has chronic periodontitis with accretions, gingival recession, and incipient tooth mobility. There is incipient to moderate bone loss noted. DENTAL CARIES/SUBOPTIMAL RESTORATIONS:  Patient has incipient caries on tooth #13 on the mesial. Patient has several suboptimal dental restorations as per dental charting form. Patient has multiple flexure lesions. ENDODONTIC:  The  patient currently denies acute pulpitis symptoms. There is no evidence of periapical pathology or radiolucency. CROWN AND BRIDGE:  There are no crown or bridge restorations noted. Tooth #19 and could be evaluated for future crown restoration once he is stable from the anticipated heart valve surgery. PROSTHODONTIC:  No history of partial dentures. OCCLUSION: The patient has a poor occlusal scheme secondary to missing teeth, malpositioned teeth, multiple diastemas, and open bite of the premolars and canines. The patient does have a history of orthodontic therapy as a teenager.  RADIOGRAPHIC INTERPRETATION: An orthopantogram was taken and supplemented with a full series of dental radiographs. There are multiple missing teeth. Multiple diastemas are noted. There is incipient to moderate bone loss noted. Multiple amalgam restorations are noted.  There is a suboptimal dental restoration associated with tooth #19.There is incipient caries on the mesial of tooth #13. There are foreshortened roots associated with the maxillary anterior teeth consistent with previous orthodontic therapy.   ASSESSMENTS: 1. Severe mitral regurgitation 2. Pre-heart valve surgery dental protocol 3. Multiple flexure lesions 4. Tooth brush abrasion 5. Chronic periodontitis with bone loss 6. Gingival recession 7. Accretions 8. Mandibular anterior incipient tooth mobility 9. Missing tooth numbers 1, 16, 17, and 32. 10. Multiple diastemas 11. Open bite of the premolars and canines. 12. Poor occlusal scheme and malocclusion 13. Need for antibiotic premedication prior to invasive dental procedures after the anticipated heart valve surgery per American Heart Association guidelines.  PLAN/RECOMMENDATIONS: 1. I discussed the risks, benefits, and complications of various treatment options with the patient in relationship to his medical and dental conditions, anticipated heart valve surgery, and risk for endocarditis. We discussed  various treatment options to include no treatment, periodontal therapy, dental restorations, root canal therapy, crown and bridge therapy, occlusal splint therapy,implant therapy, and replacement of missing teeth as indicated. The patient currently wishes to defer any dental treatment at this time. The patient follow-up with his primary dentist, Dr. Azucena Cecil, once he is medically stable from the anticipated heart valve surgery for periodontal therapy, dental restorations, crown and bridge therapy, and evaluation for occlusal splint therapy or additional orthodontic therapy as indicated. Patient is aware that he will need antibiotic premedication prior to invasive dental procedures after the anticipated heart valve repair surgery. The patient did agree to use of chlorhexidine rinses on a twice a day basis for the next 6 months to aid in this infection of the oral cavity.   2. Discussion of findings with medical team and coordination of future medical and dental care as needed.     The patient is currently cleared for the heart valve surgery with Dr. Cornelius Moras.  I spent in excess of  120 minutes during the conduct of this consultation and >50% of this time involved direct face-to-face encounter for counseling and/or coordination of the patient's care.    Charlynne Pander, DDS

## 2019-05-16 NOTE — Patient Instructions (Signed)
COVID-19 Education: The signs and symptoms of COVID-19 were discussed with the patient and how to seek care for testing (follow up with PCP or arrange E-visit).   The importance of social distancing was discussed today.           [place]    Department of Dental Medicine     DR. KULINSKI      HEART VALVES AND MOUTH CARE:  FACTS:   If you have any infection in your mouth, it can infect your heart valve.  If you heart valve is infected, you will be seriously ill.  Infections in the mouth can be SILENT and do not always cause pain.  Examples of infections in the mouth are gum disease, dental cavities, and abscesses.  Some possible signs of infection are: Bad breath, bleeding gums, or teeth that are sensitive to sweets, hot, and/or cold. There are many other signs as well.  WHAT YOU HAVE TO DO:   Brush your teeth after meals and at bedtime. Spend at least 2 minutes brushing well, especially behind your back teeth and all around your teeth that stand alone. Brush at the gumline also.  Do not go to bed without brushing your teeth and flossing.  If you gums bleed when you brush or floss, do NOT stop brushing or flossing. It usually means that your gums need more attention and better cleaning.   If your Dentist or Dr. Kulinski gave you a prescription mouthwash to use, make sure to use it as directed. If you run out of the medication, get a refill at the pharmacy.   If you were given any other medications or directions by your Dentist, please follow them. If you did not understand the directions or forget what you were told, please call. We will be happy to refresh her memory.  If you need antibiotics before dental procedures, make sure you take them one hour prior to every dental visit as directed.   Get a dental checkup every 4-6 months in order to keep your mouth healthy, or to find and treat any new infection. You will most likely need your teeth cleaned or gums treated at the  same time.  If you are not able to come in for your scheduled appointment, call your Dentist as soon as possible to reschedule.  If you have a problem in between dental visits, call your Dentist.  

## 2019-05-17 ENCOUNTER — Encounter: Payer: BLUE CROSS/BLUE SHIELD | Admitting: Thoracic Surgery (Cardiothoracic Vascular Surgery)

## 2019-05-18 ENCOUNTER — Other Ambulatory Visit: Payer: Self-pay | Admitting: Thoracic Surgery (Cardiothoracic Vascular Surgery)

## 2019-05-22 ENCOUNTER — Ambulatory Visit
Admission: RE | Admit: 2019-05-22 | Discharge: 2019-05-22 | Disposition: A | Discharge status: Home / Self Care | Payer: BLUE CROSS/BLUE SHIELD | Source: Ambulatory Visit | Attending: Thoracic Surgery (Cardiothoracic Vascular Surgery) | Admitting: Thoracic Surgery (Cardiothoracic Vascular Surgery)

## 2019-05-22 DIAGNOSIS — I340 Nonrheumatic mitral (valve) insufficiency: Secondary | ICD-10-CM | POA: Diagnosis not present

## 2019-05-22 DIAGNOSIS — Z01818 Encounter for other preprocedural examination: Secondary | ICD-10-CM

## 2019-05-22 MED ORDER — IOPAMIDOL (ISOVUE-370) INJECTION 76%
100.0000 mL | Freq: Once | INTRAVENOUS | Status: AC | PRN
  Administered 2019-05-22: 10:00:00 75 mL via INTRAVENOUS

## 2019-05-24 ENCOUNTER — Telehealth: Payer: Self-pay | Admitting: Interventional Cardiology

## 2019-05-24 NOTE — Telephone Encounter (Signed)
Attempted to contact pt.  Phone rang several times with no answer and then disconnected.  Needs to keep lab appt at our office as the lipid panel we need is not part of his procedure work up.

## 2019-05-24 NOTE — Telephone Encounter (Signed)
New Message    Pt is wondering if it is necessary for him to go to the office for labs because he is scheduled for surgery and they will be doing the labs then   Please call

## 2019-05-25 ENCOUNTER — Other Ambulatory Visit: Payer: BLUE CROSS/BLUE SHIELD

## 2019-05-25 NOTE — Pre-Procedure Instructions (Signed)
[id number] [place], [place] - [address] [place] [id number] Phone: [phone] Fax: [phone]      Your procedure is scheduled on Thursday June 4th.  [facility] Entr[facility] "A" at 5:30 A.M., and check in at the Admitting office.  Call this number if you have problems the morning of surgery:  [phone]  Call [phone] if you have any questions prior to your surgery date Monday-Friday 8am-4pm    Remember:  Do not eat or drink after midnight.   Take these medicines the morning of surgery with A SIP OF WATER  amLODipine (NORVASC)  cetirizine (ZYRTEC) fluticasone (FLONASE) if needed mometasone-formoterol (DULERA) albuterol (PROAIR HFA)-if needed. Bring with you to the hospital day of surgery nitroGLYCERIN (NITROSTAT)  As of today, STOP taking any Aspirin (unless otherwise instructed by your surgeon), Aleve, Naproxen, Ibuprofen, Motrin, Advil, Goody's, BC's, all herbal medications, fish oil, and all vitamins.    The Morning of Surgery  Do not wear jewelry, make-up or nail polish.  Do not wear lotions, powders, or perfumes/colognes, or deodorant  Do not shave 48 hours prior to surgery.  Men may shave face and neck.  Do not bring valuables to the hospital.  [facility] is not responsible for any belongings or valuables.  If you are a smoker, DO NOT Smoke 24 hours prior to surgery IF you wear a CPAP at night please bring your mask, tubing, and machine the morning of surgery   Remember that you must have someone to transport you home after your surgery, and remain with you for 24 hours if you are discharged the same day.   Contacts, glasses, hearing aids, dentures or bridgework may not be worn into surgery.    Leave your suitcase in the car.  After surgery it may be brought to your room.  For patients admitted to the hospital, discharge time will be determined by your treatment team.  Patients discharged the day of  surgery will not be allowed to drive home.    Special instructions:   [facility]- Preparing For Surgery  Before surgery, you can play an important role. Because skin is not sterile, your skin needs to be as free of germs as possible. You can reduce the number of germs on your skin by washing with CHG (chlorahexidine gluconate) Soap before surgery.  CHG is an antiseptic cleaner which kills germs and bonds with the skin to continue killing germs even after washing.    Oral Hygiene is also important to reduce your risk of infection.  Remember - BRUSH YOUR TEETH THE MORNING OF SURGERY WITH YOUR REGULAR TOOTHPASTE  Please do not use if you have an allergy to CHG or antibacterial soaps. If your skin becomes reddened/irritated stop using the CHG.  Do not shave (including legs and underarms) for at least 48 hours prior to first CHG shower. It is OK to shave your face.  Please follow these instructions carefully.   1. Shower the NIGHT BEFORE SURGERY and the MORNING OF SURGERY with CHG Soap.   2. If you chose to wash your hair, wash your hair first as usual with your normal shampoo.  3. After you shampoo, rinse your hair and body thoroughly to remove the shampoo.  4. Use CHG as you would any other liquid soap. You can apply CHG directly to the skin and wash gently with a scrungie or a clean washcloth.   5. Apply the CHG Soap to your body  ONLY FROM THE NECK DOWN.  Do not use on open wounds or open sores. Avoid contact with your eyes, ears, mouth and genitals (private parts). Wash Face and genitals (private parts)  with your normal soap.   6. Wash thoroughly, paying special attention to the area where your surgery will be performed.  7. Thoroughly rinse your body with warm water from the neck down.  8. DO NOT shower/wash with your normal soap after using and rinsing off the CHG Soap.  9. Pat yourself dry with a CLEAN TOWEL.  10. Wear CLEAN PAJAMAS to bed the night before surgery, wear  comfortable clothes the morning of surgery  11. Place CLEAN SHEETS on your bed the night of your first shower and DO NOT SLEEP WITH PETS.    Day of Surgery:  Do not apply any deodorants/lotions.  Please wear clean clothes to the hospital/surgery center.   Remember to brush your teeth WITH YOUR REGULAR TOOTHPASTE.   Please read over the following fact sheets that you were given.

## 2019-05-28 ENCOUNTER — Ambulatory Visit (HOSPITAL_COMMUNITY)
Admission: RE | Admit: 2019-05-28 | Discharge: 2019-05-28 | Disposition: A | Discharge status: Home / Self Care | Payer: BC Managed Care – PPO | Source: Ambulatory Visit | Attending: Thoracic Surgery (Cardiothoracic Vascular Surgery) | Admitting: Thoracic Surgery (Cardiothoracic Vascular Surgery)

## 2019-05-28 ENCOUNTER — Encounter (HOSPITAL_COMMUNITY)
Admission: RE | Admit: 2019-05-28 | Discharge: 2019-05-28 | Disposition: A | Discharge status: Home / Self Care | Payer: BC Managed Care – PPO | Source: Ambulatory Visit | Attending: Thoracic Surgery (Cardiothoracic Vascular Surgery) | Admitting: Thoracic Surgery (Cardiothoracic Vascular Surgery)

## 2019-05-28 ENCOUNTER — Other Ambulatory Visit: Payer: Self-pay

## 2019-05-28 ENCOUNTER — Encounter (HOSPITAL_COMMUNITY): Payer: Self-pay

## 2019-05-28 ENCOUNTER — Other Ambulatory Visit (HOSPITAL_COMMUNITY)
Admission: RE | Admit: 2019-05-28 | Discharge: 2019-05-28 | Disposition: A | Discharge status: Home / Self Care | Payer: BC Managed Care – PPO | Source: Ambulatory Visit | Attending: Thoracic Surgery (Cardiothoracic Vascular Surgery) | Admitting: Thoracic Surgery (Cardiothoracic Vascular Surgery)

## 2019-05-28 ENCOUNTER — Ambulatory Visit: Payer: BLUE CROSS/BLUE SHIELD | Admitting: Thoracic Surgery (Cardiothoracic Vascular Surgery)

## 2019-05-28 ENCOUNTER — Encounter: Payer: Self-pay | Admitting: Thoracic Surgery (Cardiothoracic Vascular Surgery)

## 2019-05-28 VITALS — BP 147/89 | HR 93 | Temp 97.9°F | Resp 16 | Ht 73.0 in | Wt 215.0 lb

## 2019-05-28 DIAGNOSIS — I2510 Atherosclerotic heart disease of native coronary artery without angina pectoris: Secondary | ICD-10-CM | POA: Insufficient documentation

## 2019-05-28 DIAGNOSIS — I340 Nonrheumatic mitral (valve) insufficiency: Secondary | ICD-10-CM

## 2019-05-28 DIAGNOSIS — E785 Hyperlipidemia, unspecified: Secondary | ICD-10-CM | POA: Insufficient documentation

## 2019-05-28 DIAGNOSIS — Z1159 Encounter for screening for other viral diseases: Secondary | ICD-10-CM | POA: Insufficient documentation

## 2019-05-28 DIAGNOSIS — I341 Nonrheumatic mitral (valve) prolapse: Secondary | ICD-10-CM

## 2019-05-28 DIAGNOSIS — Z01810 Encounter for preprocedural cardiovascular examination: Secondary | ICD-10-CM | POA: Diagnosis not present

## 2019-05-28 DIAGNOSIS — I058 Other rheumatic mitral valve diseases: Secondary | ICD-10-CM | POA: Diagnosis not present

## 2019-05-28 DIAGNOSIS — Z01818 Encounter for other preprocedural examination: Secondary | ICD-10-CM | POA: Insufficient documentation

## 2019-05-28 HISTORY — DX: Essential (primary) hypertension: I10

## 2019-05-28 HISTORY — DX: Atherosclerotic heart disease of native coronary artery without angina pectoris: I25.10

## 2019-05-28 LAB — CBC
HCT: 43.5 % (ref 39.0–52.0)
Hemoglobin: 14.3 g/dL (ref 13.0–17.0)
MCH: 29.2 pg (ref 26.0–34.0)
MCHC: 32.9 g/dL (ref 30.0–36.0)
MCV: 88.8 fL (ref 80.0–100.0)
Platelets: 368 10*3/uL (ref 150–400)
RBC: 4.9 MIL/uL (ref 4.22–5.81)
RDW: 13.6 % (ref 11.5–15.5)
WBC: 10 10*3/uL (ref 4.0–10.5)
nRBC: 0 % (ref 0.0–0.2)

## 2019-05-28 LAB — COMPREHENSIVE METABOLIC PANEL
ALT: 37 U/L (ref 0–44)
AST: 32 U/L (ref 15–41)
Albumin: 4.3 g/dL (ref 3.5–5.0)
Alkaline Phosphatase: 58 U/L (ref 38–126)
Anion gap: 13 (ref 5–15)
BUN: 16 mg/dL (ref 6–20)
CO2: 16 mmol/L — ABNORMAL LOW (ref 22–32)
Calcium: 9.3 mg/dL (ref 8.9–10.3)
Chloride: 110 mmol/L (ref 98–111)
Creatinine, Ser: 1.04 mg/dL (ref 0.61–1.24)
GFR calc Af Amer: >60 mL/min (ref >60)
GFR calc non Af Amer: >60 mL/min (ref >60)
Glucose, Bld: 94 mg/dL (ref 70–99)
Potassium: 4.1 mmol/L (ref 3.5–5.1)
Sodium: 139 mmol/L (ref 135–145)
Total Bilirubin: 0.6 mg/dL (ref 0.3–1.2)
Total Protein: 6.5 g/dL (ref 6.5–8.1)

## 2019-05-28 LAB — BLOOD GAS, ARTERIAL
Acid-base deficit: 3.9 mmol/L — ABNORMAL HIGH (ref 0.0–2.0)
Bicarbonate: 20.1 mmol/L (ref 20.0–28.0)
Drawn by: 470591
FIO2: 0.21
O2 Saturation: 97.8 %
Patient temperature: 98.6
pCO2 arterial: 33.2 mmHg (ref 32.0–48.0)
pH, Arterial: 7.399 (ref 7.350–7.450)
pO2, Arterial: 109 mmHg — ABNORMAL HIGH (ref 83.0–108.0)

## 2019-05-28 LAB — TYPE AND SCREEN
ABO/RH(D): O POS
Antibody Screen: NEGATIVE

## 2019-05-28 LAB — URINALYSIS, ROUTINE W REFLEX MICROSCOPIC
Bilirubin Urine: NEGATIVE
Glucose, UA: NEGATIVE mg/dL
Hgb urine dipstick: NEGATIVE
Ketones, ur: NEGATIVE mg/dL
Leukocytes,Ua: NEGATIVE
Nitrite: NEGATIVE
Protein, ur: NEGATIVE mg/dL
Specific Gravity, Urine: 1.018 (ref 1.005–1.030)
pH: 5 (ref 5.0–8.0)

## 2019-05-28 LAB — PROTIME-INR
INR: 1.1 (ref 0.8–1.2)
Prothrombin Time: 14.3 seconds (ref 11.4–15.2)

## 2019-05-28 LAB — ABO/RH: ABO/RH(D): O POS

## 2019-05-28 LAB — HEMOGLOBIN A1C
Hgb A1c MFr Bld: 6.1 % — ABNORMAL HIGH (ref 4.8–5.6)
Mean Plasma Glucose: 128.37 mg/dL

## 2019-05-28 LAB — APTT: aPTT: 29 seconds (ref 24–36)

## 2019-05-28 LAB — SURGICAL PCR SCREEN
MRSA, PCR: NEGATIVE
Staphylococcus aureus: POSITIVE — AB

## 2019-05-28 NOTE — Progress Notes (Signed)
[address]       [name] [address]             [phone]     CARDIOTHORACIC SURGERY OFFICE NOTE  Referring Provider is [name], MD PCP is [name], MD   HPI:  Patient is a 53 year old male who returns the office today for follow-up of mitral valve prolapse with severe primary mitral regurgitation with tentative plans to proceed with minimally invasive mitral valve repair later this week.  He was originally seen in consultation on May 14, 2019.  He reports no new problems or complaints.  He specifically denies any recent development of cough, fevers, or worsening shortness of breath.  He has not been exposed to any persons with known or suspected COVID-19 infection.   Current Outpatient Medications  Medication Sig Dispense Refill   albuterol (PROAIR HFA) 108 (90 BASE) MCG/ACT inhaler Inhale 2 puffs into the lungs every 6 (six) hours as needed for wheezing or shortness of breath. 1 Inhaler prn   amLODipine (NORVASC) 5 MG tablet Take 1 tablet (5 mg total) by mouth daily. Please call and schedule an appointment for further refills 1st attempt 90 tablet 0   aspirin EC 81 MG tablet Take 81 mg by mouth daily.     cetirizine (ZYRTEC) 10 MG tablet Take 10 mg by mouth daily.     chlorhexidine (PERIDEX) 0.12 % solution Rinse with 15 mls twice daily for 30 seconds. Use after breakfast and at bedtime. Spit out excess. Do not swallow. (Patient taking differently: Use as directed 15 mLs in the mouth or throat 2 (two) times daily. Rinse with 15 mls twice daily for 30 seconds. Use after breakfast and at bedtime. Spit out excess. Do not swallow.) 480 mL prn   fenofibrate 160 MG tablet Take 160 mg by mouth daily.      fluticasone (CUTIVATE) 0.05 % cream Apply 1 application topically every evening.      fluticasone (FLONASE) 50 MCG/ACT nasal spray Place 1 spray into both nostrils every evening.      mometasone-formoterol (DULERA) 100-5 MCG/ACT AERO Inhale 1  puff into the lungs 2 (two) times daily.     Multiple Vitamin (MULTIVITAMIN WITH MINERALS) TABS tablet Take 1 tablet by mouth daily.     nitroGLYCERIN (NITROSTAT) 0.4 MG SL tablet Place 1 tablet (0.4 mg total) under the tongue every 5 (five) minutes as needed for chest pain. Up to 3 doses maximum 25 tablet 3   rosuvastatin (CRESTOR) 5 MG tablet Take 1 tablet (5 mg total) by mouth daily. 90 tablet 3   No current facility-administered medications for this visit.       Physical Exam:   BP (!) 147/89 (BP Location: Right Arm, Patient Position: Sitting, Cuff Size: Large)    Pulse 93    Temp 97.9 F (36.6 C) (Skin)    Resp 16    Ht 6\' 1"  (1.854 m)    Wt 215 lb (97.5 kg)    SpO2 95% Comment: RA   BMI 28.37 kg/m   General:  Well-appearing  Chest:   Clear to auscultation  CV:   Regular rate and rhythm with prominent holosystolic murmur  Incisions:  n/a  Abdomen:  Soft nontender  Extremities:  Warm and well-perfused  Diagnostic Tests:  CT ANGIOGRAPHY CHEST, ABDOMEN AND PELVIS  TECHNIQUE: Multidetector CT imaging through the chest, abdomen and pelvis was performed using the standard protocol during bolus administration of intravenous contrast.  Multiplanar reconstructed images and MIPs were obtained and reviewed to evaluate the vascular anatomy.  CONTRAST:  75mL ISOVUE-370 IOPAMIDOL (ISOVUE-370) INJECTION 76%  COMPARISON:  None.  FINDINGS: CTA CHEST FINDINGS  Cardiovascular: Extensive coronary artery calcifications. Significant motion artifact involving the ascending thoracic aorta but no evidence to suggest dissection or intramural hematoma. Negative for an aortic aneurysm. Great vessels are patent. Incidentally, the left vertebral artery originates directly from the aorta just proximal to the left subclavian artery. No significant atherosclerotic disease or plaque in the thoracic aorta. No significant pericardial fluid.  Mediastinum/Nodes: Esophagus is unremarkable. Left  hilar tissue is mildly prominent measuring up to 1.1 cm on sequence 9, images 87 and 91. Small mediastinal lymph nodes. Probable thyroid nodule involving the posterior right thyroid lobe measuring roughly 2.2 cm.  Lungs/Pleura: Trachea and mainstem bronchi are patent. No large pleural effusions. There are scattered areas of enlarged branching structures in both lungs. Findings are compatible with multiple foci of mucoid impaction, well demonstrated in the left upper lobe on sequence 7, image 8 image 84. Focal pleural thickening or nodularity along the left major fissure on sequence 7, image 99 measuring roughly 5 mm. No significant airspace disease or consolidation in the lungs. Small pleural-based nodular density in the left lower lobe on sequence 7, image 98.  Musculoskeletal: No acute abnormality.  Review of the MIP images confirms the above findings.  CTA ABDOMEN AND PELVIS FINDINGS  VASCULAR  Aorta: Small amount of wall calcifications in the infrarenal abdominal aorta without aneurysm. Negative for an aortic dissection.  Celiac: Narrowing of the proximal celiac artery likely related to median arcuate ligament compression due to the lack of atherosclerotic disease in this area. Left gastric artery, splenic artery and common hepatic artery are patent.  SMA: Patent without evidence of aneurysm, dissection, vasculitis or significant stenosis.  Renals: Bilateral renal arteries are patent without evidence of aneurysm, dissection, vasculitis, fibromuscular dysplasia or significant stenosis. Two right renal arteries. Single left renal artery.  IMA: IMA is patent.  Inflow: No significant atherosclerotic disease or stenosis in the common or external iliac arteries. Mild atherosclerotic disease and internal iliac arteries bilaterally. Proximal femoral arteries are patent without significant stenosis.  Veins: No obvious venous abnormality within the limitations of  this arterial phase study.  Review of the MIP images confirms the above findings.  NON-VASCULAR  Hepatobiliary: No gross abnormality to the liver on this arterial phase of imaging. Gallbladder is decompressed. No biliary dilatation.  Pancreas: Unremarkable. No pancreatic ductal dilatation or surrounding inflammatory changes.  Spleen: Normal in size without focal abnormality.  Adrenals/Urinary Tract: Normal adrenal glands. Urinary bladder is mildly distended. No hydronephrosis. No suspicious renal lesions. Tiny hypodensity in left kidney lower pole is too small to definitively characterize.  Stomach/Bowel: Stomach is within normal limits. Appendix appears normal. No evidence of bowel wall thickening, distention, or inflammatory changes. Moderate amount of stool in the colon.  Lymphatic: No abdominopelvic lymphadenopathy. Small lymph nodes in the periaortic retroperitoneum.  Reproductive: Prostate is unremarkable.  Other: No ascites. Small umbilical hernia containing fat. Negative for free air.  Musculoskeletal: Disc space narrowing at L5-S1. Facet arthropathy L4-L5.  Review of the MIP images confirms the above findings.  IMPRESSION: 1. Minimal atherosclerotic disease in the aorta. No aortoiliac occlusive disease. Negative for an aortic aneurysm or dissection. 2. Multiple foci of mucoid impaction in both lungs. Reportedly, the patient has a history of asthmatic bronchitis and this pattern disease can be associated with asthma and allergic bronchopulmonary aspergillosis (  ABPA). Prominent hilar lymph nodes may be reactive. 3. Extensive coronary artery calcifications. Aortic Atherosclerosis (ICD10-I70.0). 4. Small pleural-based nodules. No follow-up needed if patient is low-risk (and has no known or suspected primary neoplasm). Non-contrast chest CT can be considered in 12 months if patient is high-risk. This recommendation follows the consensus  statement: Guidelines for Management of Incidental Pulmonary Nodules Detected on CT Images: From the Fleischner Society 2017; Radiology 2017; 284:228-243. 5. Probable right thyroid nodule measuring up to 2 cm. Recommend non-emergent thyroid ultrasound. 6. Narrowing of the celiac artery trunk probably secondary to median arcuate ligament compression.   Electronically Signed   By: Richarda Overlie M.D.   On: 05/22/2019 11:23    Impression:  Patient has mitral valve prolapse with stage D severe symptomatic primary mitral regurgitation.  He describes recent progression of symptoms of exertional shortness of breath and fatigue consistent with chronic diastolic congestive heart failure, New York Heart Association functional class II.  He has not been having any associated exertional chest pain or chest tightness.  I have personally reviewed the patient's recent echocardiograms and diagnostic cardiac catheterization.  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  There are multiple elongated and possible ruptured primary chordae tendinae.  The jet of regurgitation is quite eccentric.  Left ventricular size and systolic function remain normal.  Catheterization reveals multivessel coronary artery disease but no significant flow-limiting plaque other than 70 to 80% ostial stenosis of a small to medium-sized first diagonal branch off of the left anterior descending coronary artery.  Right heart pressures were normal.  I agree the patient would likely benefit from elective mitral valve repair.  I do not feel that the patient's coronary artery disease is severe enough to warrant concomitant coronary artery bypass grafting.  CT angiography reveals no contraindication to peripheral cannulation for surgery.  The patient was noted to have a small benign-appearing nodule in the thyroid gland of indeterminate  risk.  Plan:  The patient was again counseled at length regarding the indications, risks and potential benefits of mitral valve repair.  The rationale for elective surgery has been explained, including a comparison between surgery and continued medical therapy with close follow-up.  The likelihood of successful and durable mitral valve repair has been discussed with particular reference to the findings of their recent echocardiogram.  Based upon these findings and previous experience, I have quoted them a greater than 98 percent likelihood of successful valve repair with less than 1 percent risk of mortality or major morbidity.  Alternative surgical approaches have been discussed including a comparison between conventional sternotomy and minimally-invasive techniques.  The relative risks and benefits of each have been reviewed as they pertain to the patient's specific circumstances, and expectations for the patient's postoperative convalescence has been discussed.  The patient desires to proceed with surgery as previously planned.    The patient understands and accepts all potential risks of surgery including but not limited to risk of death, stroke or other neurologic complication, myocardial infarction, congestive heart failure, respiratory failure, renal failure, bleeding requiring transfusion and/or reexploration, arrhythmia, infection or other wound complications, pneumonia, pleural and/or pericardial effusion, pulmonary embolus, aortic dissection or other major vascular complication, or delayed complications related to valve repair or replacement including but not limited to structural valve deterioration and failure, thrombosis, embolization, endocarditis, or paravalvular leak.  Specific risks potentially related to the minimally-invasive approach were discussed at length, including but not  limited to risk of conversion to full or partial sternotomy, aortic dissection or other major vascular  complication, unilateral acute lung injury or pulmonary edema, phrenic nerve dysfunction or paralysis, rib fracture, chronic pain, lung hernia, or lymphocele. All of their questions have been answered.    I spent in excess of 15 minutes during the conduct of this office consultation and >50% of this time involved direct face-to-face encounter with the patient for counseling and/or coordination of their care.    Salvatore Decentlarence H. Cornelius Moraswen, MD 05/28/2019 11:02 AM

## 2019-05-28 NOTE — Progress Notes (Signed)
PCP - [name]  Cardiologist - Verdis Prime  Chest x-ray - 05/28/19 EKG - 05/28/19 Stress Test - 05/17/16 ECHO - 02/13/19 Cardiac Cath - 03/14/19  Aspirin Instructions: continue but do not take the morning of surgery  Anesthesia review: yes, hx CAD  Patient denies shortness of breath, fever, cough and chest pain at PAT appointment   Patient verbalized understanding of instructions that were given to them at the PAT appointment. Patient was also instructed that they will need to review over the PAT instructions again at home before surgery.

## 2019-05-28 NOTE — Progress Notes (Signed)
VASCULAR LAB PRELIMINARY  PRELIMINARY  PRELIMINARY  PRELIMINARY  Pre CABG Dopplers completed.    Preliminary report:  See CV proc for preliminary results.   Warda Mcqueary, RVT 05/28/2019, 12:41 PM

## 2019-05-30 LAB — NOVEL CORONAVIRUS, NAA (HOSP ORDER, SEND-OUT TO REF LAB; TAT 18-24 HRS): SARS-CoV-2, NAA: NOT DETECTED

## 2019-05-30 MED ORDER — POTASSIUM CHLORIDE 2 MEQ/ML IV SOLN
80.0000 meq | INTRAVENOUS | Status: DC
  Filled 2019-05-30: qty 40

## 2019-05-30 MED ORDER — VANCOMYCIN HCL 1000 MG IV SOLR
INTRAVENOUS | Status: DC
  Filled 2019-05-30: qty 1000

## 2019-05-30 MED ORDER — VANCOMYCIN HCL 10 G IV SOLR
1500.0000 mg | INTRAVENOUS | Status: AC
  Administered 2019-05-31: 1500 mg via INTRAVENOUS
  Filled 2019-05-30: qty 1500

## 2019-05-30 MED ORDER — EPINEPHRINE PF 1 MG/ML IJ SOLN
0.0000 ug/min | INTRAVENOUS | Status: DC
  Filled 2019-05-30: qty 4

## 2019-05-30 MED ORDER — DOPAMINE-DEXTROSE 3.2-5 MG/ML-% IV SOLN
0.0000 ug/kg/min | INTRAVENOUS | Status: DC
  Filled 2019-05-30: qty 250

## 2019-05-30 MED ORDER — MILRINONE LACTATE IN DEXTROSE 20-5 MG/100ML-% IV SOLN
0.3000 ug/kg/min | INTRAVENOUS | Status: DC
  Filled 2019-05-30: qty 100

## 2019-05-30 MED ORDER — SODIUM CHLORIDE 0.9 % IV SOLN
INTRAVENOUS | Status: DC
  Filled 2019-05-30: qty 30

## 2019-05-30 MED ORDER — KENNESTONE BLOOD CARDIOPLEGIA (KBC) MANNITOL SYRINGE (20%, 32ML)
32.0000 mL | INTRAVENOUS | Status: DC
  Filled 2019-05-30 (×2): qty 1

## 2019-05-30 MED ORDER — KENNESTONE BLOOD CARDIOPLEGIA VIAL
13.0000 mL | Status: DC
  Filled 2019-05-30 (×2): qty 1

## 2019-05-30 MED ORDER — INSULIN REGULAR(HUMAN) IN NACL 100-0.9 UT/100ML-% IV SOLN
INTRAVENOUS | Status: DC
  Filled 2019-05-30: qty 100

## 2019-05-30 MED ORDER — SODIUM CHLORIDE 0.9 % IV SOLN
750.0000 mg | INTRAVENOUS | Status: DC
  Filled 2019-05-30: qty 750

## 2019-05-30 MED ORDER — PLASMA-LYTE 148 IV SOLN
INTRAVENOUS | Status: DC
  Filled 2019-05-30: qty 2.5

## 2019-05-30 MED ORDER — TRANEXAMIC ACID (OHS) BOLUS VIA INFUSION
15.0000 mg/kg | INTRAVENOUS | Status: AC
  Administered 2019-05-31: 1488 mg via INTRAVENOUS
  Filled 2019-05-30: qty 1488

## 2019-05-30 MED ORDER — DEXMEDETOMIDINE HCL IN NACL 400 MCG/100ML IV SOLN
0.1000 ug/kg/h | INTRAVENOUS | Status: DC
  Filled 2019-05-30: qty 100

## 2019-05-30 MED ORDER — SODIUM CHLORIDE 0.9 % IV SOLN
1.5000 g | INTRAVENOUS | Status: AC
  Administered 2019-05-31: 1.5 g via INTRAVENOUS
  Administered 2019-05-31: .75 g via INTRAVENOUS
  Filled 2019-05-30: qty 1.5

## 2019-05-30 MED ORDER — NOREPINEPHRINE BITARTRATE 1 MG/ML IV SOLN
0.0000 ug/min | INTRAVENOUS | Status: DC
  Filled 2019-05-30: qty 4

## 2019-05-30 MED ORDER — GLUTARALDEHYDE 0.625% SOAKING SOLUTION
TOPICAL | Status: DC
  Filled 2019-05-30: qty 50

## 2019-05-30 MED ORDER — MAGNESIUM SULFATE 50 % IJ SOLN
40.0000 meq | INTRAMUSCULAR | Status: DC
  Filled 2019-05-30: qty 9.85

## 2019-05-30 MED ORDER — TRANEXAMIC ACID 1000 MG/10ML IV SOLN
1.5000 mg/kg/h | INTRAVENOUS | Status: AC
  Administered 2019-05-31: 1.5 mg/kg/h via INTRAVENOUS
  Filled 2019-05-30: qty 25

## 2019-05-30 MED ORDER — TRANEXAMIC ACID (OHS) PUMP PRIME SOLUTION
2.0000 mg/kg | INTRAVENOUS | Status: DC
  Filled 2019-05-30: qty 1.98

## 2019-05-30 MED ORDER — NITROGLYCERIN IN D5W 200-5 MCG/ML-% IV SOLN
2.0000 ug/min | INTRAVENOUS | Status: DC
  Filled 2019-05-30: qty 250

## 2019-05-30 MED ORDER — PHENYLEPHRINE HCL-NACL 20-0.9 MG/250ML-% IV SOLN
30.0000 ug/min | INTRAVENOUS | Status: DC
  Filled 2019-05-30: qty 250

## 2019-05-30 NOTE — H&P (Signed)
[address]       [place] [id number]             [phone]          CARDIOTHORACIC SURGERY HISTORY AND PHYSICAL EXAM  Referring Provider is Lyn RecordsSmith, Henry W, MD PCP is Jerl MinaHedrick, James, MD      Chief Complaint  Patient presents with   Mitral Regurgitation    severe MR, eval for Mini MVR    HPI:  Patient has a 53 year old male with history of mitral valve prolapse and mitral regurgitation, coronary artery disease status post acute non-ST segment elevation myocardial infarction in 2015 treated with PCI and stenting of the diagonal branch, chronic sinusitis, and hyperlipidemia who has been referred for surgical consultation to discuss treatment options for management of severe symptomatic primary mitral regurgitation.  Patient states that he was first noted to have a heart murmur on physical exam by his primary care physician more than 5 years ago.  He has been followed regularly ever since.  In 2015 he presented with chest discomfort and a near syncopal episode and ruled in for non-ST segment elevation myocardial infarction.  He was found to have high-grade stenosis of the diagonal branch of left anterior descending coronary artery and underwent PCI and stenting.  He was noted to have 50 to 60% stenosis of the right coronary artery and otherwise mild nonobstructive disease.  He has been followed regularly ever since by Dr. Katrinka BlazingSmith.  He states that he has done well clinically until this past December when he noted a relatively sudden change in his exercise tolerance with the development of new onset exertional shortness of breath.  Routine follow-up echocardiogram performed February 13, 2019 revealed normal left ventricular function.  There was mitral valve prolapse with what was felt to be moderate mitral regurgitation.  Because of worsening symptoms the patient underwent TEE on March 05, 2019.  This revealed mitral valve prolapse with severe mitral regurgitation.   The jet of regurgitation was quite eccentric but the PISA radius measures 0.97 cm corresponding to ER O of 0.33 cm and regurgitant volume calculated 69.6 mL.  Patient subsequently underwent diagnostic cardiac catheterization by Dr. Katrinka BlazingSmith.  The stent in the diagonal branch remained widely patent although there was 70-80 ostial stenosis of the diagonal branch.  There remained what was felt to be nonobstructive coronary artery disease in the remainder of the coronary arteries.  Right heart pressures were normal.  Large V waves were not seen on wedge tracing.  [place].  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  He states that he had remained fairly active physically until this past December when he began to experience worsening shortness of breath.  He now gets short of breath with moderate level exertion such as walking up a flight of stairs.  He denies resting shortness of breath, PND, orthopnea, or lower extremity edema.  He has not had any chest pain either with activity or at rest.  He has not had any dizzy spells or syncope.  Patient is a 53 year old male who returns the office today for follow-up of mitral valve prolapse with severe primary mitral regurgitation with tentative plans to proceed with minimally invasive mitral valve repair later this week.  He was originally seen in consultation on May 14, 2019.  He reports no new problems or complaints.  He specifically  denies any recent development of cough, fevers, or worsening shortness of breath.  He has not been exposed to any persons with known or suspected COVID-19 infection.  Past Medical History:  Diagnosis Date   ACS (acute coronary syndrome) with NSTEMI 03/25/2014   Acute sinus infection 03/27/2014   Asthma    Asthmatic bronchitis    CAD (coronary artery disease), native coronary artery, DES placed to 1st diag. 03/26/14;  residual disease to LAD with FFR to 0.62 and RCA of 50-60%- plan for PCI to LAD 03/27/2014   Coronary artery disease    Dyslipidemia, goal LDL below 70 03/27/2014   GI bleed 03/27/2014   Hyperlipemia    Hypertension    Norvasc   MVP (mitral valve prolapse)    Dr [name]   Non Q wave myocardial infarction (HCC) 03/23/2014   Psoriasis    Rhinitis     Past Surgical History:  Procedure Laterality Date   CORONARY ANGIOPLASTY WITH STENT PLACEMENT  03/25/2014; 04/16/2014   "1 + 1"   FLEXIBLE SIGMOIDOSCOPY Left 03/28/2014   Procedure: FLEXIBLE SIGMOIDOSCOPY;  Surgeon: [name], MD;  Location: [facility] ENDOSCOPY;  Service: Endoscopy;  Laterality: Left;   LEFT HEART CATHETERIZATION WITH CORONARY ANGIOGRAM N/A 03/26/2014   Procedure: LEFT HEART CATHETERIZATION WITH CORONARY ANGIOGRAM;  Surgeon: [name], MD;  Location: [facility] CATH LAB;  Service: Cardiovascular;  Laterality: N/A;   nasal polyps     PERCUTANEOUS CORONARY STENT INTERVENTION (PCI-S) N/A 04/16/2014   Procedure: PERCUTANEOUS CORONARY STENT INTERVENTION (PCI-S);  Surgeon: [name], MD;  Location: [facility] CATH LAB;  Service: Cardiovascular;  Laterality: N/A;   RIGHT/LEFT HEART CATH AND CORONARY ANGIOGRAPHY N/A 03/14/2019   Procedure: RIGHT/LEFT HEART CATH AND CORONARY ANGIOGRAPHY;  Surgeon: [name], MD;  Location: [facility] INVASIVE CV LAB;  Service: Cardiovascular;  Laterality: N/A;   TEE WITHOUT CARDIOVERSION N/A 03/05/2019   Procedure: TRANSESOPHAGEAL ECHOCARDIOGRAM (TEE);  Surgeon: [name], MD;  [facility] END[facility]COPY;  Service: Cardiovascular;  Laterality: N/A;   WISDOM TOOTH EXTRACTION      Family History  Problem Relation Age of Onset   Heart disease Mother        deceased age 22   Emphysema Maternal Grandmother        non smoker   Asthma Maternal Grandmother    Hypertension Maternal Grandmother    Prostate cancer Paternal Grandfather    Heart disease Maternal Uncle        valve replaced; deceased  age 49   Stroke Maternal Uncle    Heart attack Neg Hx     Social History Social History   Tobacco Use   Smoking status: Never Smoker   Smokeless tobacco: Never Used  Substance Use Topics   Alcohol use: No   Drug use: No    Prior to Admission medications   Medication Sig Start Date End Date Taking? Authorizing Provider  albuterol (PROAIR HFA) 108 (90 BASE) MCG/ACT inhaler Inhale 2 puffs into the lungs every 6 (six) hours as needed for wheezing or shortness of breath. 02/21/12  Yes Young, Clinton D, MD  amLODipine (NORVASC) 5 MG tablet Take 1 tablet (5 mg total) by mouth daily. Please call and schedule an appointment for further refills 1st attempt 12/25/18  Yes [name], MD  aspirin EC 81 MG tablet Take 81 mg by mouth daily.   Yes [provider]  cetirizine (ZYRTEC) 10 MG tablet Take 10 mg by mouth daily.   Yes [provider]  chlorhexidine (PERIDEX) 0.12 % solution Rinse with 15 mls twice daily for 30 seconds. Use after breakfast and at bedtime. Spit out excess. Do not swallow. Patient taking differently: Use as directed 15 mLs in the mouth or throat 2 (two) times daily. Rinse with 15 mls twice daily for 30 seconds. Use after breakfast and at bedtime. Spit out excess. Do not swallow. 05/16/19 11/15/19 Yes Charlynne Pander, DDS  fenofibrate 160 MG tablet Take 160 mg by mouth daily.  12/09/11  Yes [provider]  fluticasone (CUTIVATE) 0.05 % cream Apply 1 application topically every evening.    Yes [provider]  fluticasone (FLONASE) 50 MCG/ACT nasal spray Place 1 spray into both nostrils every evening.  04/28/16  Yes [provider]  mometasone-formoterol (DULERA) 100-5 MCG/ACT AERO Inhale 1 puff into the lungs 2 (two) times daily. 05/26/16  Yes [provider]  Multiple Vitamin (MULTIVITAMIN WITH MINERALS) TABS tablet Take 1 tablet by mouth daily.   Yes [provider]  nitroGLYCERIN (NITROSTAT) 0.4 MG SL  tablet Place 1 tablet (0.4 mg total) under the tongue every 5 (five) minutes as needed for chest pain. Up to 3 doses maximum 08/06/16  Yes [name], MD  rosuvastatin (CRESTOR) 5 MG tablet Take 1 tablet (5 mg total) by mouth daily. 02/13/19  Yes [name], MD    Allergies  Allergen Reactions   Brilinta [Ticagrelor] Other (See Comments)    GI bleed   Daliresp [Roflumilast] Other (See Comments)    GI UPSET      Review of Systems:              General:                      normal appetite, decreased energy, no weight gain, no weight loss, no fever             Cardiac:                       no chest pain with exertion, no chest pain at rest, + SOB with exertion, no resting SOB, no PND, no orthopnea, no palpitations, no arrhythmia, no atrial fibrillation, no LE edema, no dizzy spells, no syncope             Respiratory:                 + exertional shortness of breath, no home oxygen, + occasional productive cough, + chronic dry cough, no bronchitis, + wheezing, no hemoptysis, no asthma, no pain with inspiration or cough, no sleep apnea, no CPAP at night             GI:                               no difficulty swallowing, no reflux, no frequent heartburn, no hiatal hernia, no abdominal pain, no constipation, no diarrhea, no hematochezia, no hematemesis, no melena             GU:                              no dysuria,  no frequency, no urinary tract infection, no hematuria, no enlarged prostate, no kidney stones, no kidney disease             Vascular:  no pain suggestive of claudication, no pain in feet, no leg cramps, no varicose veins, no DVT, no non-healing foot ulcer             Neuro:                         no stroke, no TIA's, no seizures, no headaches, no temporary blindness one eye,  no slurred speech, no peripheral neuropathy, no chronic pain, no instability of gait, no memory/cognitive dysfunction             Musculoskeletal:         no arthritis, no  joint swelling, no myalgias, no difficulty walking, normal mobility              Skin:                            no rash, no itching, no skin infections, no pressure sores or ulcerations             Psych:                         no anxiety, no depression, no nervousness, no unusual recent stress             Eyes:                           no blurry vision, no floaters, no recent vision changes, + wears glasses or contacts             ENT:                            no hearing loss, no loose or painful teeth, no dentures, last saw dentist > 5 years ago             Hematologic:               no easy bruising, no abnormal bleeding, no clotting disorder, no frequent epistaxis             Endocrine:                   no diabetes, does not check CBG's at home                           Physical Exam:              BP (!) 156/97    Pulse 88    Temp 98.1 F (36.7 C)    Resp 20    Ht 6\' 1"  (1.854 m)    Wt 224 lb (101.6 kg)    SpO2 95% Comment: on RA   BMI 29.55 kg/m              General:                      Mildly obese,  well-appearing             HEENT:                       Unremarkable              Neck:  no JVD, no bruits, no adenopathy              Chest:                          clear to auscultation, symmetrical breath sounds, no wheezes, no rhonchi              CV:                              RRR, grade IV/VI holosystolic murmur              Abdomen:                    soft, non-tender, no masses              Extremities:                 warm, well-perfused, pulses diminished but palpable, no LE edema             Rectal/GU                   Deferred             Neuro:                         Grossly non-focal and symmetrical throughout             Skin:                            Clean and dry, no rashes, no breakdown   Diagnostic Tests:  ECHOCARDIOGRAM REPORT     Patient Name: CHAUNCEY SCIULLI Date of Exam: 02/13/2019 Medical Rec #: [id number]  Height: 73.0 in Accession #: [id number] Weight: 226.8 lb Date of Birth: 04-Jun-1966 BSA: 2.27 m Patient Age: 64 years BP: 146/86 mmHg Patient Gender: M HR: 64 bpm. Exam Location: [address]   Procedure: 2D Echo, Cardiac Doppler and Color Doppler  Indications: R06.02 Shortness of Breath I34.0 Mitral Valve Insufficiency I05.9 Mitral Valve Disorder  History: Patient has prior history of Echocardiogram examinations, most recent 05/13/2017. Previous Myocardial Infarction and CAD, Mitral Valve Prolapse and Mitral Valve Disease; Risk Factors: Dyslipidemia and Family History of Coronary Artery Disease. Asthma.  Sonographer: Farrel Conners RDCS Referring Phys: [phone] [facility]ien[facility]  IMPRESSIONS   1. The left ventricle has hyperdynamic systolic function, with an ejection fraction of >65%. The cavity size was normal. There is mildly increased left ventricular wall thickness. Left ventricular diastolic parameters were normal. 2. The right ventricle has normal systolic function. The cavity was normal. There is no increase in right ventricular wall thickness. 3. Left atrial size was moderately dilated. 4. Severe mitral valve prolapse. 5. The mitral valve is myxomatous. Mitral valve regurgitation is mild to moderate by color flow Doppler. Severe holosystolic prolaspe of the posterior leaflet is noted. 6. The interatrial septum was not well visualized. 7. The aortic valve is tricuspid. 8. The ascending aorta and aortic root are normal in size and structure. 9. The tricuspid valve is normal in structure. 10. When compared to the prior study: Compared to a prior study in 2018, the degree of MR does not appear worse however, the posterior leaflet prolapses significantly. There is now moderate  LAE.  SUMMARY  LVEF 65-70%, mild LVH, normal  wall motion, moderate LAE, severe holosystolic prolapse of the posterior mitral leaflet with mild to moderate regurgitation. Consider TEE evaluation as the eccentric MR jet may underestimate the severity of regurgitation. FINDINGS Left Ventricle: The left ventricle has hyperdynamic systolic function, with an ejection fraction of >65%. The cavity size was normal. There is mildly increased left ventricular wall thickness. Left ventricular diastolic parameters were normal Normal  left ventricular filling pressures Right Ventricle: The right ventricle has normal systolic function. The cavity was normal. There is no increase in right ventricular wall thickness.    Left Atrium: left atrial size was moderately dilated Right Atrium: right atrial size was normal in size.  Interatrial Septum: The interatrial septum was not well visualized. Pericardium: There is no evidence of pericardial effusion. Mitral Valve: The mitral valve is myxomatous. Mitral valve regurgitation is mild to moderate by color flow Doppler. There is severe holosystolic prolapse of of the posterior mitral leaflet of the mitral valve. Tricuspid Valve: The tricuspid valve is normal in structure. Tricuspid valve regurgitation is trivial by color flow Doppler. Aortic Valve: The aortic valve is tricuspid Aortic valve regurgitation was not visualized by color flow Doppler. There is no evidence of aortic valve stenosis. Late systolic prolaspe of the posterior mitral leaflet. Pulmonic Valve: The pulmonic valve was not well visualized. Pulmonic valve regurgitation is not visualized by color flow Doppler. Aorta: The ascending aorta and aortic root are normal in size and structure. In comparison to the previous echocardiogram(s): Compared to a prior study in 2018, the degree of MR does not appear worse however, the posterior leaflet prolapses significantly. There is now moderate  LAE.  LEFT VENTRICLE PLAX 2D (Teich) LV EF: 75.0 % Diastology LVIDd: 5.06 cm LV e' lateral: 12.40 cm/s LVIDs: 2.83 cm LV E/e' lateral: 9.9 LV PW: 1.22 cm LV e' medial: 9.79 cm/s LV IVS: 1.04 cm LV E/e' medial: 12.5 LVOT diam: 2.40 cm LV SV: 91 ml LVOT Area: 4.52 cm  RIGHT VENTRICLE RV S prime: 13.50 cm/s TAPSE (M-mode): 2.2 cm  LEFT ATRIUM Index RIGHT ATRIUM Index LA diam: 5.00 cm 2.20 cm/m RA Area: 18.80 cm LA Vol (A2C): 114.0 ml 50.23 ml/m RA Volume: 54.30 ml 23.92 ml/m LA Vol (A4C): 63.2 ml 27.84 ml/m LA Biplane Vol: 90.7 ml 39.96 ml/m AORTIC VALVE LVOT Vmax: 126.33 cm/s LVOT Vmean: 75.733 cm/s LVOT VTI: 0.260 m  AORTA Ao Root diam: 3.70 cm Ao Asc diam: 3.00 cm  MITRAL VALVE MV Area (PHT): 1.65 cm MV Peak grad: 8.1 mmHg MV Mean grad: 2.0 mmHg MV Vmax: 1.42 m/s MV Vmean: 64.2 cm/s MV VTI: 0.40 m MV PHT: 133 msec MV Decel Time: 234 msec MR Peak grad: 146.9 mmHg MR Mean grad: 82.3 mmHg MR Vmax: 606.00 cm/s MR Vmean: 417.3 cm/s MR PISA: 0.57 MV E velocity: 122.50 cm/s MV A velocity: 93.35 cm/s MV E/A ratio: 1.31   Zoila Shutter MD Electronically signed by Zoila Shutter MD Signature Date/Time: 02/13/2019/1:31:00 PM    TRANSESOPHOGEAL ECHO REPORT     Patient Name: Ginger Organ Date of Exam: 03/05/2019 Medical Rec #: [id number] Height: 73.0 in Accession #: [id number] Weight: 215.0 lb Date of Birth: 1966-09-05 BSA: 2.22 m Patient Age: 53 years BP: 115/67 mmHg Patient Gender: M HR: 72 bpm. Exam Location: Outpatient   Procedure: Transesophageal Echo, 3D Echo, Color Doppler and Cardiac Doppler  Indications: Mitral insufficiency  History: Patient has no prior history  of Echocardiogram examinations and Patient has prior history of Echocardiogram examinations, most recent 02/13/2019. CAD  and Previous Myocardial Infarction, Mitral Valve Prolapse; Risk Factors: Dyslipidemia.  Sonographer: Leta Jungling RDCS Referring Phys: [phone] [facility] Dienes [facility] Diagnosing Phys: [name] MD    PROCEDURE: No evidence of intracardiac thrombus. No evidence of vegetation. Consent was requested emergently by emergency room physicain. After discussion of the risks and benefits of a TEE, an informed consent was obtained from the patient. Local  oropharyngeal anesthetic was provided with viscous lidocaine. Patients was monitored while under deep sedation. Imaged were obtained with the patient in a left lateral decubitus position. Image quality was excellent. The patient's vital signs; including  heart rate, blood pressure, and oxygen saturation; remained stable throughout the procedure. The patient developed no complications during the procedure.  IMPRESSIONS   1. The left ventricle has normal systolic function, with an ejection fraction of 60-65%. The cavity size was normal. Left ventricular diastolic function could not be evaluated. 2. The right ventricle has normal systolc function. The cavity was normal. There is no increase in right ventricular wall thickness. 3. Left atrial size was mildly dilated. 4. Mitral valve regurgitation is severe by color flow Doppler. The MR jet is eccentric medially directed. 5. There is severe mitral valve regurgitation with a regurgitant fraction of 54.60 %. 6. Severe prolapse of the P2 segment of the mitral valve. PISA radius 0.97 cm. ERO 0.33 cm^2. Regurgitant volume 69.6 ml. 7. No stenosis of the aortic valve. 8. The ascending aorta and descending aorta are normal in size and structure.  FINDINGS Left Ventricle: The left ventricle has normal systolic function,  with an ejection fraction of 60-65%. The cavity size was normal. There is no increase in left ventricular wall thickness. Left ventricular diastolic function could not be evaluated. Right Ventricle: The right ventricle has normal systolic function. The cavity was normal. There is no increase in right ventricular wall thickness. Left Atrium: Left atrial size was mildly dilated. Right Atrium: Right atrial size was normal in size. Interatrial Septum: No atrial level shunt detected by color flow Doppler. Pericardium: There is no evidence of pericardial effusion. Mitral Valve: Mitral valve regurgitation is severe by color flow Doppler. The MR jet is eccentric medially directed. Pulmonary venous flow shows systolic flow reversal. There is severe mitral valve regurgitation with a regurgitant fraction of 54.60 %.  Severe prolapse of the P2 segment of the mitral valve. PISA radius 0.97 cm. ERO 0.33 cm^2. Regurgitant volume 69.6 ml. Tricuspid Valve: Tricuspid valve regurgitation was not visualized by color flow Doppler. Aortic Valve: Aortic valve regurgitation was not visualized by color flow Doppler. There is no stenosis of the aortic valve. Pulmonic Valve: No evidence of pulmonic stenosis. Aorta: The ascending aorta are normal in size and structure. Venous: The inferior vena cava is normal in size with greater than 50% respiratory variability.  LEFT VENTRICLE PLAX 2D (Teich) LVOT diam: 2.61 cm LVOT Area: 5.34 cm  AORTIC VALVE LVOT Vmax: 101.00 cm/s LVOT Vmean: 62.300 cm/s LVOT VTI: 0.109 m  MITRAL VALVE MV Peak grad: 7.7 mmHg MV Mean grad: 4.0 mmHg MV Vmax: 1.39 m/s MV Vmean: 91.6 cm/s MV VTI: 0.29 m MR Peak grad: 272.9 mmHg MR Mean grad: 137.0 mmHg MR Vmax: 826.00 cm/s MR Vmean: 548.5 cm/s MR PISA: 5.89 MR Regurgitant Fraction: 54.60 %   [name] MD Electronically signed  by [name] MD Signature Date/Time: 03/05/2019/1:54:28 PM    RIGHT/LEFT HEART CATH AND CORONARY ANGIOGRAPHY  Conclusion    Left main contains up to 20% mid body narrowing.  The LAD contains  luminal irregularities up to 30% of the proximal vessel and a widely patent stent in the proximal to mid vessel beyond the first diagonal. The first diagonal stent is widely patent. Ostial to proximal is eccentric 70 to 80%.  Circumflex coronary artery contains proximal to mid eccentric 50 to 60% narrowing.  RCA contains luminal irregularities up to 40% throughout the proximal to mid vessel. RCA is dominant.  2-3+ mitral regurgitation by ventriculography. Severe mitral regurgitation by transesophageal echo.  Normal left ventricular systolic function with ejection fraction 55%.  Right heart pressures are normal without significant V wave.  RECOMMENDATIONS:   Severe mitral regurgitation by echo in the setting of significant mitral valve prolapse. Patient complains of dyspnea. From hemodynamic and angiographic standpoint mitral regurgitation is is moderate.  Will refer to Dr. Cornelius Moras for consideration of mitral valve repair given symptoms of dyspnea. Symptoms seem out of proportion to hemodynamics but early repair has been shown to be better than after LV enlargement.  Surgeon Notes     03/05/2019 10:44 AM CV Procedure addendum by [name], MD  Procedural Details   Technical Details The right radial area was sterilely prepped and draped. Intravenous sedation with Versed and fentanyl was administered. 1% Xylocaine was infiltrated to achieve local analgesia. Using real-time vascular ultrasound, a double wall stick with an angiocath was utilized to obtain intra-arterial access. A VUS image was saved for the permanent record.The modified Seldinger technique was used to place a 47F " Slender" sheath in the right radial artery. Weight based heparin was administered. Coronary  angiography was done using 5 F catheters. Right coronary angiography was performed with a JR4. Left ventricular hemodymic recordings and angiography was done using the 5 French angled pigtail catheter and lower injection. Left coronary angiography was performed with a JL 3.5 cm.  Right heart catheterization was performed by exchanging a previously placed antecubital IV angio-cath for a 5 French Slender sheath. 1% Xylocaine was used to locally nesthetize the area around the IV site. The IV catheter was wired using an .018 guidewire. The modified Seldinger technique was used to place the 5 [place] sheath. Double glove technique was used to enhance sterility. After sheath insertion, right heart cath was performed using a 5 French balloon tipped catheter and fluoroscopic guidance. Pressures were recorded in each chamber and in the pulmonary capillary wedge position.. The main pulmonary artery O2 saturation was sampled.   Hemostasis was achieved using a pneumatic band.  During this procedure the patient is administered a total of Versed 1.5 mg and Fentanyl 50 mg to achieve and maintain moderate conscious sedation. The patient's heart rate, blood pressure, and oxygen saturation are monitored continuously during the procedure. The period of conscious sedation is 47 minutes, of which I was present face-to-face 100% of this time. Estimated blood loss <50 mL.   During this procedure medications were administered to achieve and maintain moderate conscious sedation while the patient's heart rate, blood pressure, and oxygen saturation were continuously monitored and I was present face-to-face 100% of this time.  Medications  (Filter: Administrations occurring from 03/14/19 0947 to 03/14/19 1109)          Medication Rate/Dose/Volume Action  Date Time   Heparin (Porcine) in NaCl 1000-0.9 UT/500ML-% SOLN (mL) 500 mL Given 03/14/19 0955   Total dose as of 05/14/19 1623 500 mL Given 0955   1,000 mL          fentaNYL (SUBLIMAZE) injection (mcg) 25 mcg Given 03/14/19 1011   Total dose  as of 05/14/19 1623 25 mcg Given 1026   50 mcg        midazolam (VERSED) injection (mg) 1 mg Given 03/14/19 1011   Total dose as of 05/14/19 1623 0.5 mg Given 1026   1.5 mg        lidocaine (PF) (XYLOCAINE) 1 % injection (mL) 2 mL Given 03/14/19 1022   Total dose as of 05/14/19 1623 2 mL Given 1031   4 mL        Radial Cocktail/Verapamil only (mL) 10 mL Given 03/14/19 1033   Total dose as of 05/14/19 1623        10 mL        heparin injection (Units) 5,000 Units Given 03/14/19 1039   Total dose as of 05/14/19 1623        5,000 Units        iohexol (OMNIPAQUE) 350 MG/ML injection (mL) 120 mL Given 03/14/19 1100   Total dose as of 05/14/19 1623        120 mL        Sedation Time   Sedation Time Physician-1: 47 minutes 34 seconds  Coronary Findings   Diagnostic  Dominance: Right  Left Main  Ost LM to Mid LM lesion 20% stenosed  Ost LM to Mid LM lesion is 20% stenosed.  Left Anterior Descending  Ost LAD to Prox LAD lesion 30% stenosed  Ost LAD to Prox LAD lesion is 30% stenosed.  Prox LAD lesion 10% stenosed  Prox LAD lesion is 10% stenosed. The lesion was previously treated.  First Diagonal Branch  Ost 1st Diag lesion 80% stenosed  Ost 1st Diag lesion is 80% stenosed.  Ost 1st Diag to 1st Diag lesion 10% stenosed  Ost 1st Diag to 1st Diag lesion is 10% stenosed. The lesion was previously treated.  Left Circumflex  Vessel is small.  Prox Cx lesion 50% stenosed  Prox Cx lesion is 50% stenosed.  First Obtuse Marginal Branch  Vessel is small in size.  Third Obtuse Marginal Branch  Vessel is large in size.  Right Coronary Artery  Prox RCA to Mid RCA lesion 40% stenosed  Prox RCA to Mid RCA lesion is 40% stenosed.  Intervention   No interventions have been documented.  Right Heart   Right Heart Pressures Hemodynamic findings  consistent with pulmonary hypertension. LV EDP is normal. Mean wedge pressure is 10 mmHg  Wall Motion        Resting       Ventricular ectopy degrades the quality of ventriculography.          Left Heart   Left Ventricle The left ventricular systolic function is normal. LV end diastolic pressure is normal. The left ventricular ejection fraction is 55-65% by visual estimate.  Mitral Valve There is moderate (3+) mitral regurgitation. MR is 2-3+  Coronary Diagrams   Diagnostic  Dominance: Right    Intervention   Implants       No implant documentation for this case.  Syngo Images   Show images for CARDIAC CATHETERIZATION  Images on Long Term Storage   Show images for Walker, Sitar to Procedure Log   Procedure Log    Hemo Data    Most Recent Value  Fick Cardiac Output 6.18 L/min  Fick Cardiac Output Index 2.81 (L/min)/BSA  RA A Wave 6 mmHg  RA V Wave 5 mmHg  RA Mean 3 mmHg  RV Systolic Pressure 30 mmHg  RV Diastolic Pressure 1  mmHg  RV EDP 7 mmHg  PA Systolic Pressure 23 mmHg  PA Diastolic Pressure 5 mmHg  PA Mean 15 mmHg  PW A Wave 15 mmHg  PW V Wave 16 mmHg  PW Mean 10 mmHg  AO Systolic Pressure 120 mmHg  AO Diastolic Pressure 76 mmHg  AO Mean 98 mmHg  LV Systolic Pressure 123 mmHg  LV Diastolic Pressure 4 mmHg  LV EDP 15 mmHg  AOp Systolic Pressure 125 mmHg  AOp Diastolic Pressure 72 mmHg  AOp Mean Pressure 97 mmHg  LVp Systolic Pressure 128 mmHg  LVp Diastolic Pressure 5 mmHg  LVp EDP Pressure 22 mmHg  QP/QS 1  TPVR Index 5.33 HRUI  TSVR Index 34.8 HRUI  PVR SVR Ratio 0.05  TPVR/TSVR Ratio 0.15    CT ANGIOGRAPHY CHEST, ABDOMEN AND PELVIS  TECHNIQUE: Multidetector CT imaging through the chest, abdomen and pelvis was performed using the standard protocol during bolus administration of intravenous contrast. Multiplanar reconstructed images and MIPs were obtained and reviewed to evaluate the vascular  anatomy.  CONTRAST: 37mL ISOVUE-370 IOPAMIDOL (ISOVUE-370) INJECTION 76%  COMPARISON: None.  FINDINGS: CTA CHEST FINDINGS  Cardiovascular: Extensive coronary artery calcifications. Significant motion artifact involving the ascending thoracic aorta but no evidence to suggest dissection or intramural hematoma. Negative for an aortic aneurysm. Great vessels are patent. Incidentally, the left vertebral artery originates directly from the aorta just proximal to the left subclavian artery. No significant atherosclerotic disease or plaque in the thoracic aorta. No significant pericardial fluid.  Mediastinum/Nodes: Esophagus is unremarkable. Left hilar tissue is mildly prominent measuring up to 1.1 cm on sequence 9, images 87 and 91. Small mediastinal lymph nodes. Probable thyroid nodule involving the posterior right thyroid lobe measuring roughly 2.2 cm.  Lungs/Pleura: Trachea and mainstem bronchi are patent. No large pleural effusions. There are scattered areas of enlarged branching structures in both lungs. Findings are compatible with multiple foci of mucoid impaction, well demonstrated in the left upper lobe on sequence 7, image 8 image 84. Focal pleural thickening or nodularity along the left major fissure on sequence 7, image 99 measuring roughly 5 mm. No significant airspace disease or consolidation in the lungs. Small pleural-based nodular density in the left lower lobe on sequence 7, image 98.  Musculoskeletal: No acute abnormality.  Review of the MIP images confirms the above findings.  CTA ABDOMEN AND PELVIS FINDINGS  VASCULAR  Aorta: Small amount of wall calcifications in the infrarenal abdominal aorta without aneurysm. Negative for an aortic dissection.  Celiac: Narrowing of the proximal celiac artery likely related to median arcuate ligament compression due to the lack of atherosclerotic disease in this area. Left gastric artery, splenic artery and  common hepatic artery are patent.  SMA: Patent without evidence of aneurysm, dissection, vasculitis or significant stenosis.  Renals: Bilateral renal arteries are patent without evidence of aneurysm, dissection, vasculitis, fibromuscular dysplasia or significant stenosis. Two right renal arteries. Single left renal artery.  IMA: IMA is patent.  Inflow: No significant atherosclerotic disease or stenosis in the common or external iliac arteries. Mild atherosclerotic disease and internal iliac arteries bilaterally. Proximal femoral arteries are patent without significant stenosis.  Veins: No obvious venous abnormality within the limitations of this arterial phase study.  Review of the MIP images confirms the above findings.  NON-VASCULAR  Hepatobiliary: No gross abnormality to the liver on this arterial phase of imaging. Gallbladder is decompressed. No biliary dilatation.  Pancreas: Unremarkable. No pancreatic ductal dilatation or surrounding inflammatory changes.  Spleen: Normal in  size without focal abnormality.  Adrenals/Urinary Tract: Normal adrenal glands. Urinary bladder is mildly distended. No hydronephrosis. No suspicious renal lesions. Tiny hypodensity in left kidney lower pole is too small to definitively characterize.  Stomach/Bowel: Stomach is within normal limits. Appendix appears normal. No evidence of bowel wall thickening, distention, or inflammatory changes. Moderate amount of stool in the colon.  Lymphatic: No abdominopelvic lymphadenopathy. Small lymph nodes in the periaortic retroperitoneum.  Reproductive: Prostate is unremarkable.  Other: No ascites. Small umbilical hernia containing fat. Negative for free air.  Musculoskeletal: Disc space narrowing at L5-S1. Facet arthropathy L4-L5.  Review of the MIP images confirms the above findings.  IMPRESSION: 1. Minimal atherosclerotic disease in the aorta. No aortoiliac occlusive  disease. Negative for an aortic aneurysm or dissection. 2. Multiple foci of mucoid impaction in both lungs. Reportedly, the patient has a history of asthmatic bronchitis and this pattern disease can be associated with asthma and allergic bronchopulmonary aspergillosis (ABPA). Prominent hilar lymph nodes may be reactive. 3. Extensive coronary artery calcifications. Aortic Atherosclerosis (ICD10-I70.0). 4. Small pleural-based nodules. No follow-up needed if patient is low-risk (and has no known or suspected primary neoplasm). Non-contrast chest CT can be considered in 12 months if patient is high-risk. This recommendation follows the consensus statement: Guidelines for Management of Incidental Pulmonary Nodules Detected on CT Images: From the Fleischner Society 2017; Radiology 2017; 284:228-243. 5. Probable right thyroid nodule measuring up to 2 cm. Recommend non-emergent thyroid ultrasound. 6. Narrowing of the celiac artery trunk probably secondary to median arcuate ligament compression.   Electronically Signed By: Richarda Overlie M.D. On: 05/22/2019 11:23    Impression:  Patient has mitral valve prolapse with stage D severe symptomatic primary mitral regurgitation. He describes recent progression of symptoms of exertional shortness of breath and fatigue consistent with chronic diastolic congestive heart failure, [place] Heart Association functional class II. He has not been having any associated exertional chest pain or chest tightness. I have personally reviewed the patient's recent echocardiograms and diagnostic cardiac catheterization. 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 There are multiple elongated and possible ruptured primary chordae tendinae.The jet of regurgitation is quite eccentric. Left ventricular size and systolic function remain normal.  Catheterization reveals multivessel coronary artery disease but no significant flow-limiting plaque other than 70 to 80% ostial stenosis of a small to medium-sized first diagonal branch off of the left anterior descending coronary artery. Right heart pressures were normal. I agree the patient would likely benefit from elective mitral valve repair. I do not feel that the patient's coronary artery disease is severe enough to warrant concomitant coronary artery bypass grafting.  CT angiography reveals no contraindication to peripheral cannulation for surgery.  The patient was noted to have a small benign-appearing nodule in the thyroid gland of indeterminate risk.  Plan:  The patient was again counseled at length regarding the indications, risks and potential benefits of mitral valve repair.  The rationale for elective surgery has been explained, including a comparison between surgery and continued medical therapy with close follow-up.  The likelihood of successful and durable mitral valve repair has been discussed with particular reference to the findings of their recent echocardiogram.  Based upon these findings and previous experience, I have quoted them a greater than 98 percent likelihood of successful valve repair with less than 1 percent risk of mortality or major morbidity.  Alternative surgical approaches have been discussed including a comparison between conventional  sternotomy and minimally-invasive techniques.  The relative risks and benefits of each have been reviewed as they pertain to the patient's specific circumstances, and expectations for the patient's postoperative convalescence has been discussed.  The patient desires to proceed with surgery as previously planned.    The patient understands and accepts all potential risks of surgery including but not limited to risk of death, stroke or other neurologic complication, myocardial infarction, congestive heart failure, respiratory failure,  renal failure, bleeding requiring transfusion and/or reexploration, arrhythmia, infection or other wound complications, pneumonia, pleural and/or pericardial effusion, pulmonary embolus, aortic dissection or other major vascular complication, or delayed complications related to valve repair or replacement including but not limited to structural valve deterioration and failure, thrombosis, embolization, endocarditis, or paravalvular leak.  Specific risks potentially related to the minimally-invasive approach were discussed at length, including but not limited to risk of conversion to full or partial sternotomy, aortic dissection or other major vascular complication, unilateral acute lung injury or pulmonary edema, phrenic nerve dysfunction or paralysis, rib fracture, chronic pain, lung hernia, or lymphocele. All of their questions have been answered.     Salvatore Decent. Cornelius Moras, MD 05/28/2019 11:02 AM

## 2019-05-31 ENCOUNTER — Inpatient Hospital Stay (HOSPITAL_COMMUNITY): Payer: BC Managed Care – PPO | Admitting: Certified Registered Nurse Anesthetist

## 2019-05-31 ENCOUNTER — Inpatient Hospital Stay (HOSPITAL_COMMUNITY): Payer: BC Managed Care – PPO

## 2019-05-31 ENCOUNTER — Encounter (HOSPITAL_COMMUNITY): Payer: Self-pay | Admitting: *Deleted

## 2019-05-31 ENCOUNTER — Inpatient Hospital Stay (HOSPITAL_COMMUNITY)
Admission: RE | Admit: 2019-05-31 | Discharge: 2019-06-05 | DRG: 220 | Disposition: A | Discharge status: Home / Self Care | Payer: BC Managed Care – PPO | Source: Ambulatory Visit | Attending: Thoracic Surgery (Cardiothoracic Vascular Surgery) | Admitting: Thoracic Surgery (Cardiothoracic Vascular Surgery)

## 2019-05-31 ENCOUNTER — Encounter (HOSPITAL_COMMUNITY)
Admission: RE | Disposition: A | Discharge status: Home / Self Care | Payer: Self-pay | Source: Ambulatory Visit | Attending: Thoracic Surgery (Cardiothoracic Vascular Surgery)

## 2019-05-31 ENCOUNTER — Other Ambulatory Visit: Payer: Self-pay

## 2019-05-31 ENCOUNTER — Inpatient Hospital Stay (HOSPITAL_COMMUNITY): Payer: BC Managed Care – PPO | Admitting: Physician Assistant

## 2019-05-31 DIAGNOSIS — Y838 Other surgical procedures as the cause of abnormal reaction of the patient, or of later complication, without mention of misadventure at the time of the procedure: Secondary | ICD-10-CM | POA: Diagnosis not present

## 2019-05-31 DIAGNOSIS — J181 Lobar pneumonia, unspecified organism: Secondary | ICD-10-CM | POA: Diagnosis not present

## 2019-05-31 DIAGNOSIS — J9589 Other postprocedural complications and disorders of respiratory system, not elsewhere classified: Secondary | ICD-10-CM | POA: Diagnosis not present

## 2019-05-31 DIAGNOSIS — J329 Chronic sinusitis, unspecified: Secondary | ICD-10-CM | POA: Diagnosis not present

## 2019-05-31 DIAGNOSIS — J9811 Atelectasis: Secondary | ICD-10-CM | POA: Diagnosis not present

## 2019-05-31 DIAGNOSIS — Z8249 Family history of ischemic heart disease and other diseases of the circulatory system: Secondary | ICD-10-CM

## 2019-05-31 DIAGNOSIS — Z9889 Other specified postprocedural states: Secondary | ICD-10-CM

## 2019-05-31 DIAGNOSIS — I441 Atrioventricular block, second degree: Secondary | ICD-10-CM | POA: Diagnosis not present

## 2019-05-31 DIAGNOSIS — Z955 Presence of coronary angioplasty implant and graft: Secondary | ICD-10-CM

## 2019-05-31 DIAGNOSIS — I2510 Atherosclerotic heart disease of native coronary artery without angina pectoris: Secondary | ICD-10-CM | POA: Diagnosis not present

## 2019-05-31 DIAGNOSIS — Z7982 Long term (current) use of aspirin: Secondary | ICD-10-CM | POA: Diagnosis not present

## 2019-05-31 DIAGNOSIS — D62 Acute posthemorrhagic anemia: Secondary | ICD-10-CM | POA: Diagnosis not present

## 2019-05-31 DIAGNOSIS — Y92239 Unspecified place in hospital as the place of occurrence of the external cause: Secondary | ICD-10-CM | POA: Diagnosis not present

## 2019-05-31 DIAGNOSIS — Z1159 Encounter for screening for other viral diseases: Secondary | ICD-10-CM | POA: Diagnosis not present

## 2019-05-31 DIAGNOSIS — E785 Hyperlipidemia, unspecified: Secondary | ICD-10-CM | POA: Diagnosis not present

## 2019-05-31 DIAGNOSIS — I340 Nonrheumatic mitral (valve) insufficiency: Secondary | ICD-10-CM | POA: Diagnosis not present

## 2019-05-31 DIAGNOSIS — I10 Essential (primary) hypertension: Secondary | ICD-10-CM | POA: Diagnosis not present

## 2019-05-31 DIAGNOSIS — I083 Combined rheumatic disorders of mitral, aortic and tricuspid valves: Secondary | ICD-10-CM | POA: Diagnosis not present

## 2019-05-31 DIAGNOSIS — Z79899 Other long term (current) drug therapy: Secondary | ICD-10-CM

## 2019-05-31 DIAGNOSIS — Z4682 Encounter for fitting and adjustment of non-vascular catheter: Secondary | ICD-10-CM | POA: Diagnosis not present

## 2019-05-31 DIAGNOSIS — J90 Pleural effusion, not elsewhere classified: Secondary | ICD-10-CM | POA: Diagnosis not present

## 2019-05-31 DIAGNOSIS — I341 Nonrheumatic mitral (valve) prolapse: Secondary | ICD-10-CM | POA: Diagnosis present

## 2019-05-31 DIAGNOSIS — I252 Old myocardial infarction: Secondary | ICD-10-CM | POA: Diagnosis not present

## 2019-05-31 DIAGNOSIS — Z888 Allergy status to other drugs, medicaments and biological substances status: Secondary | ICD-10-CM | POA: Diagnosis not present

## 2019-05-31 HISTORY — PX: MITRAL VALVE REPAIR: SHX2039

## 2019-05-31 HISTORY — DX: Other specified postprocedural states: Z98.890

## 2019-05-31 HISTORY — PX: TEE WITHOUT CARDIOVERSION: SHX5443

## 2019-05-31 LAB — POCT I-STAT 7, (LYTES, BLD GAS, ICA,H+H)
Acid-base deficit: 4 mmol/L — ABNORMAL HIGH (ref 0.0–2.0)
Acid-base deficit: 3 mmol/L — ABNORMAL HIGH (ref 0.0–2.0)
Acid-base deficit: 8 mmol/L — ABNORMAL HIGH (ref 0.0–2.0)
Acid-base deficit: 6 mmol/L — ABNORMAL HIGH (ref 0.0–2.0)
Acid-base deficit: 8 mmol/L — ABNORMAL HIGH (ref 0.0–2.0)
Bicarbonate: 23.6 mmol/L (ref 20.0–28.0)
Bicarbonate: 23.4 mmol/L (ref 20.0–28.0)
Bicarbonate: 19.6 mmol/L — ABNORMAL LOW (ref 20.0–28.0)
Bicarbonate: 17.6 mmol/L — ABNORMAL LOW (ref 20.0–28.0)
Bicarbonate: 19.2 mmol/L — ABNORMAL LOW (ref 20.0–28.0)
Calcium, Ion: 1.26 mmol/L (ref 1.15–1.40)
Calcium, Ion: 1.07 mmol/L — ABNORMAL LOW (ref 1.15–1.40)
Calcium, Ion: 1.19 mmol/L (ref 1.15–1.40)
Calcium, Ion: 1.06 mmol/L — ABNORMAL LOW (ref 1.15–1.40)
Calcium, Ion: 1.07 mmol/L — ABNORMAL LOW (ref 1.15–1.40)
HCT: 37 % — ABNORMAL LOW (ref 39.0–52.0)
HCT: 28 % — ABNORMAL LOW (ref 39.0–52.0)
HCT: 31 % — ABNORMAL LOW (ref 39.0–52.0)
HCT: 27 % — ABNORMAL LOW (ref 39.0–52.0)
HCT: 29 % — ABNORMAL LOW (ref 39.0–52.0)
Hemoglobin: 12.6 g/dL — ABNORMAL LOW (ref 13.0–17.0)
Hemoglobin: 9.5 g/dL — ABNORMAL LOW (ref 13.0–17.0)
Hemoglobin: 10.5 g/dL — ABNORMAL LOW (ref 13.0–17.0)
Hemoglobin: 9.2 g/dL — ABNORMAL LOW (ref 13.0–17.0)
Hemoglobin: 9.9 g/dL — ABNORMAL LOW (ref 13.0–17.0)
O2 Saturation: 100 %
O2 Saturation: 100 %
O2 Saturation: 99 %
O2 Saturation: 94 %
O2 Saturation: 95 %
Patient temperature: 36.1
Patient temperature: 36.6
Potassium: 3.6 mmol/L (ref 3.5–5.1)
Potassium: 4.1 mmol/L (ref 3.5–5.1)
Potassium: 4 mmol/L (ref 3.5–5.1)
Potassium: 3.8 mmol/L (ref 3.5–5.1)
Potassium: 4 mmol/L (ref 3.5–5.1)
Sodium: 141 mmol/L (ref 135–145)
Sodium: 143 mmol/L (ref 135–145)
Sodium: 143 mmol/L (ref 135–145)
Sodium: 144 mmol/L (ref 135–145)
Sodium: 145 mmol/L (ref 135–145)
TCO2: 25 mmol/L (ref 22–32)
TCO2: 25 mmol/L (ref 22–32)
TCO2: 21 mmol/L — ABNORMAL LOW (ref 22–32)
TCO2: 19 mmol/L — ABNORMAL LOW (ref 22–32)
TCO2: 20 mmol/L — ABNORMAL LOW (ref 22–32)
pCO2 arterial: 50.5 mmHg — ABNORMAL HIGH (ref 32.0–48.0)
pCO2 arterial: 45.7 mmHg (ref 32.0–48.0)
pCO2 arterial: 46.9 mmHg (ref 32.0–48.0)
pCO2 arterial: 33.9 mmHg (ref 32.0–48.0)
pCO2 arterial: 35.7 mmHg (ref 32.0–48.0)
pH, Arterial: 7.277 — ABNORMAL LOW (ref 7.350–7.450)
pH, Arterial: 7.318 — ABNORMAL LOW (ref 7.350–7.450)
pH, Arterial: 7.228 — ABNORMAL LOW (ref 7.350–7.450)
pH, Arterial: 7.321 — ABNORMAL LOW (ref 7.350–7.450)
pH, Arterial: 7.334 — ABNORMAL LOW (ref 7.350–7.450)
pO2, Arterial: 369 mmHg — ABNORMAL HIGH (ref 83.0–108.0)
pO2, Arterial: 427 mmHg — ABNORMAL HIGH (ref 83.0–108.0)
pO2, Arterial: 145 mmHg — ABNORMAL HIGH (ref 83.0–108.0)
pO2, Arterial: 72 mmHg — ABNORMAL LOW (ref 83.0–108.0)
pO2, Arterial: 76 mmHg — ABNORMAL LOW (ref 83.0–108.0)

## 2019-05-31 LAB — POCT I-STAT, CHEM 8
BUN: 11 mg/dL (ref 6–20)
Calcium, Ion: 1.03 mmol/L — ABNORMAL LOW (ref 1.15–1.40)
Chloride: 108 mmol/L (ref 98–111)
Creatinine, Ser: 0.7 mg/dL (ref 0.61–1.24)
Glucose, Bld: 121 mg/dL — ABNORMAL HIGH (ref 70–99)
HCT: 32 % — ABNORMAL LOW (ref 39.0–52.0)
Hemoglobin: 10.9 g/dL — ABNORMAL LOW (ref 13.0–17.0)
Potassium: 4 mmol/L (ref 3.5–5.1)
Sodium: 140 mmol/L (ref 135–145)
TCO2: 20 mmol/L — ABNORMAL LOW (ref 22–32)

## 2019-05-31 LAB — POCT I-STAT 4, (NA,K, GLUC, HGB,HCT)
Glucose, Bld: 96 mg/dL (ref 70–99)
Glucose, Bld: 115 mg/dL — ABNORMAL HIGH (ref 70–99)
Glucose, Bld: 120 mg/dL — ABNORMAL HIGH (ref 70–99)
Glucose, Bld: 144 mg/dL — ABNORMAL HIGH (ref 70–99)
Glucose, Bld: 133 mg/dL — ABNORMAL HIGH (ref 70–99)
Glucose, Bld: 138 mg/dL — ABNORMAL HIGH (ref 70–99)
Glucose, Bld: 96 mg/dL (ref 70–99)
HCT: 37 % — ABNORMAL LOW (ref 39.0–52.0)
HCT: 28 % — ABNORMAL LOW (ref 39.0–52.0)
HCT: 34 % — ABNORMAL LOW (ref 39.0–52.0)
HCT: 26 % — ABNORMAL LOW (ref 39.0–52.0)
HCT: 23 % — ABNORMAL LOW (ref 39.0–52.0)
HCT: 29 % — ABNORMAL LOW (ref 39.0–52.0)
HCT: 31 % — ABNORMAL LOW (ref 39.0–52.0)
Hemoglobin: 12.6 g/dL — ABNORMAL LOW (ref 13.0–17.0)
Hemoglobin: 11.6 g/dL — ABNORMAL LOW (ref 13.0–17.0)
Hemoglobin: 9.5 g/dL — ABNORMAL LOW (ref 13.0–17.0)
Hemoglobin: 8.8 g/dL — ABNORMAL LOW (ref 13.0–17.0)
Hemoglobin: 7.8 g/dL — ABNORMAL LOW (ref 13.0–17.0)
Hemoglobin: 9.9 g/dL — ABNORMAL LOW (ref 13.0–17.0)
Hemoglobin: 10.5 g/dL — ABNORMAL LOW (ref 13.0–17.0)
Potassium: 3.6 mmol/L (ref 3.5–5.1)
Potassium: 4 mmol/L (ref 3.5–5.1)
Potassium: 4.4 mmol/L (ref 3.5–5.1)
Potassium: 4.8 mmol/L (ref 3.5–5.1)
Potassium: 5.3 mmol/L — ABNORMAL HIGH (ref 3.5–5.1)
Potassium: 3.9 mmol/L (ref 3.5–5.1)
Potassium: 3.9 mmol/L (ref 3.5–5.1)
Sodium: 142 mmol/L (ref 135–145)
Sodium: 141 mmol/L (ref 135–145)
Sodium: 143 mmol/L (ref 135–145)
Sodium: 140 mmol/L (ref 135–145)
Sodium: 141 mmol/L (ref 135–145)
Sodium: 143 mmol/L (ref 135–145)
Sodium: 144 mmol/L (ref 135–145)

## 2019-05-31 LAB — ECHO INTRAOPERATIVE TEE
Height: 73 in
Weight: 3440 oz

## 2019-05-31 LAB — HEMOGLOBIN AND HEMATOCRIT, BLOOD
HCT: 25.9 % — ABNORMAL LOW (ref 39.0–52.0)
Hemoglobin: 8.6 g/dL — ABNORMAL LOW (ref 13.0–17.0)

## 2019-05-31 LAB — CBC
HCT: 32.5 % — ABNORMAL LOW (ref 39.0–52.0)
HCT: 33.1 % — ABNORMAL LOW (ref 39.0–52.0)
Hemoglobin: 10.8 g/dL — ABNORMAL LOW (ref 13.0–17.0)
Hemoglobin: 11 g/dL — ABNORMAL LOW (ref 13.0–17.0)
MCH: 29.8 pg (ref 26.0–34.0)
MCH: 29.5 pg (ref 26.0–34.0)
MCHC: 33.2 g/dL (ref 30.0–36.0)
MCHC: 33.2 g/dL (ref 30.0–36.0)
MCV: 89.5 fL (ref 80.0–100.0)
MCV: 88.7 fL (ref 80.0–100.0)
Platelets: 164 10*3/uL (ref 150–400)
Platelets: 195 10*3/uL (ref 150–400)
RBC: 3.63 MIL/uL — ABNORMAL LOW (ref 4.22–5.81)
RBC: 3.73 MIL/uL — ABNORMAL LOW (ref 4.22–5.81)
RDW: 13.6 % (ref 11.5–15.5)
RDW: 13.7 % (ref 11.5–15.5)
WBC: 14.9 10*3/uL — ABNORMAL HIGH (ref 4.0–10.5)
WBC: 12.6 10*3/uL — ABNORMAL HIGH (ref 4.0–10.5)
nRBC: 0 % (ref 0.0–0.2)
nRBC: 0 % (ref 0.0–0.2)

## 2019-05-31 LAB — GLUCOSE, CAPILLARY
Glucose-Capillary: 127 mg/dL — ABNORMAL HIGH (ref 70–99)
Glucose-Capillary: 113 mg/dL — ABNORMAL HIGH (ref 70–99)
Glucose-Capillary: 112 mg/dL — ABNORMAL HIGH (ref 70–99)
Glucose-Capillary: 117 mg/dL — ABNORMAL HIGH (ref 70–99)
Glucose-Capillary: 115 mg/dL — ABNORMAL HIGH (ref 70–99)
Glucose-Capillary: 106 mg/dL — ABNORMAL HIGH (ref 70–99)
Glucose-Capillary: 141 mg/dL — ABNORMAL HIGH (ref 70–99)

## 2019-05-31 LAB — POCT ACTIVATED CLOTTING TIME
Activated Clotting Time: 121 seconds
Activated Clotting Time: 98 seconds

## 2019-05-31 LAB — PLATELET COUNT: Platelets: 176 10*3/uL (ref 150–400)

## 2019-05-31 LAB — MAGNESIUM: Magnesium: 2.6 mg/dL — ABNORMAL HIGH (ref 1.7–2.4)

## 2019-05-31 LAB — PROTIME-INR
INR: 1.6 — ABNORMAL HIGH (ref 0.8–1.2)
Prothrombin Time: 18.7 seconds — ABNORMAL HIGH (ref 11.4–15.2)

## 2019-05-31 LAB — APTT: aPTT: 30 seconds (ref 24–36)

## 2019-05-31 LAB — CREATININE, SERUM
Creatinine, Ser: 0.85 mg/dL (ref 0.61–1.24)
GFR calc Af Amer: >60 mL/min (ref >60)
GFR calc non Af Amer: >60 mL/min (ref >60)

## 2019-05-31 SURGERY — REPAIR, MITRAL VALVE, MINIMALLY INVASIVE
Anesthesia: General | Site: Chest | Laterality: Right

## 2019-05-31 MED ORDER — SODIUM CHLORIDE 0.9% FLUSH: 3.0000 mL | INTRAVENOUS | Status: DC | PRN

## 2019-05-31 MED ORDER — VANCOMYCIN HCL IN DEXTROSE 1-5 GM/200ML-% IV SOLN
1000.0000 mg | Freq: Once | INTRAVENOUS | Status: AC
  Administered 2019-05-31: 22:00:00 1000 mg via INTRAVENOUS
  Filled 2019-05-31: qty 200

## 2019-05-31 MED ORDER — BISACODYL 5 MG PO TBEC
10.0000 mg | DELAYED_RELEASE_TABLET | Freq: Every day | ORAL | Status: DC
  Administered 2019-06-01 – 2019-06-02 (×2): 10 mg via ORAL
  Filled 2019-05-31 (×3): qty 2

## 2019-05-31 MED ORDER — ACETAMINOPHEN 160 MG/5ML PO SOLN: 1000.0000 mg | Freq: Four times a day (QID) | ORAL | Status: DC

## 2019-05-31 MED ORDER — PHENYLEPHRINE HCL-NACL 20-0.9 MG/250ML-% IV SOLN: 0.0000 ug/min | INTRAVENOUS | Status: DC

## 2019-05-31 MED ORDER — SODIUM CHLORIDE 0.9 % IV SOLN
INTRAVENOUS | Status: DC | PRN
  Administered 2019-05-31: .7 [IU]/h via INTRAVENOUS

## 2019-05-31 MED ORDER — ROCURONIUM BROMIDE 10 MG/ML (PF) SYRINGE
PREFILLED_SYRINGE | INTRAVENOUS | Status: AC
  Filled 2019-05-31: qty 10

## 2019-05-31 MED ORDER — FAMOTIDINE IN NACL 20-0.9 MG/50ML-% IV SOLN
20.0000 mg | Freq: Two times a day (BID) | INTRAVENOUS | Status: AC
  Administered 2019-05-31: 20 mg via INTRAVENOUS
  Filled 2019-05-31: qty 50

## 2019-05-31 MED ORDER — INSULIN REGULAR(HUMAN) IN NACL 100-0.9 UT/100ML-% IV SOLN: INTRAVENOUS | Status: DC

## 2019-05-31 MED ORDER — PROPOFOL 10 MG/ML IV BOLUS
INTRAVENOUS | Status: DC | PRN
  Administered 2019-05-31: 80 mg via INTRAVENOUS

## 2019-05-31 MED ORDER — SUCCINYLCHOLINE CHLORIDE 200 MG/10ML IV SOSY
PREFILLED_SYRINGE | INTRAVENOUS | Status: AC
  Filled 2019-05-31: qty 10

## 2019-05-31 MED ORDER — DOPAMINE-DEXTROSE 3.2-5 MG/ML-% IV SOLN
INTRAVENOUS | Status: DC | PRN
  Administered 2019-05-31: 3 ug/kg/min via INTRAVENOUS

## 2019-05-31 MED ORDER — LACTATED RINGERS IV SOLN
INTRAVENOUS | Status: DC | PRN
  Administered 2019-05-31: 07:00:00 via INTRAVENOUS

## 2019-05-31 MED ORDER — ACETAMINOPHEN 160 MG/5ML PO SOLN
1000.0000 mg | Freq: Four times a day (QID) | ORAL | Status: DC
  Filled 2019-05-31: qty 40.6

## 2019-05-31 MED ORDER — OXYCODONE HCL 5 MG PO TABS
5.0000 mg | ORAL_TABLET | ORAL | Status: DC | PRN
  Administered 2019-06-01: 10 mg via ORAL
  Filled 2019-05-31: qty 2

## 2019-05-31 MED ORDER — MAGNESIUM SULFATE 4 GM/100ML IV SOLN
4.0000 g | Freq: Once | INTRAVENOUS | Status: AC
  Administered 2019-05-31: 4 g via INTRAVENOUS
  Filled 2019-05-31: qty 100

## 2019-05-31 MED ORDER — DOCUSATE SODIUM 100 MG PO CAPS
200.0000 mg | ORAL_CAPSULE | Freq: Every day | ORAL | Status: DC
  Administered 2019-06-01 – 2019-06-02 (×2): 200 mg via ORAL
  Filled 2019-05-31 (×3): qty 2

## 2019-05-31 MED ORDER — LACTATED RINGERS IV SOLN
INTRAVENOUS | Status: DC
  Administered 2019-05-31: 22:00:00 20 mL/h via INTRAVENOUS

## 2019-05-31 MED ORDER — BUPIVACAINE HCL (PF) 0.5 % IJ SOLN
INTRAMUSCULAR | Status: DC | PRN
  Administered 2019-05-31: 10 mL

## 2019-05-31 MED ORDER — MIDAZOLAM HCL 5 MG/5ML IJ SOLN
INTRAMUSCULAR | Status: DC | PRN
  Administered 2019-05-31: 3 mg via INTRAVENOUS
  Administered 2019-05-31: 2 mg via INTRAVENOUS

## 2019-05-31 MED ORDER — LACTATED RINGERS IV SOLN: 500.0000 mL | Freq: Once | INTRAVENOUS | Status: DC | PRN

## 2019-05-31 MED ORDER — PROTAMINE SULFATE 10 MG/ML IV SOLN
INTRAVENOUS | Status: DC | PRN
  Administered 2019-05-31: 50 mg via INTRAVENOUS
  Administered 2019-05-31: 250 mg via INTRAVENOUS

## 2019-05-31 MED ORDER — INSULIN REGULAR BOLUS VIA INFUSION
0.0000 [IU] | Freq: Three times a day (TID) | INTRAVENOUS | Status: DC
  Filled 2019-05-31: qty 10

## 2019-05-31 MED ORDER — ASPIRIN EC 325 MG PO TBEC: 325.0000 mg | DELAYED_RELEASE_TABLET | Freq: Every day | ORAL | Status: DC

## 2019-05-31 MED ORDER — SODIUM CHLORIDE 0.45 % IV SOLN: INTRAVENOUS | Status: DC | PRN

## 2019-05-31 MED ORDER — SUGAMMADEX SODIUM 200 MG/2ML IV SOLN
INTRAVENOUS | Status: DC | PRN
  Administered 2019-05-31: 400 mg via INTRAVENOUS

## 2019-05-31 MED ORDER — BUPIVACAINE 0.5 % ON-Q PUMP SINGLE CATH 400 ML
400.0000 mL | INJECTION | Status: AC
  Administered 2019-05-31: 400 mL
  Filled 2019-05-31: qty 340

## 2019-05-31 MED ORDER — SODIUM CHLORIDE 0.9 % IV SOLN
INTRAVENOUS | Status: DC | PRN
  Administered 2019-05-31: 20 ug/min via INTRAVENOUS

## 2019-05-31 MED ORDER — ALBUMIN HUMAN 5 % IV SOLN
INTRAVENOUS | Status: DC | PRN
  Administered 2019-05-31: 14:00:00 via INTRAVENOUS

## 2019-05-31 MED ORDER — ONDANSETRON HCL 4 MG/2ML IJ SOLN
INTRAMUSCULAR | Status: DC | PRN
  Administered 2019-05-31: 4 mg via INTRAVENOUS

## 2019-05-31 MED ORDER — CHLORHEXIDINE GLUCONATE 4 % EX LIQD: 30.0000 mL | CUTANEOUS | Status: DC

## 2019-05-31 MED ORDER — METOPROLOL TARTRATE 12.5 MG HALF TABLET
12.5000 mg | ORAL_TABLET | Freq: Once | ORAL | Status: AC
  Administered 2019-05-31: 06:00:00 12.5 mg via ORAL

## 2019-05-31 MED ORDER — ARTIFICIAL TEARS OPHTHALMIC OINT
TOPICAL_OINTMENT | OPHTHALMIC | Status: DC | PRN
  Administered 2019-05-31: 1 via OPHTHALMIC

## 2019-05-31 MED ORDER — CHLORHEXIDINE GLUCONATE 0.12 % MT SOLN
15.0000 mL | OROMUCOSAL | Status: AC
  Administered 2019-05-31: 15 mL via OROMUCOSAL

## 2019-05-31 MED ORDER — SODIUM CHLORIDE 0.9 % IV SOLN
1.5000 g | Freq: Two times a day (BID) | INTRAVENOUS | Status: AC
  Administered 2019-05-31 – 2019-06-02 (×4): 1.5 g via INTRAVENOUS
  Filled 2019-05-31 (×6): qty 1.5

## 2019-05-31 MED ORDER — PHENYLEPHRINE 40 MCG/ML (10ML) SYRINGE FOR IV PUSH (FOR BLOOD PRESSURE SUPPORT)
PREFILLED_SYRINGE | INTRAVENOUS | Status: AC
  Filled 2019-05-31: qty 10

## 2019-05-31 MED ORDER — ONDANSETRON HCL 4 MG/2ML IJ SOLN: 4.0000 mg | Freq: Four times a day (QID) | INTRAMUSCULAR | Status: DC | PRN

## 2019-05-31 MED ORDER — PROPOFOL 10 MG/ML IV BOLUS
INTRAVENOUS | Status: AC
  Filled 2019-05-31: qty 20

## 2019-05-31 MED ORDER — BUPIVACAINE HCL (PF) 0.5 % IJ SOLN
INTRAMUSCULAR | Status: AC
  Filled 2019-05-31: qty 10

## 2019-05-31 MED ORDER — LACTATED RINGERS IV SOLN: INTRAVENOUS | Status: DC

## 2019-05-31 MED ORDER — PANTOPRAZOLE SODIUM 40 MG PO TBEC
40.0000 mg | DELAYED_RELEASE_TABLET | Freq: Every day | ORAL | Status: DC
  Administered 2019-06-02 – 2019-06-05 (×4): 40 mg via ORAL
  Filled 2019-05-31 (×4): qty 1

## 2019-05-31 MED ORDER — ALBUMIN HUMAN 5 % IV SOLN: 250.0000 mL | INTRAVENOUS | Status: DC | PRN

## 2019-05-31 MED ORDER — METOPROLOL TARTRATE 12.5 MG HALF TABLET
ORAL_TABLET | ORAL | Status: AC
  Administered 2019-05-31: 06:00:00 12.5 mg via ORAL
  Filled 2019-05-31: qty 1

## 2019-05-31 MED ORDER — SODIUM CHLORIDE 0.9 % IV SOLN
INTRAVENOUS | Status: DC
  Administered 2019-05-31: 16:00:00 via INTRAVENOUS

## 2019-05-31 MED ORDER — MIDAZOLAM HCL 2 MG/2ML IJ SOLN: 2.0000 mg | INTRAMUSCULAR | Status: DC | PRN

## 2019-05-31 MED ORDER — PROTAMINE SULFATE 10 MG/ML IV SOLN
INTRAVENOUS | Status: AC
  Filled 2019-05-31: qty 50

## 2019-05-31 MED ORDER — DEXMEDETOMIDINE HCL IN NACL 400 MCG/100ML IV SOLN: 0.0000 ug/kg/h | INTRAVENOUS | Status: DC

## 2019-05-31 MED ORDER — PROTAMINE SULFATE 10 MG/ML IV SOLN
INTRAVENOUS | Status: AC
  Filled 2019-05-31: qty 10

## 2019-05-31 MED ORDER — MORPHINE SULFATE (PF) 2 MG/ML IV SOLN
1.0000 mg | INTRAVENOUS | Status: DC | PRN
  Administered 2019-05-31: 2 mg via INTRAVENOUS
  Filled 2019-05-31: qty 1

## 2019-05-31 MED ORDER — NITROGLYCERIN IN D5W 200-5 MCG/ML-% IV SOLN: 0.0000 ug/min | INTRAVENOUS | Status: DC

## 2019-05-31 MED ORDER — FENTANYL CITRATE (PF) 250 MCG/5ML IJ SOLN
INTRAMUSCULAR | Status: AC
  Filled 2019-05-31: qty 25

## 2019-05-31 MED ORDER — SODIUM CHLORIDE 0.9 % IV SOLN
INTRAVENOUS | Status: DC
  Administered 2019-05-31: 15:00:00 via INTRAVENOUS

## 2019-05-31 MED ORDER — ROCURONIUM BROMIDE 100 MG/10ML IV SOLN
INTRAVENOUS | Status: DC | PRN
  Administered 2019-05-31: 30 mg via INTRAVENOUS
  Administered 2019-05-31: 50 mg via INTRAVENOUS
  Administered 2019-05-31: 30 mg via INTRAVENOUS
  Administered 2019-05-31: 20 mg via INTRAVENOUS
  Administered 2019-05-31: 50 mg via INTRAVENOUS

## 2019-05-31 MED ORDER — ACETAMINOPHEN 160 MG/5ML PO SOLN: 650.0000 mg | Freq: Once | ORAL | Status: AC

## 2019-05-31 MED ORDER — DEXAMETHASONE SODIUM PHOSPHATE 10 MG/ML IJ SOLN
INTRAMUSCULAR | Status: DC | PRN
  Administered 2019-05-31: 10 mg via INTRAVENOUS

## 2019-05-31 MED ORDER — SODIUM BICARBONATE 8.4 % IV SOLN
INTRAVENOUS | Status: DC | PRN
  Administered 2019-05-31: 50 meq via INTRAVENOUS

## 2019-05-31 MED ORDER — ACETAMINOPHEN 650 MG RE SUPP
650.0000 mg | Freq: Once | RECTAL | Status: AC
  Administered 2019-05-31: 650 mg via RECTAL
  Filled 2019-05-31: qty 1

## 2019-05-31 MED ORDER — PLASMA-LYTE 148 IV SOLN
INTRAVENOUS | Status: DC | PRN
  Administered 2019-05-31: 500 mL via INTRAVASCULAR

## 2019-05-31 MED ORDER — SODIUM BICARBONATE 8.4 % IV SOLN
INTRAVENOUS | Status: AC
  Filled 2019-05-31: qty 50

## 2019-05-31 MED ORDER — POTASSIUM CHLORIDE 10 MEQ/50ML IV SOLN
10.0000 meq | INTRAVENOUS | Status: AC
  Administered 2019-05-31 (×3): 10 meq via INTRAVENOUS

## 2019-05-31 MED ORDER — SODIUM CHLORIDE 0.9% FLUSH
3.0000 mL | Freq: Two times a day (BID) | INTRAVENOUS | Status: DC
  Administered 2019-06-01 – 2019-06-05 (×8): 3 mL via INTRAVENOUS

## 2019-05-31 MED ORDER — VANCOMYCIN HCL 1000 MG IV SOLR
INTRAVENOUS | Status: DC | PRN
  Administered 2019-05-31: 08:00:00 1000 mL

## 2019-05-31 MED ORDER — PHENYLEPHRINE 40 MCG/ML (10ML) SYRINGE FOR IV PUSH (FOR BLOOD PRESSURE SUPPORT)
PREFILLED_SYRINGE | INTRAVENOUS | Status: AC
  Filled 2019-05-31: qty 20

## 2019-05-31 MED ORDER — MIDAZOLAM HCL (PF) 10 MG/2ML IJ SOLN
INTRAMUSCULAR | Status: AC
  Filled 2019-05-31: qty 2

## 2019-05-31 MED ORDER — ACETAMINOPHEN 500 MG PO TABS: 1000.0000 mg | ORAL_TABLET | Freq: Four times a day (QID) | ORAL | Status: DC

## 2019-05-31 MED ORDER — ASPIRIN 81 MG PO CHEW
324.0000 mg | CHEWABLE_TABLET | Freq: Every day | ORAL | Status: DC
  Filled 2019-05-31: qty 4

## 2019-05-31 MED ORDER — HEPARIN SODIUM (PORCINE) 1000 UNIT/ML IJ SOLN
INTRAMUSCULAR | Status: DC | PRN
  Administered 2019-05-31: 34000 [IU] via INTRAVENOUS

## 2019-05-31 MED ORDER — EPHEDRINE 5 MG/ML INJ
INTRAVENOUS | Status: AC
  Filled 2019-05-31: qty 10

## 2019-05-31 MED ORDER — SODIUM CHLORIDE 0.9 % IV SOLN: 250.0000 mL | INTRAVENOUS | Status: DC

## 2019-05-31 MED ORDER — SODIUM CHLORIDE 0.9 % IV SOLN
INTRAVENOUS | Status: DC | PRN
  Administered 2019-05-31: 10 ug/min via INTRAVENOUS

## 2019-05-31 MED ORDER — 0.9 % SODIUM CHLORIDE (POUR BTL) OPTIME
TOPICAL | Status: DC | PRN
  Administered 2019-05-31: 4000 mL

## 2019-05-31 MED ORDER — CHLORHEXIDINE GLUCONATE 0.12 % MT SOLN
OROMUCOSAL | Status: AC
  Administered 2019-05-31: 06:00:00 15 mL
  Filled 2019-05-31: qty 15

## 2019-05-31 MED ORDER — FENTANYL CITRATE (PF) 100 MCG/2ML IJ SOLN
INTRAMUSCULAR | Status: DC | PRN
  Administered 2019-05-31: 100 ug via INTRAVENOUS
  Administered 2019-05-31: 50 ug via INTRAVENOUS
  Administered 2019-05-31: 200 ug via INTRAVENOUS
  Administered 2019-05-31: 150 ug via INTRAVENOUS
  Administered 2019-05-31: 100 ug via INTRAVENOUS

## 2019-05-31 MED ORDER — TRAMADOL HCL 50 MG PO TABS
50.0000 mg | ORAL_TABLET | ORAL | Status: DC | PRN
  Administered 2019-06-02 – 2019-06-04 (×2): 100 mg via ORAL
  Filled 2019-05-31 (×2): qty 2

## 2019-05-31 MED ORDER — LIDOCAINE 2% (20 MG/ML) 5 ML SYRINGE
INTRAMUSCULAR | Status: AC
  Filled 2019-05-31: qty 5

## 2019-05-31 MED ORDER — CHLORHEXIDINE GLUCONATE 0.12 % MT SOLN: 15.0000 mL | Freq: Once | OROMUCOSAL | Status: DC

## 2019-05-31 MED ORDER — METOPROLOL TARTRATE 5 MG/5ML IV SOLN
2.5000 mg | INTRAVENOUS | Status: DC | PRN
  Administered 2019-05-31: 2.5 mg via INTRAVENOUS
  Filled 2019-05-31: qty 5

## 2019-05-31 MED ORDER — ACETAMINOPHEN 500 MG PO TABS
1000.0000 mg | ORAL_TABLET | Freq: Four times a day (QID) | ORAL | Status: DC
  Administered 2019-05-31 – 2019-06-05 (×17): 1000 mg via ORAL
  Filled 2019-05-31 (×19): qty 2

## 2019-05-31 MED ORDER — HEPARIN SODIUM (PORCINE) 1000 UNIT/ML IJ SOLN
INTRAMUSCULAR | Status: AC
  Filled 2019-05-31: qty 1

## 2019-05-31 MED ORDER — DEXMEDETOMIDINE HCL IN NACL 400 MCG/100ML IV SOLN
INTRAVENOUS | Status: DC | PRN
  Administered 2019-05-31: .3 ug/kg/h via INTRAVENOUS

## 2019-05-31 MED ORDER — BISACODYL 10 MG RE SUPP
10.0000 mg | Freq: Every day | RECTAL | Status: DC
  Filled 2019-05-31: qty 1

## 2019-05-31 SURGICAL SUPPLY — 109 items
ADAPTER CARDIO PERF ANTE/RETRO (ADAPTER) ×3 IMPLANT
BAG DECANTER FOR FLEXI CONT (MISCELLANEOUS) ×3 IMPLANT
BLADE SURG 11 STRL SS (BLADE) ×3 IMPLANT
BLADE SURG ROTATE 9660 (MISCELLANEOUS) ×3 IMPLANT
CABLE PACING FASLOC BLUE (MISCELLANEOUS) ×3 IMPLANT
CANISTER SUCT 3000ML PPV (MISCELLANEOUS) ×6 IMPLANT
CANNULA FEM VENOUS REMOTE 22FR (CANNULA) IMPLANT
CANNULA FEMORAL ART 14 SM (MISCELLANEOUS) ×3 IMPLANT
CANNULA GUNDRY RCSP 15FR (MISCELLANEOUS) ×3 IMPLANT
CANNULA OPTISITE PERFUSION 16F (CANNULA) IMPLANT
CANNULA OPTISITE PERFUSION 18F (CANNULA) ×3 IMPLANT
CANNULA SUMP PERICARDIAL (CANNULA) ×6 IMPLANT
CATH CPB KIT OWEN (MISCELLANEOUS) IMPLANT
CATH KIT ON Q 5IN SLV (PAIN MANAGEMENT) IMPLANT
CATH KIT ON-Q SILVERSOAK 5IN (CATHETERS) ×3 IMPLANT
CELLS DAT CNTRL 66122 CELL SVR (MISCELLANEOUS) ×2 IMPLANT
CONN ST 1/4X3/8  BEN (MISCELLANEOUS) ×2
CONN ST 1/4X3/8 BEN (MISCELLANEOUS) ×4 IMPLANT
CONNECTOR 1/2X3/8X1/2 3 WAY (MISCELLANEOUS) ×1
CONNECTOR 1/2X3/8X1/2 3WAY (MISCELLANEOUS) ×2 IMPLANT
CONT SPEC 4OZ CLIKSEAL STRL BL (MISCELLANEOUS) ×6 IMPLANT
COVER BACK TABLE 24X17X13 BIG (DRAPES) ×3 IMPLANT
COVER WAND RF STERILE (DRAPES) ×3 IMPLANT
CRADLE DONUT ADULT HEAD (MISCELLANEOUS) ×3 IMPLANT
DERMABOND ADVANCED (GAUZE/BANDAGES/DRESSINGS) ×2
DERMABOND ADVANCED .7 DNX12 (GAUZE/BANDAGES/DRESSINGS) ×4 IMPLANT
DEVICE CLOSURE PERCLS PRGLD 6F (VASCULAR PRODUCTS) ×12 IMPLANT
DEVICE PMI PUNCTURE CLOSURE (MISCELLANEOUS) ×3 IMPLANT
DEVICE SUT CK QUICK LOAD MINI (Prosthesis & Implant Heart) ×6 IMPLANT
DEVICE TROCAR PUNCTURE CLOSURE (ENDOMECHANICALS) ×3 IMPLANT
DRAIN CHANNEL 28F RND 3/8 FF (WOUND CARE) ×6 IMPLANT
DRAPE BILATERAL SPLIT (DRAPES) ×3 IMPLANT
DRAPE C-ARM 42X72 X-RAY (DRAPES) ×3 IMPLANT
DRAPE CV SPLIT W-CLR ANES SCRN (DRAPES) ×3 IMPLANT
DRAPE INCISE IOBAN 66X45 STRL (DRAPES) ×9 IMPLANT
DRAPE SLUSH/WARMER DISC (DRAPES) ×3 IMPLANT
DRSG AQUACEL AG ADV 3.5X10 (GAUZE/BANDAGES/DRESSINGS) ×3 IMPLANT
DRSG COVADERM 4X8 (GAUZE/BANDAGES/DRESSINGS) ×3 IMPLANT
ELECT BLADE 6.5 EXT (BLADE) ×3 IMPLANT
ELECT REM PT RETURN 9FT ADLT (ELECTROSURGICAL) ×6
ELECTRODE REM PT RTRN 9FT ADLT (ELECTROSURGICAL) ×4 IMPLANT
FELT TEFLON 1X6 (MISCELLANEOUS) ×6 IMPLANT
FEMORAL VENOUS CANN RAP (CANNULA) IMPLANT
GAUZE SPONGE 4X4 12PLY STRL (GAUZE/BANDAGES/DRESSINGS) ×3 IMPLANT
GAUZE SPONGE 4X4 12PLY STRL LF (GAUZE/BANDAGES/DRESSINGS) ×3 IMPLANT
GLOVE BIO SURGEON STRL SZ 6.5 (GLOVE) ×15 IMPLANT
GLOVE BIO SURGEON STRL SZ7 (GLOVE) ×3 IMPLANT
GLOVE BIOGEL PI IND STRL 9 (GLOVE) ×2 IMPLANT
GLOVE BIOGEL PI INDICATOR 9 (GLOVE) ×1
GLOVE ORTHO TXT STRL SZ7.5 (GLOVE) ×9 IMPLANT
GOWN STRL REUS W/ TWL LRG LVL3 (GOWN DISPOSABLE) ×16 IMPLANT
GOWN STRL REUS W/TWL LRG LVL3 (GOWN DISPOSABLE) ×8
IV NS IRRIG 3000ML ARTHROMATIC (IV SOLUTION) ×3 IMPLANT
KIT BASIN OR (CUSTOM PROCEDURE TRAY) ×3 IMPLANT
KIT DILATOR VASC 18G NDL (KITS) ×6 IMPLANT
KIT DRAINAGE VACCUM ASSIST (KITS) ×3 IMPLANT
KIT SUCTION CATH 14FR (SUCTIONS) ×3 IMPLANT
KIT SUT CK MINI COMBO 4X17 (Prosthesis & Implant Heart) ×3 IMPLANT
KIT TURNOVER KIT B (KITS) ×3 IMPLANT
LEAD PACING MYOCARDI (MISCELLANEOUS) ×6 IMPLANT
LINE VENT (MISCELLANEOUS) ×3 IMPLANT
NEEDLE AORTIC ROOT 14G 7F (CATHETERS) ×3 IMPLANT
NS IRRIG 1000ML POUR BTL (IV SOLUTION) ×12 IMPLANT
PACK E MIN INVASIVE VALVE (SUTURE) ×3 IMPLANT
PACK OPEN HEART (CUSTOM PROCEDURE TRAY) ×3 IMPLANT
PAD ARMBOARD 7.5X6 YLW CONV (MISCELLANEOUS) ×6 IMPLANT
PAD ELECT DEFIB RADIOL ZOLL (MISCELLANEOUS) ×3 IMPLANT
PERCLOSE PROGLIDE 6F (VASCULAR PRODUCTS) ×18
RING MITRAL MEMO 4D 34 (Prosthesis & Implant Heart) ×3 IMPLANT
RTRCTR WOUND ALEXIS 18CM MED (MISCELLANEOUS) ×3
SET CANNULATION TOURNIQUET (MISCELLANEOUS) ×3 IMPLANT
SET CARDIOPLEGIA MPS 5001102 (MISCELLANEOUS) ×3 IMPLANT
SET IRRIG TUBING LAPAROSCOPIC (IRRIGATION / IRRIGATOR) ×3 IMPLANT
SET MICROPUNCTURE 5F STIFF (MISCELLANEOUS) ×3 IMPLANT
SHEATH PINNACLE 8F 10CM (SHEATH) ×9 IMPLANT
SOLUTION ANTI FOG 6CC (MISCELLANEOUS) ×3 IMPLANT
SUT BONE WAX W31G (SUTURE) ×3 IMPLANT
SUT ETHIBOND (SUTURE) ×6 IMPLANT
SUT ETHIBOND 2 0 SH (SUTURE) ×6 IMPLANT
SUT ETHIBOND 2-0 RB-1 WHT (SUTURE) ×6 IMPLANT
SUT ETHIBOND X763 2 0 SH 1 (SUTURE) ×3 IMPLANT
SUT GORETEX CV 4 TH 22 36 (SUTURE) IMPLANT
SUT GORETEX CV-5THC-13 36IN (SUTURE) ×48 IMPLANT
SUT GORETEX CV4 TH-18 (SUTURE) IMPLANT
SUT PROLENE 3 0 SH1 36 (SUTURE) ×12 IMPLANT
SUT PROLENE 4 0 RB 1 (SUTURE) ×10
SUT PROLENE 4-0 RB1 .5 CRCL 36 (SUTURE) ×20 IMPLANT
SUT SILK 2 0 SH CR/8 (SUTURE) IMPLANT
SUT SILK 3 0 SH CR/8 (SUTURE) IMPLANT
SUT VIC AB 2-0 CTX 36 (SUTURE) IMPLANT
SUT VIC AB 3-0 SH 8-18 (SUTURE) IMPLANT
SUT VICRYL 2 TP 1 (SUTURE) IMPLANT
SWAB COLLECTION DEVICE MRSA (MISCELLANEOUS) ×3 IMPLANT
SYR 10ML LL (SYRINGE) ×3 IMPLANT
SYSTEM SAHARA CHEST DRAIN ATS (WOUND CARE) ×3 IMPLANT
TAPE CLOTH SURG 4X10 WHT LF (GAUZE/BANDAGES/DRESSINGS) ×3 IMPLANT
TAPE PAPER 2X10 WHT MICROPORE (GAUZE/BANDAGES/DRESSINGS) ×3 IMPLANT
TOWEL GREEN STERILE (TOWEL DISPOSABLE) ×3 IMPLANT
TOWEL GREEN STERILE FF (TOWEL DISPOSABLE) ×3 IMPLANT
TRAY FOLEY SLVR 16FR TEMP STAT (SET/KITS/TRAYS/PACK) ×3 IMPLANT
TROCAR BLADELESS 11MM (ENDOMECHANICALS) ×3 IMPLANT
TROCAR XCEL BLADELESS 5X75MML (TROCAR) ×3 IMPLANT
TROCAR XCEL NON-BLD 11X100MML (ENDOMECHANICALS) ×6 IMPLANT
TUBE ANAEROBIC SPECIMEN COL (MISCELLANEOUS) ×3 IMPLANT
TUBE SUCT INTRACARD DLP 20F (MISCELLANEOUS) ×3 IMPLANT
TUNNELER SHEATH ON-Q 11GX8 DSP (PAIN MANAGEMENT) ×3 IMPLANT
UNDERPAD 30X30 (UNDERPADS AND DIAPERS) ×3 IMPLANT
WATER STERILE IRR 1000ML POUR (IV SOLUTION) ×6 IMPLANT
WIRE .035 3MM-J 145CM (WIRE) ×3 IMPLANT

## 2019-05-31 NOTE — Progress Notes (Signed)
Patient ID: Calvin [name], male   DOB: April 17, 1966, 53 y.o.   MRN: [id number] EVENING ROUNDS NOTE :     [address]       [name] [address]             [phone]                 Day of Surgery Procedure(s) (LRB): MINIMALLY INVASIVE MITRAL VALVE REPAIR (MVR) Using Memo 4D Ring Size (Right) TRANSESOPHAGEAL ECHOCARDIOGRAM (TEE) (N/A)  Total Length of Stay:  LOS: 0 days  BP (!) 153/85   Pulse 89   Temp 97.7 F (36.5 C)   Resp 15   Ht 6\' 1"  (1.854 m)   Wt 97.5 kg   SpO2 99%   BMI 28.37 kg/m   .Intake/Output      06/03 0701 - 06/04 0700 06/04 0701 - 06/05 0700   I.V. (mL/kg)  2550 (26.2)   Blood  300   IV Piggyback  250   Total Intake(mL/kg)  3100 (31.8)   Urine (mL/kg/hr)  1260 (1.2)   Blood  600   Chest Tube  90   Total Output  1950   Net  +1150          . sodium chloride 10 mL/hr at 05/31/19 1510  . sodium chloride 100 mL/hr at 05/31/19 1551  . [START ON 06/01/2019] sodium chloride    . sodium chloride 10 mL/hr at 05/31/19 1510  . albumin human    . cefUROXime (ZINACEF)  IV    . dexmedetomidine (PRECEDEX) IV infusion    . famotidine (PEPCID) IV 100 mL/hr at 05/31/19 1510  . insulin 0.8 Units/hr (05/31/19 1510)  . lactated ringers    . lactated ringers    . lactated ringers 20 mL/hr at 05/31/19 1649  . magnesium sulfate 4 g (05/31/19 1552)  . nitroGLYCERIN    . phenylephrine (NEO-SYNEPHRINE) Adult infusion 20 mcg/min (05/31/19 1510)  . potassium chloride 10 mEq (05/31/19 1656)  . vancomycin       Lab Results  Component Value Date   WBC 14.9 (H) 05/31/2019   HGB 9.9 (L) 05/31/2019   HCT 29.0 (L) 05/31/2019   PLT 164 05/31/2019   GLUCOSE 96 05/31/2019   CHOL 185 02/08/2019   TRIG 179 (H) 02/08/2019   HDL 47 02/08/2019   LDLCALC 102 (H) 02/08/2019   ALT 37 05/28/2019   AST 32 05/28/2019   NA 144 05/31/2019   K 4.0 05/31/2019   CL 110 05/28/2019   CREATININE 1.04 05/28/2019   BUN 16 05/28/2019   CO2 16 (L) 05/28/2019   INR 1.6  (H) 05/31/2019   HGBA1C 6.1 (H) 05/28/2019   Extubated, awake and alert Not bleeding   Delight Ovens MD  Beeper [phone] Office [phone] 05/31/2019 5:30 PM

## 2019-05-31 NOTE — Op Note (Signed)
CARDIOTHORACIC SURGERY OPERATIVE NOTE  Date of Procedure:  05/31/2019  Preoperative Diagnosis: Severe Mitral Regurgitation  Postoperative Diagnosis: Same  Procedure:    Minimally-Invasive Mitral Valve Repair  Complex valvuloplasty including quadrangular resection of posterior leaflet  Sliding leaflet annuloplasty  Autologous pericardial patch augmentation of P1 segment of posterior leaflet  Sorin Memo 4D Ring Annuloplasty (size 34mm, catalog # [id number], serial # [id number])    Surgeon: Salvatore Decent. Cornelius Moras, MD  Assistant: Jillyn Hidden, PA-C  Anesthesia: Kipp Brood, MD  Operative Findings:  Barlow's type myxomatous degenerative disease  Bileaflet prolapse with thickened, redundant leaflet tissue  Multiple elongated primary chordae tendinae without any ruptured chordae tendinae  Severe thickening and fibrosis of subvalvular apparatus beneath P1 segment of posterior leaflet  Type II mitral valve dysfunction with severe mitral regurgitation  Normal left ventricular systolic function  Trivial residual mitral regurgitation after successful valve repair                    BRIEF CLINICAL NOTE AND INDICATIONS FOR SURGERY  Patient has a 53 year old male with history of mitral valve prolapse and mitral regurgitation, coronary artery disease status post acute non-ST segment elevation myocardial infarction in 2015 treated with PCI and stenting of the diagonal branch,chronic sinusitis,and hyperlipidemia who has been referred for surgical consultation to discuss treatment options for management of severe symptomatic primary mitral regurgitation.  Patient states that he was first noted to have a heart murmur on physical exam by his primary care physician more than 5 years ago. He has been followed regularly ever since. In 2015 he presented with chest discomfort and a near syncopal episode and ruled in for non-ST segment elevation myocardial infarction. He was found to  have high-grade stenosis of the diagonal branch of left anterior descending coronary artery and underwent PCI and stenting. He was noted to have 50 to 60% stenosis of the right coronary artery and otherwise mild nonobstructive disease. He has been followed regularly ever since by Dr. [name]. He states that he has done well clinically until this past December when he noted a relatively sudden change in his exercise tolerance with the development of new onset exertional shortness of breath. Routine follow-up echocardiogram performed February 13, 2019 revealed normal left ventricular function. There was mitral valve prolapse with what was felt to be moderate mitral regurgitation. Because of worsening symptoms the patient underwent TEE on March 05, 2019. This revealed mitral valve prolapse with severe mitral regurgitation. The jet of regurgitation was quite eccentric but the PISAradius measures 0.97 cm corresponding to ER O of 0.33 cm and regurgitant volume calculated 69.6 mL. Patient subsequently underwent diagnostic cardiac catheterization by Dr. [name]. The stent in the diagonal branch remained widely patent although there was 70-80 ostial stenosis of the diagonal branch. There remained what was felt to be nonobstructive coronary artery disease in the remainder of the coronary arteries. Right heart pressures were normal. Large V waves were not seen on wedge tracing.  The patient has been seen in consultation and counseled at length regarding the indications, risks and potential benefits of surgery.  All questions have been answered, and the patient provides full informed consent for the operation as described.    DETAILS OF THE OPERATIVE PROCEDURE  Preparation:  The patient is brought to the operating room on the above mentioned date and central monitoring was established by the anesthesia team including placement of Swan-Ganz catheter through the left internal jugular vein.  A radial arterial  line is placed. The  patient is placed in the supine position on the operating table.  Intravenous antibiotics are administered. General endotracheal anesthesia is induced uneventfully. The patient is initially intubated using a dual lumen endotracheal tube.  A Foley catheter is placed.  Baseline transesophageal echocardiogram was performed.  Findings were notable for myxomatous degenerative disease of the mitral valve.  There was severe bileaflet billowing and prolapse.  Both the anterior and posterior leaflets were relatively large, moderately thickened, and with excessive leaflet tissue.  There was severe elongation of multiple primary chordae tendinae.  There did not appear to be any ruptured cords or flail segments.  There was normal left ventricular systolic function.  The aortic valve was normal.  Right ventricular size and function was normal.  A soft roll is placed behind the patient's left scapula and the neck gently extended and turned to the left.   The patient's right neck, chest, abdomen, both groins, and both lower extremities are prepared and draped in a sterile manner. A time out procedure is performed.   Percutaneous Vascular Access:  Percutaneous arterial and venous access were obtained on the right side.  Using ultrasound guidance the right common femoral vein was cannulated using the Seldinger technique and a pair or Perclose vascular closure devises were placed, after which time an 8 French sheath inserted.  The right common femoral artery was cannulated using a micropuncture wire and sheath.  A pair of Perclose vascular closure devices were placed at opposing 30 degree angles in the femoral artery, and a 8 French sheath inserted.  The right internal jugular vein was cannulated  using ultrasound guidance and an 8 French sheath inserted.     Surgical Approach:  A right miniature anterolateral thoracotomy incision is performed. The incision is placed just lateral to and superior to  the right nipple. The pectoralis major muscle is retracted medially and completely preserved. The right pleural space is entered through the 4th intercostal space. A soft tissue retractor is placed.  Two 11 mm ports are placed through separate stab incisions inferiorly. The right pleural space is insufflated continuously with carbon dioxide gas through the posterior port during the remainder of the operation.  A pledgeted sutures placed through the dome of the right hemidiaphragm and retracted inferiorly to facilitate exposure.  A longitudinal incision is made in the pericardium 3 cm anterior to the phrenic nerve and silk traction sutures are placed on either side of the incision for exposure.   Extracorporeal Cardiopulmonary Bypass and Myocardial Protection:   The patient was heparinized systemically.  The right common femoral vein is cannulated through the venous sheath and a guidewire advanced into the right atrium using TEE guidance.  The femoral vein cannulated using a 22 Fr long femoral venous cannula.  The right common femoral artery is cannulated through the arterial sheath and a guidewire advanced into the descending thoracic aorta using TEE guidance.  Femoral artery is cannulated with a 18 French femoral arterial cannula.  The right internal jugular vein is cannulated through the venous sheath and a guidewire advanced into the right atrium.  The internal jugular vein is cannulated using a 14 French pediatric femoral venous cannula.   Adequate heparinization is verified.   The entire pre-bypass portion of the operation was notable for stable hemodynamics.  Cardiopulmonary bypass was begun.  Vacuum assist venous drainage is utilized. The incision in the pericardium is extended in both directions. Venous drainage and exposure are notably excellent. A retrograde cardioplegia cannula is placed through the right  atrium into the coronary sinus using transesophageal echocardiogram guidance.  An antegrade  cardioplegia cannula is placed in the ascending aorta.    The patient is cooled to 32C systemic temperature.  The aortic cross clamp is applied and cardioplegia is delivered initially in an antegrade fashion through the aortic root using modified [place] cold blood cardioplegia ([facility] blood cardioplegia protocol).   The initial cardioplegic arrest is rapid with early diastolic arrest.  Repeat doses of cardioplegia are administered at 90 minutes and every 30 minutes thereafter through the coronary sinus catheter in order to maintain completely flat electrocardiogram.  Myocardial protection was felt to be excellent.   Mitral Valve Repair:  A left atriotomy incision was performed through the interatrial groove and extended partially across the back wall of the left atrium after opening the oblique sinus inferiorly.  The mitral valve is exposed using a self-retaining retractor.  The mitral valve was inspected and notable for Barlow's type myxomatous degenerative disease with severe bileaflet billowing and prolapse.  There was redundant leaflet tissue involving both leaflets with particularly severe involvement of the posterior leaflet.  Most of the primary chordae tendinae were elongated.  There was moderate to severe fibrosis involving some of the subvalvular apparatus beneath the posterior leaflet with particularly severe distortion beneath the P1 segment of the posterior leaflet.  There did not appear to be significant calcification.  Interrupted 2-0 Ethibond horizontal mattress sutures are placed circumferentially around the entire mitral valve annulus. The sutures will ultimately be utilized for ring annuloplasty, and at this juncture there are utilized to suspend the valve symmetrically.  The posterior leaflet was repaired using a long segment quadrangular resection to foreshorten the total height of the posterior leaflet, debulk the excessive leaflet tissue, and divide the fibrotic secondary  cords beneath the leaflet.  The posterior leaflet was mobilized off of the posterior annulus in a longitudinal fashion.  All of P2 was mobilized and the segments of P1 and P3 adjacent P2 were also mobilized.  The quadrangular resection comprised approximately 30% of the total surface area of P2 and small portions of P1 and P3.  After leaflet resection the posterior leaflet was reapproximated to the posterior annulus using a sliding leaflet annuloplasty and the remaining vertical defect between the 2 portions of the posterior leaflet was closed, all using 4-0 Prolene suture.  After completion of the sliding leaflet plasty the valve was tested with saline.  There still appeared to be some restriction beneath P1 related to the matted and fibrotic subvalvular apparatus.  The vertical incision in the posterior leaflet was then reopened and further sharp excision of the subvalvular apparatus and secondary cords that were severely fibrotic beneath P1 was performed.  At this juncture simple linear closure of the valve leaflet in this area appears inadequate due to inadequate P1 tissue.  The primary cords to P1 are excised with a small patch of leaflet tissue to be later reimplanted.  A small rectangular patch of the patient's autologous pericardium is excised, trimmed to an appropriate shape, and subsequently utilized to resuspend the P1 segment of the posterior leaflet.  The pericardial patch is sewn in place using interrupted CV 5 Gore-Tex suture.  The primary cords to this segment were then reattached to the free margin of the pericardial patch.   The valve was tested with saline and appeared competent even without ring annuloplasty complete. The valve was sized to a 34 mm annuloplasty ring, based upon the transverse distance between the left and  right commissures and the height of the anterior leaflet, corresponding to a size just slightly larger than the overall surface area of the anterior leaflet.  A Sorin Memo 4D  annuloplasty ring (size 34 mm, catalog #[id number], serial [id number]) was secured in place uneventfully. All ring sutures were secured using a Cor-knot device.    The valve was tested with saline and appeared competent. There is no residual leak. There was a broad, symmetrical line of coaptation of the anterior and posterior leaflet which was confirmed using the blue ink test.  Rewarming is begun.   Procedure Completion:  The atriotomy was closed using a 2-layer closure of running 3-0 Prolene suture after placing a sump drain across the mitral valve to serve as a left ventricular vent.  One final dose of warm retrograde "reanimation dose" cardioplegia was administered retrograde through the coronary sinus catheter while all air was evacuated through the aortic root.  The aortic cross clamp was removed after a total cross clamp time of 170 minutes.  Epicardial pacing wires are fixed to the inferior wall of the right ventricule and to the right atrial appendage. The patient is rewarmed to 37C temperature.  Initially the patient's rhythm is very slow bradycardia with wide-complex and some suggestion of ST segment elevation.  The patient is rewarmed for an extended period of time and the patient's rhythm improves, suggesting the possibility of intracoronary air which ultimately resolved.  The left ventricular vent is removed.    The patient is ventilated and flow volumes turndown while the mitral valve repair is inspected using transesophageal echocardiogram. The valve repair appears intact with no residual leak. The antegrade cardioplegia cannula is removed. The patient is weaned and disconnected from cardiopulmonary bypass.  The patient's rhythm at separation from bypass was atrial paced.  The patient was initially weaned from bypass on low dose dopamine, but dopamine was subsequently discontinued as the patient was initially hyperdynamic. Total cardiopulmonary bypass time for the operation was 226  minutes.  Followup transesophageal echocardiogram performed after separation from bypass revealed a well-seated annuloplasty ring in the mitral position with a normal functioning mitral valve. There was trivial residual leak.  Left ventricular function was unchanged from preoperatively.  The mean gradient across the mitral valve was estimated to be 4 mmHg.  The femoral arterial and venous cannulae were removed uneventfully. There was a palpable pulse in the distal right common femoral artery after removal of the cannula. Protamine was administered to reverse the anticoagulation. The right internal jugular cannula was removed and manual pressure held on the neck for 15 minutes.  Single lung ventilation was begun. The atriotomy closure was inspected for hemostasis. The pericardial sac was drained using a 28 French Bard drain placed through the anterior port incision.  The right pleural space is irrigated with saline solution and inspected for hemostasis.   The On-Q pain management system is utilized for postoperative analgesia.  A single lumen catheter is passed through the subcutaneous tissues from the anterior chest wall to the posterior port incision.  The catheter was then passed through the port incision into the pleural space and tunneled into the subpleural space posteriorly to cover the second through the sixth intercostal nerve roots.  The catheter was flushed with 0.5% bupivacaine solution and ultimately connected to a continuous infusion pump.  The right pleural space was drained using a 28 French Bard drain placed through the posterior port incision. The miniature thoracotomy incision was closed in multiple layers in routine fashion. The  right groin incision was inspected for hemostasis and closed in multiple layers in routine fashion.  The post-bypass portion of the operation was notable for stable rhythm and hemodynamics.  No blood products were administered during the  operation.   Disposition:  The patient tolerated the procedure well.  The patient was extubated in the operating room and subsequently transported to the surgical intensive care unit in stable condition. There were no intraoperative complications. All sponge instrument and needle counts are verified correct at completion of the operation.     Salvatore Decent. Cornelius Moras MD 05/31/2019 2:33 PM

## 2019-05-31 NOTE — Anesthesia Procedure Notes (Signed)
Central Venous Catheter Insertion Performed by: Kipp Brood, MD, anesthesiologist Start/End6/03/2019 6:40 AM, 05/31/2019 6:50 AM Patient location: Pre-op. Preanesthetic checklist: patient identified, IV checked, site marked, risks and benefits discussed, surgical consent, monitors and equipment checked, pre-op evaluation, timeout performed and anesthesia consent Position: supine Hand hygiene performed  and maximum sterile barriers used  PA cath was placed.Swan type:thermodilution Procedure performed without using ultrasound guided technique. Attempts: 1 Following insertion, line sutured, dressing applied and Biopatch. Post procedure assessment: blood return through all ports, free fluid flow and no air  Patient tolerated the procedure well with no immediate complications.

## 2019-05-31 NOTE — Anesthesia Procedure Notes (Signed)
Central Venous Catheter Insertion Performed by: Kipp Brood, MD, anesthesiologist Start/End6/03/2019 6:40 AM, 05/31/2019 6:50 AM Patient location: Pre-op. Preanesthetic checklist: patient identified, IV checked, site marked, risks and benefits discussed, surgical consent, monitors and equipment checked, pre-op evaluation, timeout performed and anesthesia consent Lidocaine 1% used for infiltration and patient sedated Hand hygiene performed  and maximum sterile barriers used  Catheter size: 8.5 Fr Sheath introducer Procedure performed using ultrasound guided technique. Ultrasound Notes:anatomy identified, needle tip was noted to be adjacent to the nerve/plexus identified, no ultrasound evidence of intravascular and/or intraneural injection and image(s) printed for medical record Attempts: 1 Following insertion, line sutured and dressing applied. Post procedure assessment: blood return through all ports, free fluid flow and no air  Patient tolerated the procedure well with no immediate complications.

## 2019-05-31 NOTE — Anesthesia Procedure Notes (Signed)
Procedure Name: Intubation Date/Time: 05/31/2019 8:06 AM Performed by: Verdie Drown, CRNA Pre-anesthesia Checklist: Patient identified, Emergency Drugs available, Suction available and Patient being monitored Patient Re-evaluated:Patient Re-evaluated prior to induction Oxygen Delivery Method: Circle system utilized Preoxygenation: Pre-oxygenation with 100% oxygen Induction Type: IV induction Ventilation: Mask ventilation without difficulty and Oral airway inserted - appropriate to patient size Laryngoscope Size: Mac and 4 Grade View: Grade I Tube type: Oral Endobronchial tube: Left, EBT position confirmed by auscultation, Double lumen EBT and EBT position confirmed by fiberoptic bronchoscope and 37 Fr Number of attempts: 1 Airway Equipment and Method: Stylet,  Oral airway and Fiberoptic brochoscope Placement Confirmation: ETT inserted through vocal cords under direct vision,  positive ETCO2 and breath sounds checked- equal and bilateral Secured at: 31 cm Tube secured with: Tape Dental Injury: Teeth and Oropharynx as per pre-operative assessment

## 2019-05-31 NOTE — Transfer of Care (Signed)
Immediate Anesthesia Transfer of Care Note  Patient: Calvin [name]  Procedure(s) Performed: MINIMALLY INVASIVE MITRAL VALVE REPAIR (MVR) Using Memo 4D Ring Size (Right Chest) TRANSESOPHAGEAL ECHOCARDIOGRAM (TEE) (N/A )  Patient Location: ICU  Anesthesia Type:General  Level of Consciousness: drowsy and patient cooperative  Airway & Oxygen Therapy: Patient Spontanous Breathing and Patient connected to face mask oxygen  Post-op Assessment: Report given to RN, Post -op Vital signs reviewed and stable and Patient moving all extremities X 4  Post vital signs: Reviewed and stable  Last Vitals:  Vitals Value Taken Time  BP    Temp    Pulse 80 05/31/2019  3:12 PM  Resp 16 05/31/2019  3:12 PM  SpO2 99 % 05/31/2019  3:12 PM  Vitals shown include unvalidated device data.  Last Pain:  Vitals:   05/31/19 0554  TempSrc:   PainSc: 0-No pain      Patients Stated Pain Goal: 3 (05/31/19 0554)  Complications: No apparent anesthesia complications   Patient transported to ICU with standard monitors (HR, BP, SPO2, RR) and emergency drugs/equipment. Spontaneous breathing with face mask. Report given to bedside RN. Pt connected to ICU monitor. All questions answered and vital signs stable before leaving

## 2019-05-31 NOTE — Anesthesia Procedure Notes (Signed)
Arterial Line Insertion Start/End6/03/2019 6:54 AM Performed by: Burt Ek, CRNA, CRNA  Patient location: Pre-op. Preanesthetic checklist: patient identified, IV checked, site marked, risks and benefits discussed, surgical consent, monitors and equipment checked, pre-op evaluation, timeout performed and anesthesia consent Left, radial was placed Catheter size: 20 G Hand hygiene performed  and maximum sterile barriers used   Attempts: 1 Procedure performed without using ultrasound guided technique. Following insertion, dressing applied and Biopatch. Post procedure assessment: normal  Patient tolerated the procedure well with no immediate complications.

## 2019-05-31 NOTE — Anesthesia Preprocedure Evaluation (Signed)
Anesthesia Evaluation  Patient identified by MRN, date of birth, ID band Patient awake    Reviewed: Allergy & Precautions, NPO status , Patient's Chart, lab work & pertinent test results  Airway Mallampati: II  TM Distance: >3 FB Neck ROM: Full    Dental  (+) Teeth Intact, Dental Advisory Given   Pulmonary    breath sounds clear to auscultation       Cardiovascular hypertension,  Rhythm:Regular Rate:Normal + Systolic murmurs    Neuro/Psych    GI/Hepatic   Endo/Other    Renal/GU      Musculoskeletal   Abdominal   Peds  Hematology   Anesthesia Other Findings   Reproductive/Obstetrics                             Anesthesia Physical Anesthesia Plan  ASA: III  Anesthesia Plan: General   Post-op Pain Management:    Induction: Intravenous  PONV Risk Score and Plan: Ondansetron and Dexamethasone  Airway Management Planned: Double Lumen EBT  Additional Equipment: Arterial line, CVP, PA Cath, 3D TEE and Ultrasound Guidance Line Placement  Intra-op Plan:   Post-operative Plan: Possible Post-op intubation/ventilation  Informed Consent: I have reviewed the patients History and Physical, chart, labs and discussed the procedure including the risks, benefits and alternatives for the proposed anesthesia with the patient or authorized representative who has indicated his/her understanding and acceptance.     Dental advisory given  Plan Discussed with: CRNA and Anesthesiologist  Anesthesia Plan Comments:         Anesthesia Quick Evaluation

## 2019-05-31 NOTE — Interval H&P Note (Signed)
History and Physical Interval Note:  05/31/2019 6:34 AM  Calvin [name]  has presented today for surgery, with the diagnosis of MR.  The various methods of treatment have been discussed with the patient and family. After consideration of risks, benefits and other options for treatment, the patient has consented to  Procedure(s): MINIMALLY INVASIVE MITRAL VALVE REPAIR (MVR) (Right) TRANSESOPHAGEAL ECHOCARDIOGRAM (TEE) (N/A) as a surgical intervention.  The patient's history has been reviewed, patient examined, no change in status, stable for surgery.  I have reviewed the patient's chart and labs.  Questions were answered to the patient's satisfaction.     Purcell Nails

## 2019-05-31 NOTE — Brief Op Note (Addendum)
05/31/2019  1:04 PM  PATIENT:  Calvin [name]  53 y.o. male  PRE-OPERATIVE DIAGNOSIS:  MR  POST-OPERATIVE DIAGNOSIS:  MR  PROCEDURE:  Procedure(s): MINIMALLY INVASIVE MITRAL VALVE REPAIR (MVR)  -Quadrangular resection of redundant portion of posterior leaflet -Sliding leafletplasty with pericardial patch augmentation -Mitral valve annuloplasty Using Memo 4D Ring Size   TRANSESOPHAGEAL ECHOCARDIOGRAM (TEE)   SURGEON:  Surgeon(s) and Role:    Purcell Nails, MD - Primary  PHYSICIAN ASSISTANT: Gaynelle Arabian   ANESTHESIA:   general  EBL:  600 mL  BLOOD ADMINISTERED:none  DRAINS: Right pleural and mediastinal tubes   LOCAL MEDICATIONS USED:  BUPIVICAINE   SPECIMEN:  Source of Specimen:  Portion of posterior mitral valve leaflet  DISPOSITION OF SPECIMEN:  PATHOLOGY  COUNTS:  YES  DICTATION: .Dragon Dictation  PLAN OF CARE: Admit to inpatient   PATIENT DISPOSITION:  ICU - intubated and hemodynamically stable.   Delay start of Pharmacological VTE agent (>24hrs) due to surgical blood loss or risk of bleeding: yes

## 2019-06-01 ENCOUNTER — Encounter (HOSPITAL_COMMUNITY): Payer: Self-pay | Admitting: Thoracic Surgery (Cardiothoracic Vascular Surgery)

## 2019-06-01 ENCOUNTER — Inpatient Hospital Stay (HOSPITAL_COMMUNITY): Payer: BC Managed Care – PPO

## 2019-06-01 LAB — BASIC METABOLIC PANEL
Anion gap: 8 (ref 5–15)
Anion gap: 7 (ref 5–15)
BUN: 12 mg/dL (ref 6–20)
BUN: 15 mg/dL (ref 6–20)
CO2: 19 mmol/L — ABNORMAL LOW (ref 22–32)
CO2: 23 mmol/L (ref 22–32)
Calcium: 7.2 mg/dL — ABNORMAL LOW (ref 8.9–10.3)
Calcium: 8.3 mg/dL — ABNORMAL LOW (ref 8.9–10.3)
Chloride: 111 mmol/L (ref 98–111)
Chloride: 109 mmol/L (ref 98–111)
Creatinine, Ser: 0.87 mg/dL (ref 0.61–1.24)
Creatinine, Ser: 1.11 mg/dL (ref 0.61–1.24)
GFR calc Af Amer: >60 mL/min (ref >60)
GFR calc Af Amer: >60 mL/min (ref >60)
GFR calc non Af Amer: >60 mL/min (ref >60)
GFR calc non Af Amer: >60 mL/min (ref >60)
Glucose, Bld: 112 mg/dL — ABNORMAL HIGH (ref 70–99)
Glucose, Bld: 162 mg/dL — ABNORMAL HIGH (ref 70–99)
Potassium: 3.9 mmol/L (ref 3.5–5.1)
Potassium: 4.1 mmol/L (ref 3.5–5.1)
Sodium: 138 mmol/L (ref 135–145)
Sodium: 139 mmol/L (ref 135–145)

## 2019-06-01 LAB — MAGNESIUM
Magnesium: 2.2 mg/dL (ref 1.7–2.4)
Magnesium: 2.2 mg/dL (ref 1.7–2.4)

## 2019-06-01 LAB — CBC
HCT: 29.9 % — ABNORMAL LOW (ref 39.0–52.0)
HCT: 30.8 % — ABNORMAL LOW (ref 39.0–52.0)
Hemoglobin: 10 g/dL — ABNORMAL LOW (ref 13.0–17.0)
Hemoglobin: 10 g/dL — ABNORMAL LOW (ref 13.0–17.0)
MCH: 30 pg (ref 26.0–34.0)
MCH: 29.2 pg (ref 26.0–34.0)
MCHC: 33.4 g/dL (ref 30.0–36.0)
MCHC: 32.5 g/dL (ref 30.0–36.0)
MCV: 89.8 fL (ref 80.0–100.0)
MCV: 89.8 fL (ref 80.0–100.0)
Platelets: 193 10*3/uL (ref 150–400)
Platelets: 168 10*3/uL (ref 150–400)
RBC: 3.33 MIL/uL — ABNORMAL LOW (ref 4.22–5.81)
RBC: 3.43 MIL/uL — ABNORMAL LOW (ref 4.22–5.81)
RDW: 13.9 % (ref 11.5–15.5)
RDW: 14.1 % (ref 11.5–15.5)
WBC: 12.6 10*3/uL — ABNORMAL HIGH (ref 4.0–10.5)
WBC: 11.5 10*3/uL — ABNORMAL HIGH (ref 4.0–10.5)
nRBC: 0 % (ref 0.0–0.2)
nRBC: 0 % (ref 0.0–0.2)

## 2019-06-01 LAB — GLUCOSE, CAPILLARY
Glucose-Capillary: 104 mg/dL — ABNORMAL HIGH (ref 70–99)
Glucose-Capillary: 105 mg/dL — ABNORMAL HIGH (ref 70–99)
Glucose-Capillary: 107 mg/dL — ABNORMAL HIGH (ref 70–99)
Glucose-Capillary: 107 mg/dL — ABNORMAL HIGH (ref 70–99)
Glucose-Capillary: 119 mg/dL — ABNORMAL HIGH (ref 70–99)
Glucose-Capillary: 98 mg/dL (ref 70–99)

## 2019-06-01 MED ORDER — POTASSIUM CHLORIDE 10 MEQ/50ML IV SOLN
10.0000 meq | INTRAVENOUS | Status: AC
  Administered 2019-06-01 (×3): 10 meq via INTRAVENOUS
  Filled 2019-06-01 (×4): qty 50

## 2019-06-01 MED ORDER — WARFARIN - PHYSICIAN DOSING INPATIENT: Freq: Every day | Status: DC

## 2019-06-01 MED ORDER — ASPIRIN EC 81 MG PO TBEC
81.0000 mg | DELAYED_RELEASE_TABLET | Freq: Every day | ORAL | Status: DC
  Administered 2019-06-02 – 2019-06-05 (×4): 81 mg via ORAL
  Filled 2019-06-01 (×4): qty 1

## 2019-06-01 MED ORDER — POTASSIUM CHLORIDE CRYS ER 20 MEQ PO TBCR
20.0000 meq | EXTENDED_RELEASE_TABLET | Freq: Every day | ORAL | Status: DC
  Administered 2019-06-02 – 2019-06-04 (×3): 20 meq via ORAL
  Filled 2019-06-01 (×3): qty 1

## 2019-06-01 MED ORDER — KETOROLAC TROMETHAMINE 15 MG/ML IJ SOLN
15.0000 mg | Freq: Four times a day (QID) | INTRAMUSCULAR | Status: AC
  Administered 2019-06-01 – 2019-06-02 (×5): 15 mg via INTRAVENOUS
  Filled 2019-06-01 (×5): qty 1

## 2019-06-01 MED ORDER — INSULIN ASPART 100 UNIT/ML ~~LOC~~ SOLN: 0.0000 [IU] | SUBCUTANEOUS | Status: DC

## 2019-06-01 MED ORDER — FUROSEMIDE 10 MG/ML IJ SOLN
20.0000 mg | Freq: Four times a day (QID) | INTRAMUSCULAR | Status: AC
  Administered 2019-06-01 (×3): 20 mg via INTRAVENOUS
  Filled 2019-06-01 (×3): qty 2

## 2019-06-01 MED ORDER — FUROSEMIDE 40 MG PO TABS: 40.0000 mg | ORAL_TABLET | Freq: Every day | ORAL | Status: DC

## 2019-06-01 MED ORDER — MOVING RIGHT ALONG BOOK
Freq: Once | Status: AC
  Administered 2019-06-01: 09:00:00
  Filled 2019-06-01: qty 1

## 2019-06-01 MED ORDER — ROSUVASTATIN CALCIUM 5 MG PO TABS
5.0000 mg | ORAL_TABLET | Freq: Every day | ORAL | Status: DC
  Administered 2019-06-04 – 2019-06-05 (×2): 5 mg via ORAL
  Filled 2019-06-01 (×2): qty 1

## 2019-06-01 MED ORDER — ASPIRIN EC 325 MG PO TBEC
325.0000 mg | DELAYED_RELEASE_TABLET | Freq: Every day | ORAL | Status: AC
  Administered 2019-06-01: 325 mg via ORAL
  Filled 2019-06-01: qty 1

## 2019-06-01 MED ORDER — ENOXAPARIN SODIUM 30 MG/0.3ML ~~LOC~~ SOLN
30.0000 mg | Freq: Every day | SUBCUTANEOUS | Status: DC
  Administered 2019-06-02 – 2019-06-04 (×3): 30 mg via SUBCUTANEOUS
  Filled 2019-06-01 (×3): qty 0.3

## 2019-06-01 MED ORDER — METOPROLOL TARTRATE 12.5 MG HALF TABLET
12.5000 mg | ORAL_TABLET | Freq: Two times a day (BID) | ORAL | Status: DC
  Administered 2019-06-01 – 2019-06-02 (×3): 12.5 mg via ORAL
  Filled 2019-06-01 (×3): qty 1

## 2019-06-01 MED ORDER — WARFARIN SODIUM 2.5 MG PO TABS
2.5000 mg | ORAL_TABLET | Freq: Every day | ORAL | Status: DC
  Administered 2019-06-01 – 2019-06-03 (×3): 2.5 mg via ORAL
  Filled 2019-06-01 (×3): qty 1

## 2019-06-01 MED ORDER — CHLORHEXIDINE GLUCONATE CLOTH 2 % EX PADS
6.0000 | MEDICATED_PAD | Freq: Every day | CUTANEOUS | Status: DC
  Administered 2019-06-01: 6 via TOPICAL

## 2019-06-01 MED ORDER — FUROSEMIDE 40 MG PO TABS
40.0000 mg | ORAL_TABLET | Freq: Two times a day (BID) | ORAL | Status: DC
  Administered 2019-06-02 – 2019-06-04 (×6): 40 mg via ORAL
  Filled 2019-06-01 (×6): qty 1

## 2019-06-01 NOTE — Progress Notes (Signed)
Left IJ removed per MD order without difficulty.  Will continue to monitor.

## 2019-06-01 NOTE — Discharge Summary (Signed)
Physician Discharge Summary  Patient ID: Calvin [name] MRN: [id number] DOB/AGE: 1966-06-08 52 y.o.  Admit date: 05/31/2019 Discharge date: 06/05/2019  Admission Diagnoses: Mitral valve prolapse Severe mitral regurgitation Coronary artery disease, s/p PTCI of LAD and diagonal         coronary arteries  Discharge Diagnoses:  Principal Problem:   S/P minimally invasive mitral valve repair Active Problems:   Mitral valve prolapse   Severe mitral regurgitation Coronary artery disease, s/p PTCI of LAD and diagonal           coronary arteries Postoperative second degree type 1 heart block with      resolution to NSR prior to discharge Postoperative atelectasis   Discharged Condition: good  History of Present Illness:   Patient has a 53 year old male with history of mitral valve prolapse and mitral regurgitation, coronary artery disease status post acute non-ST segment elevation myocardial infarction in 2015 treated with PCI and stenting of the diagonal branch,chronic sinusitis,and hyperlipidemia who has been referred for surgical consultation to discuss treatment options for management of severe symptomatic primary mitral regurgitation.  Patient states that he was first noted to have a heart murmur on physical exam by his primary care physician more than 5 years ago. He has been followed regularly ever since. In 2015 he presented with chest discomfort and a near syncopal episode and ruled in for non-ST segment elevation myocardial infarction. He was found to have high-grade stenosis of the diagonal branch of left anterior descending coronary artery and underwent PCI and stenting. He was noted to have 50 to 60% stenosis of the right coronary artery and otherwise mild nonobstructive disease. He has been followed regularly ever since by Dr. [name]. He states that he has done well clinically until this past December when he noted a relatively sudden change in his exercise tolerance with the  development of new onset exertional shortness of breath. Routine follow-up echocardiogram performed February 13, 2019 revealed normal left ventricular function. There was mitral valve prolapse with what was felt to be moderate mitral regurgitation. Because of worsening symptoms the patient underwent TEE on March 05, 2019. This revealed mitral valve prolapse with severe mitral regurgitation. The jet of regurgitation was quite eccentric but the PISAradius measures 0.97 cm corresponding to ER O of 0.33 cm and regurgitant volume calculated 69.6 mL. Patient subsequently underwent diagnostic cardiac catheterization by Dr. [name]. The stent in the diagonal branch remained widely patent although there was 70-80 ostial stenosis of the diagonal branch. There remained what was felt to be nonobstructive coronary artery disease in the remainder of the coronary arteries. Right heart pressures were normal. Large V waves were not seen on wedge tracing.  Patient lives locally in [place] with his wife. 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 He states that he had remained fairly active physically until this past December when he began to experience worsening shortness of breath. He now gets short of breath with moderate level exertion such as walking up a flight of stairs. He denies resting shortness of breath, PND, orthopnea, or lower extremity edema. He has not had any chest pain either with activity or at rest. He has not had any dizzy spells or syncope.  Patient elected to proceed with minimally invasive mitral valve repair. He was originally seen in consultation on May 14, 2019. He reports no new problems or complaints. He specifically denies any recent development of cough, fevers, or worsening shortness of  breath. He has not been exposed to any persons with known or suspected COVID-19 infection.  Hospital Course:  The  patient was admitted to the hospital for same-day surgery on 05/31/2019.  He was prepared and taken to the operating room where mini mitral valve repair was accomplished.  Please see operative note below for details.  He was extubated immediately postop.  Vital signs and hemodynamics were stable and he was transferred to the cardiovascular ICU.  He remained stable.  Postop day 1, he was in a stable normal sinus rhythm.  His cardiac index was 4 on no inotropic or vasoactive drips.  The monitoring lines were removed.  He was transferred to [facility] on postop day 1.  Anticoagulation with Coumadin was initiated and INR was followed daily. He developed second degree type 1 heart block early post-op so his beta blocker was suspended.  He had prompt return of NSR. After observing for a few more days, he was noted to have borderline sinus tachycardia so low-dose metoprolol was resumed.  He went on to make a progressive and satisfactory recovery and was ready for discharge on 06/05/19.  His INR was 1.5 after coumadin dosing of 2.5mg  x 3 days followed by 5mg  x 1 day.   Consults: None  Significant Diagnostic Studies:   ECHOCARDIOGRAM REPORT     Patient Name: Calvin [name] Date of Exam: 02/13/2019 Medical Rec #: [id number] Height: 73.0 in Accession #: [id number] Weight: 226.8 lb Date of Birth: [birth year] BSA: 2.27 m Patient Age: 24 years BP: 146/86 mmHg Patient Gender: M HR: 64 bpm. Exam Location: [address]   Procedure: 2D Echo, Cardiac Doppler and Color Doppler  Indications: R06.02 Shortness of Breath I34.0 Mitral Valve Insufficiency I05.9 Mitral Valve Disorder  History: Patient has prior history of Echocardiogram examinations, most recent 05/13/2017. Previous Myocardial Infarction and CAD, Mitral Valve Prolapse and Mitral Valve  Disease; Risk Factors: Dyslipidemia and Family History of Coronary Artery Disease. Asthma.  Sonographer: Farrel Conners RDCS Referring Phys: [phone] Barry Dienes [facility]  IMPRESSIONS   1. The left ventricle has hyperdynamic systolic function, with an ejection fraction of >65%. The cavity size was normal. There is mildly increased left ventricular wall thickness. Left ventricular diastolic parameters were normal. 2. The right ventricle has normal systolic function. The cavity was normal. There is no increase in right ventricular wall thickness. 3. Left atrial size was moderately dilated. 4. Severe mitral valve prolapse. 5. The mitral valve is myxomatous. Mitral valve regurgitation is mild to moderate by color flow Doppler. Severe holosystolic prolaspe of the posterior leaflet is noted. 6. The interatrial septum was not well visualized. 7. The aortic valve is tricuspid. 8. The ascending aorta and aortic root are normal in size and structure. 9. The tricuspid valve is normal in structure. 10. When compared to the prior study: Compared to a prior study in 2018, the degree of MR does not appear worse however, the posterior leaflet prolapses significantly. There is now moderate LAE.  SUMMARY  LVEF 65-70%, mild LVH, normal wall motion, moderate LAE, severe holosystolic prolapse of the posterior mitral leaflet with mild to moderate regurgitation. Consider TEE evaluation as the eccentric MR jet may underestimate the severity of regurgitation. FINDINGS Left Ventricle: The left ventricle has hyperdynamic systolic function, with an ejection fraction of >65%. The cavity size was normal. There is mildly increased left ventricular wall thickness. Left ventricular diastolic parameters were normal Normal  left ventricular filling pressures Right Ventricle: The right ventricle has normal systolic  function. The cavity was normal. There is no increase in right  ventricular wall thickness.    Left Atrium: left atrial size was moderately dilated Right Atrium: right atrial size was normal in size.  Interatrial Septum: The interatrial septum was not well visualized. Pericardium: There is no evidence of pericardial effusion. Mitral Valve: The mitral valve is myxomatous. Mitral valve regurgitation is mild to moderate by color flow Doppler. There is severe holosystolic prolapse of of the posterior mitral leaflet of the mitral valve. Tricuspid Valve: The tricuspid valve is normal in structure. Tricuspid valve regurgitation is trivial by color flow Doppler. Aortic Valve: The aortic valve is tricuspid Aortic valve regurgitation was not visualized by color flow Doppler. There is no evidence of aortic valve stenosis. Late systolic prolaspe of the posterior mitral leaflet. Pulmonic Valve: The pulmonic valve was not well visualized. Pulmonic valve regurgitation is not visualized by color flow Doppler. Aorta: The ascending aorta and aortic root are normal in size and structure. In comparison to the previous echocardiogram(s): Compared to a prior study in 2018, the degree of MR does not appear worse however, the posterior leaflet prolapses significantly. There is now moderate LAE.  LEFT VENTRICLE PLAX 2D (Teich) LV EF: 75.0 % Diastology LVIDd: 5.06 cm LV e' lateral: 12.40 cm/s LVIDs: 2.83 cm LV E/e' lateral: 9.9 LV PW: 1.22 cm LV e' medial: 9.79 cm/s LV IVS: 1.04 cm LV E/e' medial: 12.5 LVOT diam: 2.40 cm LV SV: 91 ml LVOT Area: 4.52 cm  RIGHT VENTRICLE RV S prime: 13.50 cm/s TAPSE (M-mode): 2.2 cm  LEFT ATRIUM Index RIGHT ATRIUM Index LA diam: 5.00 cm 2.20 cm/m RA Area: 18.80 cm LA Vol (A2C): 114.0 ml 50.23 ml/m RA Volume: 54.30 ml 23.92 ml/m LA Vol (A4C): 63.2 ml 27.84 ml/m LA Biplane Vol: 90.7 ml 39.96  ml/m AORTIC VALVE LVOT Vmax: 126.33 cm/s LVOT Vmean: 75.733 cm/s LVOT VTI: 0.260 m  AORTA Ao Root diam: 3.70 cm Ao Asc diam: 3.00 cm  MITRAL VALVE MV Area (PHT): 1.65 cm MV Peak grad: 8.1 mmHg MV Mean grad: 2.0 mmHg MV Vmax: 1.42 m/s MV Vmean: 64.2 cm/s MV VTI: 0.40 m MV PHT: 133 msec MV Decel Time: 234 msec MR Peak grad: 146.9 mmHg MR Mean grad: 82.3 mmHg MR Vmax: 606.00 cm/s MR Vmean: 417.3 cm/s MR PISA: 0.57 MV E velocity: 122.50 cm/s MV A velocity: 93.35 cm/s MV E/A ratio: 1.31   Zoila Shutter MD Electronically signed by Zoila Shutter MD Signature Date/Time: 02/13/2019/1:31:00 PM    TRANSESOPHOGEAL ECHO REPORT     Patient Name: Ginger Organ Date of Exam: 03/05/2019 Medical Rec #: [id number] Height: 73.0 in Accession #: [id number] Weight: 215.0 lb Date of Birth: 07-Dec-1966 BSA: 2.22 m Patient Age: 53 years BP: 115/67 mmHg Patient Gender: M HR: 72 bpm. Exam Location: Outpatient   Procedure: Transesophageal Echo, 3D Echo, Color Doppler and Cardiac Doppler  Indications: Mitral insufficiency  History: Patient has no prior history of Echocardiogram examinations and Patient has prior history of Echocardiogram examinations, most recent 02/13/2019. CAD and Previous Myocardial Infarction, Mitral Valve Prolapse; Risk Factors: Dyslipidemia.  Sonographer: Leta Jungling RDCS Referring Phys: [phone] Barry Dienes [facility] Diagnosing Phys: [name] MD    PROCEDURE: No evidence of intracardiac thrombus. No evidence of vegetation. Consent was requested emergently by emergency room physicain. After discussion of the risks and benefits of a TEE, an informed consent was obtained from the patient. Local  oropharyngeal anesthetic was provided with viscous lidocaine.  Patients was monitored while under  deep sedation. Imaged were obtained with the patient in a left lateral decubitus position. Image quality was excellent. The patient's vital signs; including  heart rate, blood pressure, and oxygen saturation; remained stable throughout the procedure. The patient developed no complications during the procedure.  IMPRESSIONS   1. The left ventricle has normal systolic function, with an ejection fraction of 60-65%. The cavity size was normal. Left ventricular diastolic function could not be evaluated. 2. The right ventricle has normal systolc function. The cavity was normal. There is no increase in right ventricular wall thickness. 3. Left atrial size was mildly dilated. 4. Mitral valve regurgitation is severe by color flow Doppler. The MR jet is eccentric medially directed. 5. There is severe mitral valve regurgitation with a regurgitant fraction of 54.60 %. 6. Severe prolapse of the P2 segment of the mitral valve. PISA radius 0.97 cm. ERO 0.33 cm^2. Regurgitant volume 69.6 ml. 7. No stenosis of the aortic valve. 8. The ascending aorta and descending aorta are normal in size and structure.  FINDINGS Left Ventricle: The left ventricle has normal systolic function, with an ejection fraction of 60-65%. The cavity size was normal. There is no increase in left ventricular wall thickness. Left ventricular diastolic function could not be evaluated. Right Ventricle: The right ventricle has normal systolic function. The cavity was normal. There is no increase in right ventricular wall thickness. Left Atrium: Left atrial size was mildly dilated. Right Atrium: Right atrial size was normal in size. Interatrial Septum: No atrial level shunt detected by color flow Doppler. Pericardium: There is no evidence of pericardial effusion. Mitral Valve: Mitral valve regurgitation is severe by color flow Doppler. The MR jet is eccentric medially directed. Pulmonary  venous flow shows systolic flow reversal. There is severe mitral valve regurgitation with a regurgitant fraction of 54.60 %.  Severe prolapse of the P2 segment of the mitral valve. PISA radius 0.97 cm. ERO 0.33 cm^2. Regurgitant volume 69.6 ml. Tricuspid Valve: Tricuspid valve regurgitation was not visualized by color flow Doppler. Aortic Valve: Aortic valve regurgitation was not visualized by color flow Doppler. There is no stenosis of the aortic valve. Pulmonic Valve: No evidence of pulmonic stenosis. Aorta: The ascending aorta are normal in size and structure. Venous: The inferior vena cava is normal in size with greater than 50% respiratory variability.  LEFT VENTRICLE PLAX 2D (Teich) LVOT diam: 2.61 cm LVOT Area: 5.34 cm  AORTIC VALVE LVOT Vmax: 101.00 cm/s LVOT Vmean: 62.300 cm/s LVOT VTI: 0.109 m  MITRAL VALVE MV Peak grad: 7.7 mmHg MV Mean grad: 4.0 mmHg MV Vmax: 1.39 m/s MV Vmean: 91.6 cm/s MV VTI: 0.29 m MR Peak grad: 272.9 mmHg MR Mean grad: 137.0 mmHg MR Vmax: 826.00 cm/s MR Vmean: 548.5 cm/s MR PISA: 5.89 MR Regurgitant Fraction: 54.60 %   Chilton Siiffany Ehrenberg MD Electronically signed by Chilton Siiffany [place] MD Signature Date/Time: 03/05/2019/1:54:28 PM    RIGHT/LEFT HEART CATH AND CORONARY ANGIOGRAPHY  Conclusion    Left main contains up to 20% mid body narrowing.  The LAD contains luminal irregularities up to 30% of the proximal vessel and a widely patent stent in the proximal to mid vessel beyond the first diagonal. The first diagonal stent is widely patent. Ostial to proximal is eccentric 70 to 80%.  Circumflex coronary artery contains proximal to mid eccentric 50 to 60% narrowing.  RCA contains luminal irregularities up to 40% throughout the proximal to mid vessel. RCA is dominant.  2-3+ mitral regurgitation by ventriculography. Severe mitral  regurgitation by  transesophageal echo.  Normal left ventricular systolic function with ejection fraction 55%.  Right heart pressures are normal without significant V wave.  RECOMMENDATIONS:   Severe mitral regurgitation by echo in the setting of significant mitral valve prolapse. Patient complains of dyspnea. From hemodynamic and angiographic standpoint mitral regurgitation is is moderate.  Will refer to Dr. Cornelius Moras for consideration of mitral valve repair given symptoms of dyspnea. Symptoms seem out of proportion to hemodynamics but early repair has been shown to be better than after LV enlargement.  Surgeon Notes     03/05/2019 10:44 AM CV Procedure addendum by [name], MD  Procedural Details   Technical Details The right radial area was sterilely prepped and draped. Intravenous sedation with Versed and fentanyl was administered. 1% Xylocaine was infiltrated to achieve local analgesia. Using real-time vascular ultrasound, a double wall stick with an angiocath was utilized to obtain intra-arterial access. A VUS image was saved for the permanent record.The modified Seldinger technique was used to place a 10F " Slender" sheath in the right radial artery. Weight based heparin was administered. Coronary angiography was done using 5 F catheters. Right coronary angiography was performed with a JR4. Left ventricular hemodymic recordings and angiography was done using the 5 French angled pigtail catheter and lower injection. Left coronary angiography was performed with a JL 3.5 cm.  Right heart catheterization was performed by exchanging a previously placed antecubital IV angio-cath for a 5 French Slender sheath. 1% Xylocaine was used to locally nesthetize the area around the IV site. The IV catheter was wired using an .018 guidewire. The modified Seldinger technique was used to place the 5 [place] sheath. Double glove technique was used to enhance sterility. After sheath insertion, right  heart cath was performed using a 5 French balloon tipped catheter and fluoroscopic guidance. Pressures were recorded in each chamber and in the pulmonary capillary wedge position.. The main pulmonary artery O2 saturation was sampled.   Hemostasis was achieved using a pneumatic band.  During this procedure the patient is administered a total of Versed 1.5 mg and Fentanyl 50 mg to achieve and maintain moderate conscious sedation. The patient's heart rate, blood pressure, and oxygen saturation are monitored continuously during the procedure. The period of conscious sedation is 47 minutes, of which I was present face-to-face 100% of this time. Estimated blood loss <50 mL.   During this procedure medications were administered to achieve and maintain moderate conscious sedation while the patient's heart rate, blood pressure, and oxygen saturation were continuously monitored and I was present face-to-face 100% of this time.  Medications  (Filter: Administrations occurring from 03/14/19 0947 to 03/14/19 1109)          Medication Rate/Dose/Volume Action  Date Time   Heparin (Porcine) in NaCl 1000-0.9 UT/500ML-% SOLN (mL) 500 mL Given 03/14/19 0955   Total dose as of 05/14/19 1623 500 mL Given 0955   1,000 mL        fentaNYL (SUBLIMAZE) injection (mcg) 25 mcg Given 03/14/19 1011   Total dose as of 05/14/19 1623 25 mcg Given 1026   50 mcg        midazolam (VERSED) injection (mg) 1 mg Given 03/14/19 1011   Total dose as of 05/14/19 1623 0.5 mg Given 1026   1.5 mg        lidocaine (PF) (XYLOCAINE) 1 % injection (mL) 2 mL Given 03/14/19 1022   Total dose as of 05/14/19 1623 2 mL Given 1031   4 mL  Radial Cocktail/Verapamil only (mL) 10 mL Given 03/14/19 1033   Total dose as of 05/14/19 1623        10 mL        heparin injection (Units) 5,000 Units Given 03/14/19 1039   Total dose as of 05/14/19 1623        5,000 Units         iohexol (OMNIPAQUE) 350 MG/ML injection (mL) 120 mL Given 03/14/19 1100   Total dose as of 05/14/19 1623        120 mL        Sedation Time   Sedation Time Physician-1: 47 minutes 34 seconds  Coronary Findings   Diagnostic  Dominance: Right  Left Main  Ost LM to Mid LM lesion 20% stenosed  Ost LM to Mid LM lesion is 20% stenosed.  Left Anterior Descending  Ost LAD to Prox LAD lesion 30% stenosed  Ost LAD to Prox LAD lesion is 30% stenosed.  Prox LAD lesion 10% stenosed  Prox LAD lesion is 10% stenosed. The lesion was previously treated.  First Diagonal Branch  Ost 1st Diag lesion 80% stenosed  Ost 1st Diag lesion is 80% stenosed.  Ost 1st Diag to 1st Diag lesion 10% stenosed  Ost 1st Diag to 1st Diag lesion is 10% stenosed. The lesion was previously treated.  Left Circumflex  Vessel is small.  Prox Cx lesion 50% stenosed  Prox Cx lesion is 50% stenosed.  First Obtuse Marginal Branch  Vessel is small in size.  Third Obtuse Marginal Branch  Vessel is large in size.  Right Coronary Artery  Prox RCA to Mid RCA lesion 40% stenosed  Prox RCA to Mid RCA lesion is 40% stenosed.  Intervention   No interventions have been documented.  Right Heart   Right Heart Pressures Hemodynamic findings consistent with pulmonary hypertension. LV EDP is normal. Mean wedge pressure is 10 mmHg  Wall Motion        Resting       Ventricular ectopy degrades the quality of ventriculography.          Left Heart   Left Ventricle The left ventricular systolic function is normal. LV end diastolic pressure is normal. The left ventricular ejection fraction is 55-65% by visual estimate.  Mitral Valve There is moderate (3+) mitral regurgitation. MR is 2-3+  Coronary Diagrams   Diagnostic  Dominance: Right     Treatments: Surgery  CARDIOTHORACIC SURGERY OPERATIVE NOTE  Date of Procedure:                05/31/2019  Preoperative Diagnosis:      Severe Mitral  Regurgitation  Postoperative Diagnosis:    Same  Procedure:        Minimally-Invasive Mitral Valve Repair             Complex valvuloplasty including quadrangular resection of posterior leaflet             Sliding leaflet annuloplasty             Autologous pericardial patch augmentation of P1 segment of posterior leaflet             Sorin Memo 4D Ring Annuloplasty (size 34mm, catalog # [id number], serial # [id number])               Surgeon:        Salvatore Decent. Cornelius Moras, MD  Assistant:       Jillyn Hidden, PA-C  Anesthesia:    Kipp Brood, MD  Operative Findings: ? Barlow's type myxomatous degenerative disease ? Bileaflet prolapse with thickened, redundant leaflet tissue ? Multiple elongated primary chordae tendinae without any ruptured chordae tendinae ? Severe thickening and fibrosis of subvalvular apparatus beneath P1 segment of posterior leaflet ? Type II mitral valve dysfunction with severe mitral regurgitation ? Normal left ventricular systolic function ? Trivial residual mitral regurgitation after successful valve repair                    BRIEF CLINICAL NOTE AND INDICATIONS FOR SURGERY  Patient has a 53 year old male with history of mitral valve prolapse and mitral regurgitation, coronary artery disease status post acute non-ST segment elevation myocardial infarction in 2015 treated with PCI and stenting of the diagonal branch,chronic sinusitis,and hyperlipidemia who has been referred for surgical consultation to discuss treatment options for management of severe symptomatic primary mitral regurgitation.  Patient states that he was first noted to have a heart murmur on physical exam by his primary care physician more than 5 years ago. He has been followed regularly ever since. In 2015 he presented with chest discomfort and a near syncopal episode and ruled in for non-ST segment elevation myocardial infarction. He was found to have  high-grade stenosis of the diagonal branch of left anterior descending coronary artery and underwent PCI and stenting. He was noted to have 50 to 60% stenosis of the right coronary artery and otherwise mild nonobstructive disease. He has been followed regularly ever since by Dr. [name]. He states that he has done well clinically until this past December when he noted a relatively sudden change in his exercise tolerance with the development of new onset exertional shortness of breath. Routine follow-up echocardiogram performed February 13, 2019 revealed normal left ventricular function. There was mitral valve prolapse with what was felt to be moderate mitral regurgitation. Because of worsening symptoms the patient underwent TEE on March 05, 2019. This revealed mitral valve prolapse with severe mitral regurgitation. The jet of regurgitation was quite eccentric but the PISAradius measures 0.97 cm corresponding to ER O of 0.33 cm and regurgitant volume calculated 69.6 mL. Patient subsequently underwent diagnostic cardiac catheterization by Dr. [name]. The stent in the diagonal branch remained widely patent although there was 70-80 ostial stenosis of the diagonal branch. There remained what was felt to be nonobstructive coronary artery disease in the remainder of the coronary arteries. Right heart pressures were normal. Large V waves were not seen on wedge tracing.  The patient has been seen in consultation and counseled at length regarding the indications, risks and potential benefits of surgery.  All questions have been answered, and the patient provides full informed consent for the operation as described  Discharge Exam: Blood pressure 122/81, pulse 100, temperature 99.1 F (37.3 C), temperature source Oral, resp. rate 19, height 6\' 1"  (1.854 m), weight 96.2 kg, SpO2 95 %.  General appearance:alert, cooperative andnodistress Neurologic:intact Heart:regular rate and rhythm, S1, S2 normal,  no murmur, click, rub or gallop Lungs:clear to auscultation bilaterally Extremities:Extremities well perfused, palpable DP pulses bilat. Wound:Chest incision and right groin vascular access sites dry and free of any evidence of complication.  Disposition:    Allergies as of 06/05/2019      Reactions   Brilinta [ticagrelor] Other (See Comments)   GI bleed   Daliresp [roflumilast] Other (See Comments)   GI UPSET      Medication List    STOP taking these medications   nitroGLYCERIN 0.4 MG SL tablet Commonly known as:  NITROSTAT     TAKE these medications   acetaminophen 325 MG tablet Commonly known as:  Tylenol Take 2 tablets (650 mg total) by mouth every 4 (four) hours as needed. For pain.   albuterol 108 (90 Base) MCG/ACT inhaler Commonly known as:  ProAir HFA Inhale 2 puffs into the lungs every 6 (six) hours as needed for wheezing or shortness of breath.   amLODipine 5 MG tablet Commonly known as:  NORVASC Take 1 tablet (5 mg total) by mouth daily. Please call and schedule an appointment for further refills 1st attempt   aspirin EC 81 MG tablet Take 81 mg by mouth daily.   cetirizine 10 MG tablet Commonly known as:  ZYRTEC Take 10 mg by mouth daily.   chlorhexidine 0.12 % solution Commonly known as:  PERIDEX Rinse with 15 mls twice daily for 30 seconds. Use after breakfast and at bedtime. Spit out excess. Do not swallow. What changed:    how much to take  how to take this  when to take this   fenofibrate 160 MG tablet Take 160 mg by mouth daily.   fluticasone 0.05 % cream Commonly known as:  CUTIVATE Apply 1 application topically every evening.   fluticasone 50 MCG/ACT nasal spray Commonly known as:  FLONASE Place 1 spray into both nostrils every evening.   metoprolol tartrate 25 MG tablet Commonly known as:  LOPRESSOR Take 0.5 tablets (12.5 mg total) by mouth 2 (two) times daily.   mometasone-formoterol 100-5 MCG/ACT Aero Commonly known as:   DULERA Inhale 1 puff into the lungs 2 (two) times daily.   multivitamin with minerals Tabs tablet Take 1 tablet by mouth daily.   rosuvastatin 5 MG tablet Commonly known as:  CRESTOR Take 1 tablet (5 mg total) by mouth daily.   warfarin 5 MG tablet Commonly known as:  Coumadin Take 1 tablet (5 mg total) by mouth daily. or as directed by the [facility].      Follow-up Information    [facility] [employer]. Go on 06/07/2019.   Specialty:  Cardiology Why:  You have and appointment at the [facility] on Thursday 06/07/19 at 10:45 for a  PT / INR (blood test) Contact information: [address] [place] [address] [phone]       [facility]. Go on 06/08/2019.   Specialty:  Cardiothoracic Surgery Why:  You have an appointment for suture removal at Dr. Orvan July office on Friday 06/08/19 at 11:30am. Contact information: [address] [place], [address] [place] [address] [phone]       Dyann Kief, [place]. Call on 06/19/2019.   Specialty:  Cardiology Why:  A virtual office visit is scheduled for Tuesday 06/19/19 at 10:45am with Jacolyn Reedy, PA-C. Contact information: [address] [place] [address] [phone]        Purcell Nails, MD. Go on 07/09/2019.   Specialty:  Cardiothoracic Surgery Why:  You have an appointment with Dr. Cornelius Moras on Monday 07/09/19 at 9:30 am.  Please arrive 30 minutes early for a chest x-ray to be done at [facility] located on the first floor of the same building.  Contact information: [address] [employer] [address] [place] [address] [phone]          The patient has been discharged on:   1.Beta Blocker:  Yes [ x  ]  No   [   ]                              If No, reason:  2.Ace Inhibitor/ARB: Yes [   ]                                     No  [  x  ]                                      If No, reason:Not indicated  3.Statin:   Yes [ y  ]                  No  [   ]                  If No, reason:  4.Ecasa:  Yes  Cove.Etienne   ]                  No   [   ]                  If No, reason:    Signed: Leary Roca, PA-C 06/05/2019, 9:42 AM

## 2019-06-01 NOTE — Progress Notes (Addendum)
[facility] DAILY ICU PROGRESS NOTE                   [address]            [name] [address]          [phone]   1 Day Post-Op Procedure(s) (LRB): MINIMALLY INVASIVE MITRAL VALVE REPAIR (MVR) Using Memo 4D Ring Size (Right) TRANSESOPHAGEAL ECHOCARDIOGRAM (TEE) (N/A)  Total Length of Stay:  LOS: 1 day   Subjective: Sitting up in bed, awake and alert.  Having some right chest soreness but minimal pain. No major problems since surgery.  Objective: Vital signs in last 24 hours: Temp:  [96.8 F (36 C)-100.9 F (38.3 C)] 100 F (37.8 C) (06/05 0737) Pulse Rate:  [74-93] 82 (06/05 0600) Cardiac Rhythm: Normal sinus rhythm (06/05 0600) Resp:  [12-21] 21 (06/05 0600) BP: (111-132)/(64-87) 123/70 (06/05 0600) SpO2:  [95 %-100 %] 96 % (06/05 0600) Arterial Line BP: (122-164)/(54-73) 164/61 (06/05 0600) Weight:  [102.5 kg] 102.5 kg (06/05 0500)  Filed Weights   05/31/19 0542 06/01/19 0500  Weight: 97.5 kg 102.5 kg    Weight change: 4.977 kg   Hemodynamic parameters for last 24 hours: PAP: (27-41)/(12-23) 41/19 CO:  [4 L/min-7.9 L/min] 7.9 L/min CI:  [1.8 L/min/m2-3.6 L/min/m2] 3.6 L/min/m2  Intake/Output from previous day: 06/04 0701 - 06/05 0700 In: 4648.9 [I.V.:3517.9; Blood:300; IV Piggyback:831] Out: 4835 [Urine:3565; Blood:600; Chest Tube:670]  Intake/Output this shift: No intake/output data recorded.  Current Meds: Scheduled Meds: . acetaminophen  1,000 mg Oral Q6H  . aspirin EC  325 mg Oral Daily  . [START ON 06/02/2019] aspirin EC  81 mg Oral Daily  . bisacodyl  10 mg Oral Daily   Or  . bisacodyl  10 mg Rectal Daily  . Chlorhexidine Gluconate Cloth  6 each Topical Daily  . docusate sodium  200 mg Oral Daily  . [START ON 06/02/2019] enoxaparin (LOVENOX) injection  30 mg Subcutaneous QHS  . insulin aspart  0-24 Units Subcutaneous Q4H  . [START ON 06/02/2019] pantoprazole  40 mg Oral Daily  . [START ON 06/04/2019] rosuvastatin  5 mg Oral Daily   . sodium chloride flush  3 mL Intravenous Q12H  . warfarin  2.5 mg Oral q1800  . Warfarin - Physician Dosing Inpatient   Does not apply q1800   Continuous Infusions: . sodium chloride    . cefUROXime (ZINACEF)  IV 1.5 g (06/01/19 0556)  . lactated ringers    . lactated ringers 20 mL/hr at 06/01/19 0600   PRN Meds:.metoprolol tartrate, morphine injection, ondansetron (ZOFRAN) IV, oxyCODONE, sodium chloride flush, traMADol  General appearance: alert, cooperative and no distress Neurologic: intact Heart: regular rate and rhythm, S1, S2 normal, no murmur, EKG this morning shows normal sinus rhythm Lungs: Breath sounds are clear but diminished.  Chest x-ray reviewed, there is some right lower lobe atelectasis.  Chest tube drainage is tapering off. Abdomen: The abdomen is soft and nontender Extremities: All extremities are well perfused with strongly palpable bilateral pedal pulses.  Vascular access site and right groin is dry, no hematoma.  Minimal bruising. Wound: The right chest incision is covered with Aquacel dressing remains intact and dry.  Lab Results: CBC: Recent Labs    05/31/19 2054 06/01/19 0258  WBC 12.6* 12.6*  HGB 11.0*  10.9* 10.0*  HCT 33.1*  32.0* 29.9*  PLT 195 193   BMET:  Recent Labs    05/31/19 2054 06/01/19 0258  NA  140 138  K 4.0 3.9  CL 108 111  CO2  --  19*  GLUCOSE 121* 112*  BUN 11 12  CREATININE 0.85  0.70 0.87  CALCIUM  --  7.2*    CMET: Lab Results  Component Value Date   WBC 12.6 (H) 06/01/2019   HGB 10.0 (L) 06/01/2019   HCT 29.9 (L) 06/01/2019   PLT 193 06/01/2019   GLUCOSE 112 (H) 06/01/2019   CHOL 185 02/08/2019   TRIG 179 (H) 02/08/2019   HDL 47 02/08/2019   LDLCALC 102 (H) 02/08/2019   ALT 37 05/28/2019   AST 32 05/28/2019   NA 138 06/01/2019   K 3.9 06/01/2019   CL 111 06/01/2019   CREATININE 0.87 06/01/2019   BUN 12 06/01/2019   CO2 19 (L) 06/01/2019   INR 1.6 (H) 05/31/2019   HGBA1C 6.1 (H) 05/28/2019       PT/INR:  Recent Labs    05/31/19 1521  LABPROT 18.7*  INR 1.6*   Radiology: Dg Chest Port 1 View  Result Date: 06/01/2019 CLINICAL DATA:  Status post aortic valve replacement. EXAM: PORTABLE CHEST 1 VIEW COMPARISON:  Radiograph of May 31, 2019. FINDINGS: Stable cardiomediastinal silhouette. Stable left internal jugular Swan-Ganz catheter is noted, with tip in expected position of main pulmonary artery. Left lung is clear. Two right-sided chest tubes are noted without pneumothorax. Stable right basilar atelectasis is noted. IMPRESSION: Stable position of 2 right-sided chest tubes without pneumothorax. Stable right basilar atelectasis is noted. Electronically Signed   By: Lupita RaiderJames  Green Jr M.D.   On: 06/01/2019 07:09   Dg Chest Port 1 View  Result Date: 05/31/2019 CLINICAL DATA:  Status post minimally invasive mitral valve repair EXAM: PORTABLE CHEST 1 VIEW COMPARISON:  05/28/2019 FINDINGS: Postsurgical changes are noted. Swan-Ganz catheter is noted from a left jugular approach. Pericardial drain and right thoracostomy tube are noted. No pneumothorax is seen. Right basilar opacity is noted likely representing atelectasis. No other focal abnormality is noted. IMPRESSION: Postoperative changes as described. Right basilar atelectasis is noted. Electronically Signed   By: Alcide CleverMark  Lukens M.D.   On: 05/31/2019 15:32     Assessment/Plan: S/P Procedure(s) (LRB): MINIMALLY INVASIVE MITRAL VALVE REPAIR (MVR) Using Memo 4D Ring Size 34MM (Right) TRANSESOPHAGEAL ECHOCARDIOGRAM (TEE) (N/A)  -Postop day 1 mini mitral valve repair for severe MR.  He has been hemodynamically stable since surgery.  Cardiac index this morning is greater than 4.  Will DC monitoring lines and mobilize.  Leave chest tubes in place today . Start low-dose Coumadin and monitor INR.  -History of coronary artery disease, status post PTCI to LAD and diagonal with residual nonobstructive disease in the RCA.  Resume aspirin and statin.  -Mild  expected acute blood loss anemia-hematocrit is 30 with minimal ongoing losses.  Monitor daily.  -Endo--not diabetic.  Glucose stable less than 110  -DVT prophylaxis-mobilize, continue SCDs when in bed.  Add enoxaparin postop day 2.   Leary RocaMyron G. Roddenberry, PA-C [phone] 06/01/2019 7:47 AM  I have seen and examined the patient and agree with the assessment and plan as outlined.  Doing very well POD1.  Maintaining NSR w/ stable hemodynamics, no drips.  Breathing comfortably on room air and CXR looks good.  Mobilize.  Diuresis.  D/C lines.  Start Coumadin and low dose beta blocker.  Leave chest tubes in place. Transfer step-down.  Purcell Nailslarence H Owen, MD 06/01/2019 8:42 AM

## 2019-06-01 NOTE — Discharge Instructions (Signed)
Discharge Instructions:  1. You may shower, please wash incisions daily with soap and water and keep dry.  If you wish to cover wounds with dressing you may do so but please keep clean and change daily.  No tub baths or swimming until incisions have completely healed.  If your incisions become red or develop any drainage please call our office at [phone]  2. No Driving until cleared by Dr. Owen's office and you are no longer using narcotic pain medications  3. Monitor your weight daily.. Please use the same scale and weigh at same time... If you gain 5-10 lbs in 48 hours with associated lower extremity swelling, please contact our office at [phone]  4. Fever of 101.5 for at least 24 hours with no source, please contact our office at [phone]  5. Activity- up as tolerated, please walk at least 3 times per day.  Avoid strenuous activity, no lifting, pushing, or pulling with your arms over 8-10 lbs for a minimum of 6 weeks  6. If any questions or concerns arise, please do not hesitate to contact our office at [phone] Information on my medicine - Coumadin   (Warfarin)   Why was Coumadin prescribed for you? Coumadin was prescribed for you because you have a blood clot or a medical condition that can cause an increased risk of forming blood clots. Blood clots can cause serious health problems by blocking the flow of blood to the heart, lung, or brain. Coumadin can prevent harmful blood clots from forming. As a reminder your indication for Coumadin is:   Blood Clot Prevention After Heart Valve Surgery  What test will check on my response to Coumadin? While on Coumadin (warfarin) you will need to have an INR test regularly to ensure that your dose is keeping you in the desired range. The INR (international normalized ratio) number is calculated from the result of the laboratory test called prothrombin time (PT).  If an INR APPOINTMENT HAS NOT ALREADY BEEN MADE FOR YOU please  schedule an appointment to have this lab work done by your health care provider within 7 days. Your INR goal is usually a number between:  2 to 3 or your provider may give you a more narrow range like 2-2.5.  Ask your health care provider during an office visit what your goal INR is.  What  do you need to  know  About  COUMADIN? Take Coumadin (warfarin) exactly as prescribed by your healthcare provider about the same time each day.  DO NOT stop taking without talking to the doctor who prescribed the medication.  Stopping without other blood clot prevention medication to take the place of Coumadin may increase your risk of developing a new clot or stroke.  Get refills before you run out.  What do you do if you miss a dose? If you miss a dose, take it as soon as you remember on the same day then continue your regularly scheduled regimen the next day.  Do not take two doses of Coumadin at the same time.  Important Safety Information A possible side effect of Coumadin (Warfarin) is an increased risk of bleeding. You should call your healthcare provider right away if you experience any of the following: ? Bleeding from an injury or your nose that does not stop. ? Unusual colored urine (red or dark brown) or unusual colored stools (red or black). ? Unusual bruising for unknown reasons. ? A serious fall or if you hit your head (even if   there is no bleeding).  Some foods or medicines interact with Coumadin (warfarin) and might alter your response to warfarin. To help avoid this: ? Eat a balanced diet, maintaining a consistent amount of Vitamin K. ? Notify your provider about major diet changes you plan to make. ? Avoid alcohol or limit your intake to 1 drink for women and 2 drinks for men per day. (1 drink is 5 oz. wine, 12 oz. beer, or 1.5 oz. liquor.)  Make sure that ANY health care provider who prescribes medication for you knows that you are taking Coumadin (warfarin).  Also make sure the  healthcare provider who is monitoring your Coumadin knows when you have started a new medication including herbals and non-prescription products.  Coumadin (Warfarin)  Major Drug Interactions  Increased Warfarin Effect Decreased Warfarin Effect  Alcohol (large quantities) Antibiotics (esp. Septra/Bactrim, Flagyl, Cipro) Amiodarone (Cordarone) Aspirin (ASA) Cimetidine (Tagamet) Megestrol (Megace) NSAIDs (ibuprofen, naproxen, etc.) Piroxicam (Feldene) Propafenone (Rythmol SR) Propranolol (Inderal) Isoniazid (INH) Posaconazole (Noxafil) Barbiturates (Phenobarbital) Carbamazepine (Tegretol) Chlordiazepoxide (Librium) Cholestyramine (Questran) Griseofulvin Oral Contraceptives Rifampin Sucralfate (Carafate) Vitamin K   Coumadin (Warfarin) Major Herbal Interactions  Increased Warfarin Effect Decreased Warfarin Effect  Garlic Ginseng Ginkgo biloba Coenzyme Q10 Green tea St. John's wort    Coumadin (Warfarin) FOOD Interactions  Eat a consistent number of servings per week of foods HIGH in Vitamin K (1 serving =  cup)  Collards (cooked, or boiled & drained) Kale (cooked, or boiled & drained) Mustard greens (cooked, or boiled & drained) Parsley *serving size only =  cup Spinach (cooked, or boiled & drained) Swiss chard (cooked, or boiled & drained) Turnip greens (cooked, or boiled & drained)  Eat a consistent number of servings per week of foods MEDIUM-HIGH in Vitamin K (1 serving = 1 cup)  Asparagus (cooked, or boiled & drained) Broccoli (cooked, boiled & drained, or raw & chopped) Brussel sprouts (cooked, or boiled & drained) *serving size only =  cup Lettuce, raw (green leaf, endive, romaine) Spinach, raw Turnip greens, raw & chopped   These websites have more information on Coumadin (warfarin):  www.coumadin.com; www.ahrq.gov/consumer/coumadin.htm;    

## 2019-06-01 NOTE — Anesthesia Postprocedure Evaluation (Signed)
Anesthesia Post Note  Patient: Calvin [name]  Procedure(s) Performed: MINIMALLY INVASIVE MITRAL VALVE REPAIR (MVR) Using Memo 4D Ring Size (Right Chest) TRANSESOPHAGEAL ECHOCARDIOGRAM (TEE) (N/A )     Patient location during evaluation: SICU Anesthesia Type: General Level of consciousness: awake and alert Pain management: pain level controlled Vital Signs Assessment: post-procedure vital signs reviewed and stable Respiratory status: spontaneous breathing, nonlabored ventilation, respiratory function stable and patient connected to nasal cannula oxygen Cardiovascular status: blood pressure returned to baseline and stable Postop Assessment: no apparent nausea or vomiting Anesthetic complications: no    Last Vitals:  Vitals:   06/01/19 0930 06/01/19 1024  BP: 118/64 119/71  Pulse: 82 80  Resp: (!) 21 20  Temp:  37.3 C  SpO2: 92% 94%    Last Pain:  Vitals:   06/01/19 1024  TempSrc: Oral  PainSc: 0-No pain                 Colbi Staubs COKER

## 2019-06-01 NOTE — Progress Notes (Signed)
CARDIAC REHAB PHASE I   PRE:  Rate/Rhythm: 75 first deg    BP: sitting 116/70    SaO2: 96 RA  MODE:  Ambulation: 370 ft   POST:  Rate/Rhythm: 87 SR    BP: sitting 130/72     SaO2: 96 RA  Pt feeling well. Able to move to EOB and stand and walk independently with RW. Fairly steady but c/o wooziness. First walk. No major c/o. Return to bed with head elevated.  [id number]   Harriet Masson CES, ACSM 06/01/2019 3:18 PM

## 2019-06-01 NOTE — Progress Notes (Signed)
Anesthesiology Follow-up:  Awake and alert, neuro intact, hemodynamically stable, breathing unlabored, mild incisional pain.  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 Extubated in OR stable post-op course , no apparent complications.  Calvin [name]

## 2019-06-02 ENCOUNTER — Inpatient Hospital Stay (HOSPITAL_COMMUNITY): Payer: BC Managed Care – PPO

## 2019-06-02 LAB — BASIC METABOLIC PANEL
Anion gap: 8 (ref 5–15)
BUN: 14 mg/dL (ref 6–20)
CO2: 23 mmol/L (ref 22–32)
Calcium: 8.2 mg/dL — ABNORMAL LOW (ref 8.9–10.3)
Chloride: 111 mmol/L (ref 98–111)
Creatinine, Ser: 1.05 mg/dL (ref 0.61–1.24)
GFR calc Af Amer: >60 mL/min (ref >60)
GFR calc non Af Amer: >60 mL/min (ref >60)
Glucose, Bld: 113 mg/dL — ABNORMAL HIGH (ref 70–99)
Potassium: 3.8 mmol/L (ref 3.5–5.1)
Sodium: 142 mmol/L (ref 135–145)

## 2019-06-02 LAB — CBC
HCT: 30.3 % — ABNORMAL LOW (ref 39.0–52.0)
Hemoglobin: 10 g/dL — ABNORMAL LOW (ref 13.0–17.0)
MCH: 29.2 pg (ref 26.0–34.0)
MCHC: 33 g/dL (ref 30.0–36.0)
MCV: 88.6 fL (ref 80.0–100.0)
Platelets: 164 10*3/uL (ref 150–400)
RBC: 3.42 MIL/uL — ABNORMAL LOW (ref 4.22–5.81)
RDW: 13.9 % (ref 11.5–15.5)
WBC: 12.7 10*3/uL — ABNORMAL HIGH (ref 4.0–10.5)
nRBC: 0 % (ref 0.0–0.2)

## 2019-06-02 LAB — URINALYSIS, ROUTINE W REFLEX MICROSCOPIC
Bilirubin Urine: NEGATIVE
Glucose, UA: NEGATIVE mg/dL
Hgb urine dipstick: NEGATIVE
Ketones, ur: NEGATIVE mg/dL
Leukocytes,Ua: NEGATIVE
Nitrite: NEGATIVE
Protein, ur: NEGATIVE mg/dL
Specific Gravity, Urine: 1.013 (ref 1.005–1.030)
pH: 8 (ref 5.0–8.0)

## 2019-06-02 LAB — PROTIME-INR
INR: 1.4 — ABNORMAL HIGH (ref 0.8–1.2)
Prothrombin Time: 16.8 seconds — ABNORMAL HIGH (ref 11.4–15.2)

## 2019-06-02 MED ORDER — POTASSIUM CHLORIDE CRYS ER 20 MEQ PO TBCR
20.0000 meq | EXTENDED_RELEASE_TABLET | Freq: Once | ORAL | Status: AC
  Administered 2019-06-02: 20 meq via ORAL
  Filled 2019-06-02: qty 1

## 2019-06-02 NOTE — Progress Notes (Signed)
Patient spouse called and updated on patient. All questions answered. Will monitor patient. Yarely Bebee, Bettina Gavia rN

## 2019-06-02 NOTE — Progress Notes (Signed)
CARDIAC REHAB PHASE I   PRE:  Rate/Rhythm: 75 ? First deg (PR elongates then hidden in T ?)    BP: sitting 129/79    SaO2: 95 RA  MODE:  Ambulation: 470 ft   POST:  Rate/Rhythm: 91 ? Very long first deg (do not see P wave)    BP: sitting 134/74     SaO2: 89-92 RA  Pt with strange rhythm this am. Tolerated well, without c/o except SOB toward end of walk. Steady with RW, independent. Return to recliner. Encouraged IS, which he is hitting 2250 mL. Encouraged x3 walks qd. [id number]   [place], ACSM 06/02/2019 10:20 AM

## 2019-06-02 NOTE — Progress Notes (Signed)
Patient with a 102.4 and 102.1 temp respectively. Lars Pinks [facility] made aware and orders received. Will monitor patient. Ogden Handlin, Bettina Gavia RN

## 2019-06-02 NOTE — Progress Notes (Addendum)
      [address]       [place] [address]             [phone]        2 Days Post-Op Procedure(s) (LRB): MINIMALLY INVASIVE MITRAL VALVE REPAIR (MVR) Using Memo 4D Ring Size 34MM (Right) TRANSESOPHAGEAL ECHOCARDIOGRAM (TEE) (N/A)  Subjective: Patient sitting in chair, eating breakfast this am.  Objective: Vital signs in last 24 hours: Temp:  [98.7 F (37.1 C)-100 F (37.8 C)] 100 F (37.8 C) (06/06 0800) Pulse Rate:  [79-91] 91 (06/05 2333) Cardiac Rhythm: Normal sinus rhythm;Heart block (06/06 0813) Resp:  [18-21] 19 (06/06 0800) BP: (109-144)/(64-94) 130/85 (06/06 0800) SpO2:  [91 %-96 %] 91 % (06/06 0431) Weight:  [101.2 kg-106.5 kg] 101.2 kg (06/06 0431)  Pre op weight 97.5 kg Current Weight  06/02/19 101.2 kg      Intake/Output from previous day: 06/05 0701 - 06/06 0700 In: 357.3 [I.V.:107.3; IV Piggyback:250] Out: 4332 [Urine:1525; Chest Tube:325]   Physical Exam:  Cardiovascular: RRR, no murmur Pulmonary: Diminished bibasilar breath sounds R>L Abdomen: Soft, non tender, bowel sounds present. Extremities: Mild bilateral lower extremity edema. Wounds: Aquacel intact  Lab Results: CBC: Recent Labs    06/01/19 1622 06/02/19 0308  WBC 11.5* 12.7*  HGB 10.0* 10.0*  HCT 30.8* 30.3*  PLT 168 164   BMET:  Recent Labs    06/01/19 1622 06/02/19 0308  NA 139 142  K 4.1 3.8  CL 109 111  CO2 23 23  GLUCOSE 162* 113*  BUN 15 14  CREATININE 1.11 1.05  CALCIUM 8.3* 8.2*    PT/INR:  Lab Results  Component Value Date   INR 1.4 (H) 06/02/2019   INR 1.6 (H) 05/31/2019   INR 1.1 05/28/2019   ABG:  INR: Will add last result for INR, ABG once components are confirmed Will add last 4 CBG results once components are confirmed  Assessment/Plan:  1. CV - Had a missed beat and questionable brief a fib (P waves evident) earlier this am. First degree heart block, ? SR in the 80s at time of exam. On Lopressor 12.5 mg bid and  Coumadin. INR 1.4 this am. 2.  Pulmonary - On room air. Chest tubes to suction. Chest tubes with 325 cc last 24 hours. CXR this am shows bibasilar consolidation, no pneumothorax. Chest tubes to remain for now. Encourage incentive spirometer 3. Volume Overload - On Lasix 40 mg bid 4.  Acute blood loss anemia - H and H this am stable at 10 and 30.3 5. Tmax 100. WBC 12,700. Likely related to SIRS/atelectasis. 6. Supplement potassium  Donielle M ZimmermanPA-C 06/02/2019,8:54 AM [phone]  Patient seen and examined, agree with above Reviewed rhythm with Dr. Marlou Porch. He is having type 1 second degree block not atrial fibrillation. Will dc lopressor and observe Keep CT until output down  Remo Lipps C. Roxan Hockey, MD [facility] [phone]

## 2019-06-02 NOTE — Progress Notes (Signed)
Encouraged ambulation, patient declines at this time. Assisted back to bed from bathroom. Will monitor patient. Trezure Cronk, Bettina Gavia RN

## 2019-06-02 NOTE — Progress Notes (Signed)
Patient ambulated in hallway with walker and nursing staff. Patient ambulated 250 feet. Back in room call bell within reach. Will monitor patient. Xavi Tomasik, Bettina Gavia RN

## 2019-06-03 ENCOUNTER — Inpatient Hospital Stay (HOSPITAL_COMMUNITY): Payer: BC Managed Care – PPO

## 2019-06-03 LAB — CBC
HCT: 29.4 % — ABNORMAL LOW (ref 39.0–52.0)
Hemoglobin: 9.8 g/dL — ABNORMAL LOW (ref 13.0–17.0)
MCH: 29.4 pg (ref 26.0–34.0)
MCHC: 33.3 g/dL (ref 30.0–36.0)
MCV: 88.3 fL (ref 80.0–100.0)
Platelets: 169 10*3/uL (ref 150–400)
RBC: 3.33 MIL/uL — ABNORMAL LOW (ref 4.22–5.81)
RDW: 13.7 % (ref 11.5–15.5)
WBC: 14.2 10*3/uL — ABNORMAL HIGH (ref 4.0–10.5)
nRBC: 0 % (ref 0.0–0.2)

## 2019-06-03 LAB — PROTIME-INR
INR: 1.3 — ABNORMAL HIGH (ref 0.8–1.2)
Prothrombin Time: 16.1 seconds — ABNORMAL HIGH (ref 11.4–15.2)

## 2019-06-03 NOTE — Progress Notes (Addendum)
      [address]       [place] [address]             [phone]        3 Days Post-Op Procedure(s) (LRB): MINIMALLY INVASIVE MITRAL VALVE REPAIR (MVR) Using Memo 4D Ring Size 34MM (Right) TRANSESOPHAGEAL ECHOCARDIOGRAM (TEE) (N/A)  Subjective: Patient had bowel movement yesterday.  Objective: Vital signs in last 24 hours: Temp:  [98.6 F (37 C)-102.4 F (39.1 C)] 98.7 F (37.1 C) (06/07 0443) Pulse Rate:  [81-100] 93 (06/06 2000) Cardiac Rhythm: Normal sinus rhythm;Heart block (06/06 1901) Resp:  [16-19] 19 (06/07 0443) BP: (115-147)/(72-87) 122/84 (06/07 0443) SpO2:  [91 %-94 %] 92 % (06/07 0443) Weight:  [97.3 kg] 97.3 kg (06/07 0443)  Pre op weight 97.5 kg Current Weight  06/03/19 97.3 kg      Intake/Output from previous day: 06/06 0701 - 06/07 0700 In: -  Out: 290 [Chest Tube:290]   Physical Exam:  Cardiovascular: RRR, no murmur Pulmonary: Diminished bibasilar breath sounds R>L Abdomen: Soft, non tender, bowel sounds present. Extremities: Mild bilateral lower extremity edema. Wounds: Aquacel removed and wound is clean, dry, no sign of infection. Chest tube dressing with bloody like ooze  Lab Results: CBC: Recent Labs    06/02/19 0308 06/03/19 0303  WBC 12.7* 14.2*  HGB 10.0* 9.8*  HCT 30.3* 29.4*  PLT 164 169   BMET:  Recent Labs    06/01/19 1622 06/02/19 0308  NA 139 142  K 4.1 3.8  CL 109 111  CO2 23 23  GLUCOSE 162* 113*  BUN 15 14  CREATININE 1.11 1.05  CALCIUM 8.3* 8.2*    PT/INR:  Lab Results  Component Value Date   INR 1.3 (H) 06/03/2019   INR 1.4 (H) 06/02/2019   INR 1.6 (H) 05/31/2019   ABG:  INR: Will add last result for INR, ABG once components are confirmed Will add last 4 CBG results once components are confirmed  Assessment/Plan:  1. CV - Had AVB yesterday (second degree, type 1) previously. Lopressor stopped yesterday. HR in the 90's this am.On Coumadin. INR 1.3 this am. If INR does not  increase in am, will increase Coumadin. 2.  Pulmonary - On room air. Chest tubes to suction. Chest tubes with 290 cc last 24 hours. CXR this am shows patient rotated to the left, bibasilar consolidation, no pneumothorax. Chest tube output decreasing but to remain for now. Encourage incentive spirometer and flutter valve. 3. Volume Overload - On Lasix 40 mg bid 4.  Acute blood loss anemia - H and H this am stable at 9.8 and 29.4 5. Tmax 102. WBC 14,200. Await blood culture, UA negative, no evidence of wound infection. Monitor for now but if with further fever, will start empiric antibiotics   Donielle M ZimmermanPA-C 06/03/2019,7:58 AM [phone]  Patient seen and examined, agree with above No evident source of fever, does have some atelectasis in the right lower lobe associated with his chest tube, but would not expect that degree of a fever. Will add a flutter valve  Remo Lipps C. Roxan Hockey, MD [facility] [phone]

## 2019-06-03 NOTE — Progress Notes (Signed)
Patient ambulated in hallway 1000 feet with walker and nursing staff. Patient back in room. Shakya Sebring, Bettina Gavia rN

## 2019-06-03 NOTE — Progress Notes (Signed)
Patient ambulated in Hallway 1000 feet with walker and nursing staff. Patient tolerated ambulation well. Patient back in room. Call light within reach.

## 2019-06-04 ENCOUNTER — Inpatient Hospital Stay (HOSPITAL_COMMUNITY): Payer: BC Managed Care – PPO

## 2019-06-04 LAB — CBC
HCT: 30.9 % — ABNORMAL LOW (ref 39.0–52.0)
Hemoglobin: 10.3 g/dL — ABNORMAL LOW (ref 13.0–17.0)
MCH: 29.2 pg (ref 26.0–34.0)
MCHC: 33.3 g/dL (ref 30.0–36.0)
MCV: 87.5 fL (ref 80.0–100.0)
Platelets: 242 10*3/uL (ref 150–400)
RBC: 3.53 MIL/uL — ABNORMAL LOW (ref 4.22–5.81)
RDW: 13.6 % (ref 11.5–15.5)
WBC: 12.2 10*3/uL — ABNORMAL HIGH (ref 4.0–10.5)
nRBC: 0 % (ref 0.0–0.2)

## 2019-06-04 LAB — BASIC METABOLIC PANEL
Anion gap: 9 (ref 5–15)
BUN: 15 mg/dL (ref 6–20)
CO2: 22 mmol/L (ref 22–32)
Calcium: 8.5 mg/dL — ABNORMAL LOW (ref 8.9–10.3)
Chloride: 107 mmol/L (ref 98–111)
Creatinine, Ser: 1.03 mg/dL (ref 0.61–1.24)
GFR calc Af Amer: >60 mL/min (ref >60)
GFR calc non Af Amer: >60 mL/min (ref >60)
Glucose, Bld: 106 mg/dL — ABNORMAL HIGH (ref 70–99)
Potassium: 3.7 mmol/L (ref 3.5–5.1)
Sodium: 138 mmol/L (ref 135–145)

## 2019-06-04 LAB — PROTIME-INR
INR: 1.3 — ABNORMAL HIGH (ref 0.8–1.2)
Prothrombin Time: 15.7 seconds — ABNORMAL HIGH (ref 11.4–15.2)

## 2019-06-04 MED ORDER — WARFARIN SODIUM 5 MG PO TABS
5.0000 mg | ORAL_TABLET | Freq: Once | ORAL | Status: AC
  Administered 2019-06-04: 17:00:00 5 mg via ORAL
  Filled 2019-06-04: qty 1

## 2019-06-04 MED FILL — Magnesium Sulfate Inj 50%: INTRAMUSCULAR | Qty: 10 | Status: AC

## 2019-06-04 MED FILL — Potassium Chloride Inj 2 mEq/ML: INTRAVENOUS | Qty: 40 | Status: AC

## 2019-06-04 MED FILL — Heparin Sodium (Porcine) Inj 1000 Unit/ML: INTRAMUSCULAR | Qty: 30 | Status: AC

## 2019-06-04 NOTE — Progress Notes (Addendum)
Patient Chest tubes removed as ordered after pacing wire removal. Patient tolerated well and dressing applied. Spoke with Antony Odea. PA-C and he stated to remove On-Q pain pump. On Q pain pump removed as ordered. Patient resting in bed. Will continue to monitor patient. Florencia Zaccaro, Bettina Gavia rN

## 2019-06-04 NOTE — Progress Notes (Signed)
CARDIAC REHAB PHASE I   PRE:  Rate/Rhythm: 93 first deg    BP: sitting 122/61    SaO2: 95 RA  MODE:  Ambulation: 790 ft   POST:  Rate/Rhythm: 114 ST    BP: sitting 148/78     SaO2: 98 RA  Pt doing well. He stood up quickly and began walking and became dizzy. Had to stop to rest. Then tolerated rest of walk well at quicker pace, no RW. To recliner. Encouraged IS/flutter and x2 more walks today after CT out. [place], ACSM 06/04/2019 9:47 AM

## 2019-06-04 NOTE — Progress Notes (Signed)
Patient EPW pulled per protocol and as ordered. 3 wires on left chest removed all ends intact some resistance met . One wire on rt side of chest. Removed with out difficulty. End intact. Patient Reminded to lie supine approximately one hour. Will continue to monitor patient BP 116/74 heart rate 74 on monitor. Krystn Dermody, Bettina Gavia RN

## 2019-06-04 NOTE — Progress Notes (Signed)
Patient ambulated in hallway with nursing staff. Patient tolerated well will monitor patient. [name], Bettina Gavia rN

## 2019-06-04 NOTE — Progress Notes (Addendum)
4 Days Post-Op Procedure(s) (LRB): MINIMALLY INVASIVE MITRAL VALVE REPAIR (MVR) Using Memo 4D Ring Size 34MM (Right) TRANSESOPHAGEAL ECHOCARDIOGRAM (TEE) (N/A) Subjective: Awake and alert, says he feels well. On RA, doing well with ambulation, tolerating PO's, having appropriate bowel function.  Objective: Vital signs in last 24 hours: Temp:  [98.6 F (37 C)-101.2 F (38.4 C)] 98.6 F (37 C) (06/08 0747) Pulse Rate:  [58-109] 96 (06/08 0747) Cardiac Rhythm: Sinus tachycardia;Normal sinus rhythm (06/08 0747) Resp:  [14-23] 22 (06/08 0747) BP: (111-133)/(70-87) 111/70 (06/08 0747) SpO2:  [87 %-100 %] 93 % (06/08 0747) Weight:  [97.1 kg] 97.1 kg (06/08 0500)   Intake/Output from previous day: 06/07 0701 - 06/08 0700 In: -  Out: 260 [Chest Tube:260] Intake/Output this shift: No intake/output data recorded.  General appearance: alert, cooperative and no distress Neurologic: intact Heart: regular rate and rhythm, S1, S2 normal, no murmur, click, rub or gallop Lungs: clear to auscultation bilaterally Extremities: Extremities well perfused, palpable DP pulses bilat. Wound: Chest incision and right groin vascular access sites dry and free of any evidence of complication.    EXAM: PORTABLE CHEST 1 VIEW  COMPARISON:  06/03/2019  FINDINGS: Right-sided chest tube is again identified and stable. Pericardial drain is noted as well. Changes of recent mitral valve repair are noted. Some right basilar atelectatic changes are seen. No definitive pneumothorax is noted. Left lung remains clear.  IMPRESSION: Stable appearance of the chest when compared with the previous day.   Electronically Signed   By: Inez Catalina M.D.   On: 06/04/2019 07:59  Lab Results: Recent Labs    06/03/19 0303 06/04/19 0449  WBC 14.2* 12.2*  HGB 9.8* 10.3*  HCT 29.4* 30.9*  PLT 169 242   BMET:  Recent Labs    06/02/19 0308 06/04/19 0449  NA 142 138  K 3.8 3.7  CL 111 107  CO2 23 22   GLUCOSE 113* 106*  BUN 14 15  CREATININE 1.05 1.03  CALCIUM 8.2* 8.5*    PT/INR:  Recent Labs    06/04/19 0449  LABPROT 15.7*  INR 1.3*   ABG    Component Value Date/Time   PHART 7.321 (L) 05/31/2019 1626   HCO3 17.6 (L) 05/31/2019 1626   TCO2 20 (L) 05/31/2019 2054   ACIDBASEDEF 8.0 (H) 05/31/2019 1626   O2SAT 94.0 05/31/2019 1626   CBG (last 3)  No results for input(s): GLUCAP in the last 72 hours.  Assessment/Plan: S/P Procedure(s) (LRB): MINIMALLY INVASIVE MITRAL VALVE REPAIR (MVR) Using Memo 4D Ring Size 34MM (Right) TRANSESOPHAGEAL ECHOCARDIOGRAM (TEE) (N/A)  -POD4 mini mitral valve repair. Continue to make a progressive recovery. Will remove  pacer wires and chest tubes this AM. PA/LAT CXR in AM.  Coumadin dosed at 2.5mg  x 3. Will give Coumadin 5mg  tonight.  -Fever-no evidence of infection clinically. WBC is trending down.  Continue to encourage ambulation and pulmonary hygiene.   -Second degree AVB- metoprolol held, now back in NSR.   -Expect volume excess- good response to diuresis. Near pre-op weight.   -Possible discharge to home tomorrow if progress continues and fever resolves.   LOS: 4 days    Antony Odea. PA-C [phone] 06/04/2019   I have seen and examined the patient and agree with the assessment and plan as outlined.  Looks good.  D/C wires and tubes today.  Possible D/C home tomorrow.  Rexene Alberts, MD 06/04/2019 9:06 AM

## 2019-06-05 ENCOUNTER — Inpatient Hospital Stay (HOSPITAL_COMMUNITY): Payer: BC Managed Care – PPO

## 2019-06-05 LAB — AEROBIC/ANAEROBIC CULTURE W GRAM STAIN (SURGICAL/DEEP WOUND): Culture: NO GROWTH

## 2019-06-05 LAB — PROTIME-INR
INR: 1.5 — ABNORMAL HIGH (ref 0.8–1.2)
Prothrombin Time: 17.5 seconds — ABNORMAL HIGH (ref 11.4–15.2)

## 2019-06-05 MED ORDER — METOPROLOL TARTRATE 12.5 MG HALF TABLET
12.5000 mg | ORAL_TABLET | Freq: Two times a day (BID) | ORAL | Status: DC
  Administered 2019-06-05: 09:00:00 12.5 mg via ORAL
  Filled 2019-06-05: qty 1

## 2019-06-05 MED ORDER — METOPROLOL TARTRATE 25 MG PO TABS: 12.5000 mg | ORAL_TABLET | Freq: Two times a day (BID) | ORAL | 2 refills | Status: DC

## 2019-06-05 MED ORDER — WARFARIN SODIUM 5 MG PO TABS: 5.0000 mg | ORAL_TABLET | Freq: Every day | ORAL | 11 refills | Status: DC

## 2019-06-05 MED ORDER — ACETAMINOPHEN 325 MG PO TABS: 650.0000 mg | ORAL_TABLET | ORAL | 2 refills | Status: DC | PRN

## 2019-06-05 MED FILL — Lidocaine HCl Local Soln Prefilled Syringe 100 MG/5ML (2%): INTRAMUSCULAR | Qty: 25 | Status: AC

## 2019-06-05 MED FILL — Heparin Sodium (Porcine) Inj 1000 Unit/ML: INTRAMUSCULAR | Qty: 30 | Status: AC

## 2019-06-05 MED FILL — Electrolyte-R (PH 7.4) Solution: INTRAVENOUS | Qty: 3000 | Status: AC

## 2019-06-05 MED FILL — Mannitol IV Soln 20%: INTRAVENOUS | Qty: 1000 | Status: AC

## 2019-06-05 MED FILL — Sodium Chloride IV Soln 0.9%: INTRAVENOUS | Qty: 2000 | Status: AC

## 2019-06-05 NOTE — Progress Notes (Signed)
D/C instructions given to pt. Medications and wound care reviewed. All questions answered. IV removed, clean and intact. Wife to escort pt home.  Shakeitha Umbaugh, RN  

## 2019-06-05 NOTE — Progress Notes (Addendum)
5 Days Post-Op Procedure(s) (LRB): MINIMALLY INVASIVE MITRAL VALVE REPAIR (MVR) Using Memo 4D Ring Size 34MM (Right) TRANSESOPHAGEAL ECHOCARDIOGRAM (TEE) (N/A) Subjective: Rested well, no complaints or issues. Says pain is controlled with Tylenol alone. Independent with mobility. He feels ready to return home.  Objective: Vital signs in last 24 hours: Temp:  [98.4 F (36.9 C)-100 F (37.8 C)] 99.6 F (37.6 C) (06/09 0603) Pulse Rate:  [37-118] 102 (06/09 0603) Cardiac Rhythm: Normal sinus rhythm (06/09 0603) Resp:  [11-28] 15 (06/09 0603) BP: (105-139)/(68-92) 122/92 (06/09 0603) SpO2:  [69 %-100 %] 95 % (06/09 0603) Weight:  [96.2 kg] 96.2 kg (06/09 0500)    Intake/Output from previous day: 06/08 0701 - 06/09 0700 In: 1080 [P.O.:1080] Out: -  Intake/Output this shift: No intake/output data recorded.  General appearance: alert, cooperative and no distress Neurologic: intact Heart: regular rate and rhythm, S1, S2 normal, no murmur, click, rub or gallop Lungs: clear to auscultation bilaterally Extremities: Extremities well perfused, palpable DP pulses bilat. Wound: Chest incision and right groin vascular access sites dry and free of any evidence of complication.   Lab Results: Recent Labs    06/03/19 0303 06/04/19 0449  WBC 14.2* 12.2*  HGB 9.8* 10.3*  HCT 29.4* 30.9*  PLT 169 242   BMET:  Recent Labs    06/04/19 0449  NA 138  K 3.7  CL 107  CO2 22  GLUCOSE 106*  BUN 15  CREATININE 1.03  CALCIUM 8.5*    PT/INR:  Recent Labs    06/05/19 0240  LABPROT 17.5*  INR 1.5*   ABG    Component Value Date/Time   PHART 7.321 (L) 05/31/2019 1626   HCO3 17.6 (L) 05/31/2019 1626   TCO2 20 (L) 05/31/2019 2054   ACIDBASEDEF 8.0 (H) 05/31/2019 1626   O2SAT 94.0 05/31/2019 1626   CBG (last 3)  No results for input(s): GLUCAP in the last 72 hours.  Assessment/Plan: S/P Procedure(s) (LRB): MINIMALLY INVASIVE MITRAL VALVE REPAIR (MVR) Using Memo 4D Ring Size  34MM (Right) TRANSESOPHAGEAL ECHOCARDIOGRAM (TEE) (N/A)  -POD5 mini mitral valve repair. Continue to make a progressive recovery. Minimal pain, good mobility, no significant arrhythmias.    Coumadin dosed at 2.5mg  x 3 and 5mg  x 1. INR 1.5.  Plan to discharge later today on Coumadin 5mg  po daily. To [facility] on Thursday.  -Fever-Temp curve flat over past 24 hours. No evidence of infection clinically.   -HTN- mild, resume amlodipine at discharge.  -Second degree AVB- metoprolol held, now back in NSR in mid 90's.  Considering adding back low-dose metoprolol.    -Expected volume excess- good response to diuresis. Urine output not recorded past 24 hours but current weight well below pre-op weight.  -Plan discharge later today if CXR OK.    LOS: 5 days   I have seen and examined the patient and agree with the assessment and plan as outlined.  Resume low dose metoprolol.  D/C home today.  Instructions given.  Rexene Alberts, MD 06/05/2019 12:35 PM    Antony Odea, PA-C [phone] 06/05/2019

## 2019-06-05 NOTE — Progress Notes (Signed)
Discussed IS/flutter, restrictions, exercise, HH diet, and CRPII. Good reception, not interested in [place].  [phone] Yves Dill CES, ACSM 9:50 AM 06/05/2019

## 2019-06-07 ENCOUNTER — Ambulatory Visit (INDEPENDENT_AMBULATORY_CARE_PROVIDER_SITE_OTHER): Payer: BC Managed Care – PPO | Admitting: *Deleted

## 2019-06-07 ENCOUNTER — Other Ambulatory Visit: Payer: Self-pay

## 2019-06-07 DIAGNOSIS — I824Y9 Acute embolism and thrombosis of unspecified deep veins of unspecified proximal lower extremity: Secondary | ICD-10-CM | POA: Diagnosis not present

## 2019-06-07 DIAGNOSIS — D6859 Other primary thrombophilia: Secondary | ICD-10-CM | POA: Diagnosis not present

## 2019-06-07 DIAGNOSIS — Z7901 Long term (current) use of anticoagulants: Secondary | ICD-10-CM | POA: Diagnosis not present

## 2019-06-07 DIAGNOSIS — I4891 Unspecified atrial fibrillation: Secondary | ICD-10-CM | POA: Diagnosis not present

## 2019-06-07 LAB — CULTURE, BLOOD (ROUTINE X 2)
Culture: NO GROWTH
Culture: NO GROWTH
Special Requests: ADEQUATE
Special Requests: ADEQUATE

## 2019-06-07 LAB — POCT INR: INR: 1.3 — AB (ref 2.0–3.0)

## 2019-06-07 NOTE — Patient Instructions (Addendum)
  Description   Take 1.5 tablets today, then start taking 1 tablet daily, except for 1/2 tablet on Sundays, Tuesdays, and Thursdays. Be consistent with 5 servings of dark green leafy vegetables per week.  Recheck in 1 week. [facility] [phone] and [facility] [phone]        A full discussion of the nature of anticoagulants has been carried out.  A benefit risk analysis has been presented to the patient, so that they understand the justification for choosing anticoagulation at this time. The need for frequent and regular monitoring, precise dosage adjustment and compliance is stressed.  Side effects of potential bleeding are discussed.  The patient should avoid any OTC items containing aspirin or ibuprofen, and should avoid great swings in general diet.  Avoid alcohol consumption.  Call if any signs of abnormal bleeding; telephone follow up thereafter.

## 2019-06-08 ENCOUNTER — Encounter (INDEPENDENT_AMBULATORY_CARE_PROVIDER_SITE_OTHER): Payer: Self-pay

## 2019-06-08 DIAGNOSIS — Z4802 Encounter for removal of sutures: Secondary | ICD-10-CM

## 2019-06-11 ENCOUNTER — Emergency Department (HOSPITAL_COMMUNITY): Payer: BC Managed Care – PPO

## 2019-06-11 ENCOUNTER — Emergency Department (HOSPITAL_BASED_OUTPATIENT_CLINIC_OR_DEPARTMENT_OTHER): Payer: BC Managed Care – PPO

## 2019-06-11 ENCOUNTER — Observation Stay (HOSPITAL_COMMUNITY)
Admission: EM | Admit: 2019-06-11 | Discharge: 2019-06-12 | Disposition: A | Discharge status: Home / Self Care | Payer: BC Managed Care – PPO | Attending: Thoracic Surgery (Cardiothoracic Vascular Surgery) | Admitting: Thoracic Surgery (Cardiothoracic Vascular Surgery)

## 2019-06-11 DIAGNOSIS — R0789 Other chest pain: Secondary | ICD-10-CM

## 2019-06-11 DIAGNOSIS — I2510 Atherosclerotic heart disease of native coronary artery without angina pectoris: Secondary | ICD-10-CM | POA: Insufficient documentation

## 2019-06-11 DIAGNOSIS — I340 Nonrheumatic mitral (valve) insufficiency: Secondary | ICD-10-CM | POA: Diagnosis not present

## 2019-06-11 DIAGNOSIS — R079 Chest pain, unspecified: Secondary | ICD-10-CM

## 2019-06-11 DIAGNOSIS — R0602 Shortness of breath: Secondary | ICD-10-CM | POA: Diagnosis not present

## 2019-06-11 DIAGNOSIS — Z79899 Other long term (current) drug therapy: Secondary | ICD-10-CM | POA: Insufficient documentation

## 2019-06-11 DIAGNOSIS — Z952 Presence of prosthetic heart valve: Secondary | ICD-10-CM | POA: Insufficient documentation

## 2019-06-11 DIAGNOSIS — Z1159 Encounter for screening for other viral diseases: Secondary | ICD-10-CM | POA: Diagnosis not present

## 2019-06-11 DIAGNOSIS — M549 Dorsalgia, unspecified: Secondary | ICD-10-CM | POA: Diagnosis present

## 2019-06-11 DIAGNOSIS — R0902 Hypoxemia: Secondary | ICD-10-CM | POA: Diagnosis not present

## 2019-06-11 DIAGNOSIS — J90 Pleural effusion, not elsewhere classified: Secondary | ICD-10-CM | POA: Diagnosis not present

## 2019-06-11 DIAGNOSIS — Z20828 Contact with and (suspected) exposure to other viral communicable diseases: Secondary | ICD-10-CM | POA: Diagnosis not present

## 2019-06-11 DIAGNOSIS — J9811 Atelectasis: Secondary | ICD-10-CM | POA: Diagnosis not present

## 2019-06-11 DIAGNOSIS — I213 ST elevation (STEMI) myocardial infarction of unspecified site: Secondary | ICD-10-CM | POA: Diagnosis not present

## 2019-06-11 DIAGNOSIS — Z9889 Other specified postprocedural states: Secondary | ICD-10-CM

## 2019-06-11 DIAGNOSIS — E785 Hyperlipidemia, unspecified: Secondary | ICD-10-CM | POA: Insufficient documentation

## 2019-06-11 LAB — TROPONIN I
Troponin I: 0.09 ng/mL (ref <0.03)
Troponin I: 0.08 ng/mL (ref <0.03)

## 2019-06-11 LAB — CBG MONITORING, ED: Glucose-Capillary: 114 mg/dL — ABNORMAL HIGH (ref 70–99)

## 2019-06-11 LAB — BASIC METABOLIC PANEL
Anion gap: 12 (ref 5–15)
BUN: 16 mg/dL (ref 6–20)
CO2: 19 mmol/L — ABNORMAL LOW (ref 22–32)
Calcium: 8.7 mg/dL — ABNORMAL LOW (ref 8.9–10.3)
Chloride: 106 mmol/L (ref 98–111)
Creatinine, Ser: 1.06 mg/dL (ref 0.61–1.24)
GFR calc Af Amer: >60 mL/min (ref >60)
GFR calc non Af Amer: >60 mL/min (ref >60)
Glucose, Bld: 132 mg/dL — ABNORMAL HIGH (ref 70–99)
Potassium: 4.8 mmol/L (ref 3.5–5.1)
Sodium: 137 mmol/L (ref 135–145)

## 2019-06-11 LAB — CBC
HCT: 29.7 % — ABNORMAL LOW (ref 39.0–52.0)
Hemoglobin: 9.6 g/dL — ABNORMAL LOW (ref 13.0–17.0)
MCH: 29.4 pg (ref 26.0–34.0)
MCHC: 32.3 g/dL (ref 30.0–36.0)
MCV: 90.8 fL (ref 80.0–100.0)
Platelets: 878 10*3/uL — ABNORMAL HIGH (ref 150–400)
RBC: 3.27 MIL/uL — ABNORMAL LOW (ref 4.22–5.81)
RDW: 13.9 % (ref 11.5–15.5)
WBC: 18.6 10*3/uL — ABNORMAL HIGH (ref 4.0–10.5)
nRBC: 0 % (ref 0.0–0.2)

## 2019-06-11 LAB — PROTIME-INR
INR: 1.6 — ABNORMAL HIGH (ref 0.8–1.2)
Prothrombin Time: 18.5 seconds — ABNORMAL HIGH (ref 11.4–15.2)

## 2019-06-11 LAB — ECHOCARDIOGRAM COMPLETE

## 2019-06-11 LAB — SARS CORONAVIRUS 2: SARS Coronavirus 2: NOT DETECTED

## 2019-06-11 MED ORDER — IOHEXOL 350 MG/ML SOLN
100.0000 mL | Freq: Once | INTRAVENOUS | Status: AC | PRN
  Administered 2019-06-11: 100 mL via INTRAVENOUS

## 2019-06-11 MED ORDER — FENTANYL CITRATE (PF) 100 MCG/2ML IJ SOLN
25.0000 ug | Freq: Once | INTRAMUSCULAR | Status: AC
  Administered 2019-06-11: 25 ug via INTRAVENOUS

## 2019-06-11 MED ORDER — MORPHINE SULFATE (PF) 2 MG/ML IV SOLN: 2.0000 mg | INTRAVENOUS | Status: DC | PRN

## 2019-06-11 MED ORDER — MOMETASONE FURO-FORMOTEROL FUM 100-5 MCG/ACT IN AERO
2.0000 | INHALATION_SPRAY | Freq: Two times a day (BID) | RESPIRATORY_TRACT | Status: DC
  Filled 2019-06-11: qty 8.8

## 2019-06-11 MED ORDER — FENTANYL CITRATE (PF) 100 MCG/2ML IJ SOLN
INTRAMUSCULAR | Status: AC
  Administered 2019-06-11: 25 ug
  Filled 2019-06-11: qty 2

## 2019-06-11 MED ORDER — ACETAMINOPHEN 325 MG PO TABS: 650.0000 mg | ORAL_TABLET | Freq: Four times a day (QID) | ORAL | Status: DC | PRN

## 2019-06-11 MED ORDER — OXYCODONE HCL 5 MG PO TABS: 10.0000 mg | ORAL_TABLET | ORAL | Status: DC | PRN

## 2019-06-11 MED ORDER — ACETAMINOPHEN 650 MG RE SUPP: 650.0000 mg | Freq: Four times a day (QID) | RECTAL | Status: DC | PRN

## 2019-06-11 MED ORDER — ROSUVASTATIN CALCIUM 5 MG PO TABS: 5.0000 mg | ORAL_TABLET | Freq: Every day | ORAL | Status: DC

## 2019-06-11 MED ORDER — ASPIRIN 81 MG PO CHEW: 324.0000 mg | CHEWABLE_TABLET | Freq: Once | ORAL | Status: DC

## 2019-06-11 MED ORDER — WARFARIN SODIUM 5 MG PO TABS: 5.0000 mg | ORAL_TABLET | Freq: Every day | ORAL | Status: DC

## 2019-06-11 MED ORDER — FLUTICASONE PROPIONATE 50 MCG/ACT NA SUSP
1.0000 | Freq: Every day | NASAL | Status: DC
  Administered 2019-06-11 – 2019-06-12 (×2): 1 via NASAL
  Filled 2019-06-11: qty 16

## 2019-06-11 MED ORDER — ENOXAPARIN SODIUM 40 MG/0.4ML ~~LOC~~ SOLN
40.0000 mg | SUBCUTANEOUS | Status: DC
  Administered 2019-06-12: 16:00:00 40 mg via SUBCUTANEOUS
  Filled 2019-06-11: qty 0.4

## 2019-06-11 MED ORDER — ASPIRIN EC 81 MG PO TBEC
81.0000 mg | DELAYED_RELEASE_TABLET | Freq: Every day | ORAL | Status: DC
  Administered 2019-06-12: 81 mg via ORAL
  Filled 2019-06-11: qty 1

## 2019-06-11 MED ORDER — CYCLOBENZAPRINE HCL 5 MG PO TABS
7.5000 mg | ORAL_TABLET | Freq: Three times a day (TID) | ORAL | Status: DC | PRN
  Filled 2019-06-11: qty 1.5

## 2019-06-11 MED ORDER — TRAZODONE HCL 50 MG PO TABS: 50.0000 mg | ORAL_TABLET | Freq: Every evening | ORAL | Status: DC | PRN

## 2019-06-11 MED ORDER — OXYCODONE HCL 5 MG PO TABS
5.0000 mg | ORAL_TABLET | ORAL | Status: DC | PRN
  Administered 2019-06-12: 5 mg via ORAL
  Filled 2019-06-11: qty 1

## 2019-06-11 MED ORDER — WARFARIN SODIUM 7.5 MG PO TABS
7.5000 mg | ORAL_TABLET | Freq: Every day | ORAL | Status: DC
  Administered 2019-06-12: 09:00:00 7.5 mg via ORAL
  Filled 2019-06-11: qty 1

## 2019-06-11 MED ORDER — KETOROLAC TROMETHAMINE 30 MG/ML IJ SOLN
30.0000 mg | Freq: Four times a day (QID) | INTRAMUSCULAR | Status: AC
  Administered 2019-06-11 – 2019-06-12 (×4): 30 mg via INTRAVENOUS
  Filled 2019-06-11 (×4): qty 1

## 2019-06-11 MED ORDER — AMLODIPINE BESYLATE 5 MG PO TABS
5.0000 mg | ORAL_TABLET | Freq: Every day | ORAL | Status: DC
  Administered 2019-06-12: 5 mg via ORAL
  Filled 2019-06-11: qty 1

## 2019-06-11 MED ORDER — POLYETHYLENE GLYCOL 3350 17 G PO PACK: 17.0000 g | PACK | Freq: Every day | ORAL | Status: DC | PRN

## 2019-06-11 MED ORDER — DOCUSATE SODIUM 100 MG PO CAPS
100.0000 mg | ORAL_CAPSULE | Freq: Two times a day (BID) | ORAL | Status: DC
  Filled 2019-06-11: qty 1

## 2019-06-11 MED ORDER — METOPROLOL TARTRATE 12.5 MG HALF TABLET
12.5000 mg | ORAL_TABLET | Freq: Two times a day (BID) | ORAL | Status: DC
  Administered 2019-06-11 – 2019-06-12 (×2): 12.5 mg via ORAL
  Filled 2019-06-11 (×2): qty 1

## 2019-06-11 MED ORDER — KETOROLAC TROMETHAMINE 30 MG/ML IJ SOLN: 30.0000 mg | Freq: Four times a day (QID) | INTRAMUSCULAR | Status: DC | PRN

## 2019-06-11 MED ORDER — WARFARIN - PHYSICIAN DOSING INPATIENT: Freq: Every day | Status: DC

## 2019-06-11 NOTE — ED Notes (Signed)
This Rn acting as Art therapist and asked pt if he would like for me to reach out to any family. Pt requested that his wife, Jenny Reichmann [phone] be updated. I was able to speak with Jenny Reichmann and update on care thus far. Will reach out again once we have more information.

## 2019-06-11 NOTE — ED Notes (Signed)
Verbal order for 500CC of ns due to drop in bp

## 2019-06-11 NOTE — ED Provider Notes (Signed)
[facility] Provider Note   CSN: [id number] Arrival date & time: 06/11/19  1156     History   Chief Complaint Chief Complaint  Patient presents with  . Code STEMI    HPI Calvin [name] is a 53 y.o. male.     HPI Patient presents with concern of chest pain. Pain began suddenly about 2 hours prior to ED arrival, described as tight, sore, severe across the superior chest. There is some associated lightheadedness, nausea, no vomiting. Patient has multiple medical issues including CAD, with 2 prior stents, and had cardiac surgery, mitral valve repair 2 weeks ago. He has been taking his medication as directed, including Coumadin since the surgery. He notes that he was feeling well afterwards, had no similar chest pain until this morning.  EMS reports the patient was mildly hypotensive, diaphoretic on arrival, received nitroglycerin, aspirin, with slight drop in his blood pressure.  She designated as a code STEMI prior to arrival  Past Medical History:  Diagnosis Date  . ACS (acute coronary syndrome) with NSTEMI 03/25/2014  . Acute sinus infection 03/27/2014  . Asthma   . Asthmatic bronchitis   . CAD (coronary artery disease), native coronary artery, DES placed to 1st diag. 03/26/14; residual disease to LAD with FFR to 0.62 and RCA of 50-60%- plan for PCI to LAD 03/27/2014  . Coronary artery disease   . Dyslipidemia, goal LDL below 70 03/27/2014  . GI bleed 03/27/2014  . Hyperlipemia   . Hypertension    Norvasc  . MVP (mitral valve prolapse)    Dr Lady GaryFath  . Non Q wave myocardial infarction (HCC) 03/23/2014  . Psoriasis   . Rhinitis   . S/P minimally invasive mitral valve repair 05/31/2019   Complex valvuloplasty including quadrangular resection of posterior leaflet, sliding leaflet annuloplasty, autologous pericardial patch augmentation of P1 segment of posterior leaflet and 34 mm Sorin Memo 4D ring annuloplasty via right mini thoracotomy approach     Patient Active Problem List   Diagnosis Date Noted  . Acute venous embolism and thrombosis of deep vessels of proximal lower extremity (HCC) 06/07/2019  . Atrial fibrillation (HCC) 06/07/2019  . Long term (current) use of anticoagulants 06/07/2019  . Primary hypercoagulable state (HCC) 06/07/2019  . S/P minimally invasive mitral valve repair 05/31/2019  . Severe mitral regurgitation 05/16/2019  . Dyspnea on exertion 03/14/2019  . Syncope 04/22/2016  . Kidney disease 04/22/2016  . Faintness   . CAD in native artery 07/01/2015  . Other and unspecified angina pectoris 04/16/2014  . Mitral valve prolapse 04/09/2014  . H/O psoriasis 04/09/2014  . GI bleed 03/27/2014  . Dyslipidemia, goal LDL below 70 03/27/2014  . Acute sinus infection 03/27/2014  . Common variable immunodeficiency (HCC) 10/21/2013  . Nasal polyp 03/26/2012  . 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 Chronic maxillary sinusitis 12/19/2011    Past Surgical History:  Procedure Laterality Date  . CORONARY ANGIOPLASTY WITH STENT PLACEMENT  03/25/2014; 04/16/2014   "1 + 1"  . FLEXIBLE SIGMOIDOSCOPY Left 03/28/2014   Procedure: FLEXIBLE SIGMOIDOSCOPY;  Surgeon: Theda BelfastPatrick D Hung, MD;  [facility];  Service: Endoscopy;  Laterality: Left;  . LEFT HEART CATHETERIZATION WITH CORONARY ANGIOGRAM N/A 03/26/2014   Procedure: LEFT HEART CATHETERIZATION WITH CORONARY ANGIOGRAM;  Surgeon: Lesleigh NoeHenry W Smith III, MD;  [facility];  Service: Cardiovascular;  Laterality: N/A;  . MITRAL VALVE REPAIR Right 05/31/2019   Procedure: MINIMALLY INVASIVE MITRAL VALVE REPAIR (MVR) Using Memo 4D Ring Size 34MM;  Surgeon: Rexene Alberts, MD;  Location: [place];  Service: Open Heart Surgery;  Laterality: Right;  . nasal polyps    . PERCUTANEOUS CORONARY STENT INTERVENTION (PCI-S) N/A 04/16/2014   Procedure: PERCUTANEOUS CORONARY STENT INTERVENTION (PCI-S);  Surgeon: Sinclair Grooms, MD;  Location: [facility] CATH LAB;  Service: Cardiovascular;   Laterality: N/A;  . RIGHT/LEFT HEART CATH AND CORONARY ANGIOGRAPHY N/A 03/14/2019   Procedure: RIGHT/LEFT HEART CATH AND CORONARY ANGIOGRAPHY;  Surgeon: Belva Crome, MD;  Location: [place] CV LAB;  Service: Cardiovascular;  Laterality: N/A;  . TEE WITHOUT CARDIOVERSION N/A 03/05/2019   Procedure: TRANSESOPHAGEAL ECHOCARDIOGRAM (TEE);  Surgeon: Skeet Latch, MD;  Location: [place];  Service: Cardiovascular;  Laterality: N/A;  . TEE WITHOUT CARDIOVERSION N/A 05/31/2019   Procedure: TRANSESOPHAGEAL ECHOCARDIOGRAM (TEE);  Surgeon: Rexene Alberts, MD;  Location: [place];  Service: Open Heart Surgery;  Laterality: N/A;  . WISDOM TOOTH EXTRACTION          Home Medications    Prior to Admission medications   Medication Sig Start Date End Date Taking? Authorizing Provider  acetaminophen (TYLENOL) 325 MG tablet Take 2 tablets (650 mg total) by mouth every 4 (four) hours as needed. For pain. 06/05/19 06/04/20  Antony Odea, PA-C  albuterol (PROAIR HFA) 108 (90 BASE) MCG/ACT inhaler Inhale 2 puffs into the lungs every 6 (six) hours as needed for wheezing or shortness of breath. 02/21/12   Baird Lyons D, MD  amLODipine (NORVASC) 5 MG tablet Take 1 tablet (5 mg total) by mouth daily. Please call and schedule an appointment for further refills 1st attempt 12/25/18   Belva Crome, MD  aspirin EC 81 MG tablet Take 81 mg by mouth daily.    [provider]  cetirizine (ZYRTEC) 10 MG tablet Take 10 mg by mouth daily.    [provider]  chlorhexidine (PERIDEX) 0.12 % solution Rinse with 15 mls twice daily for 30 seconds. Use after breakfast and at bedtime. Spit out excess. Do not swallow. Patient taking differently: Use as directed 15 mLs in the mouth or throat 2 (two) times daily. Rinse with 15 mls twice daily for 30 seconds. Use after breakfast and at bedtime. Spit out excess. Do not swallow. 05/16/19 11/15/19  Lenn Cal, DDS  fenofibrate 160 MG tablet Take 160 mg  by mouth daily.  12/09/11   [provider]  fluticasone (CUTIVATE) 0.05 % cream Apply 1 application topically every evening.     [provider]  fluticasone (FLONASE) 50 MCG/ACT nasal spray Place 1 spray into both nostrils every evening.  04/28/16   [provider]  metoprolol tartrate (LOPRESSOR) 25 MG tablet Take 0.5 tablets (12.5 mg total) by mouth 2 (two) times daily. 06/05/19   Antony Odea, PA-C  mometasone-formoterol (DULERA) 100-5 MCG/ACT AERO Inhale 1 puff into the lungs 2 (two) times daily. 05/26/16   [provider]  Multiple Vitamin (MULTIVITAMIN WITH MINERALS) TABS tablet Take 1 tablet by mouth daily.    [provider]  rosuvastatin (CRESTOR) 5 MG tablet Take 1 tablet (5 mg total) by mouth daily. 02/13/19   Belva Crome, MD  warfarin (COUMADIN) 5 MG tablet Take 1 tablet (5 mg total) by mouth daily. or as directed by the Coumadin Clinic. 06/05/19 06/04/20  Antony Odea, PA-C    Family History Family History  Problem Relation Age of Onset  . Heart disease Mother        deceased age 60  .  Emphysema Maternal Grandmother        non smoker  . Asthma Maternal Grandmother   . Hypertension Maternal Grandmother   . Prostate cancer Paternal Grandfather   . Heart disease Maternal Uncle        valve replaced; deceased age 54  . Stroke Maternal Uncle   . Heart attack Neg Hx     Social History Social History   Tobacco Use  . Smoking status: Never Smoker  . Smokeless tobacco: Never Used  Substance Use Topics  . Alcohol use: No  . Drug use: No     Allergies   Brilinta [ticagrelor] and Daliresp [roflumilast]   Review of Systems Review of Systems  Constitutional:       Per HPI, otherwise negative  HENT:       Per HPI, otherwise negative  Respiratory:       Per HPI, otherwise negative  Cardiovascular:       Per HPI, otherwise negative  Gastrointestinal: Negative for vomiting.  Endocrine:       Negative aside from  HPI  Genitourinary:       Neg aside from HPI   Musculoskeletal:       Per HPI, otherwise negative  Skin: Positive for wound.  Neurological: Negative for syncope.  Hematological: Bruises/bleeds easily.     Physical Exam Updated Vital Signs BP (!) 86/63   Pulse 97   Temp 98 F (36.7 C) (Temporal)   Resp (!) 23   SpO2 98%   Physical Exam Vitals signs and nursing note reviewed.  Constitutional:      Appearance: He is well-developed. He is ill-appearing and diaphoretic.  HENT:     Head: Normocephalic and atraumatic.  Eyes:     Conjunctiva/sclera: Conjunctivae normal.  Cardiovascular:     Rate and Rhythm: Regular rhythm. Tachycardia present.  Pulmonary:     Effort: Pulmonary effort is normal. No respiratory distress.     Breath sounds: No stridor.  Abdominal:     General: There is no distension.  Skin:    General: Skin is warm.     Comments: Ecchymosis, surgical tissue adhesive in place in the right upper chest wall, no crepitus, no tenderness palpation  Neurological:     Mental Status: He is alert and oriented to person, place, and time.      ED Treatments / Results  Labs (all labs ordered are listed, but only abnormal results are displayed) Labs Reviewed  BASIC METABOLIC PANEL - Abnormal; Notable for the following components:      Result Value   CO2 19 (*)    Glucose, Bld 132 (*)    Calcium 8.7 (*)    All other components within normal limits  TROPONIN I - Abnormal; Notable for the following components:   Troponin I 0.09 (*)    All other components within normal limits  CBC - Abnormal; Notable for the following components:   WBC 18.6 (*)    RBC 3.27 (*)    Hemoglobin 9.6 (*)    HCT 29.7 (*)    Platelets 878 (*)    All other components within normal limits  PROTIME-INR - Abnormal; Notable for the following components:   Prothrombin Time 18.5 (*)    INR 1.6 (*)    All other components within normal limits  CBG MONITORING, ED - Abnormal; Notable for the  following components:   Glucose-Capillary 114 (*)    All other components within normal limits  CULTURE, BLOOD (ROUTINE X 2)  CULTURE, BLOOD (ROUTINE X 2)  SARS CORONAVIRUS 2  TROPONIN I    EKG EKG Interpretation  Date/Time:  Monday June 11 2019 11:58:07 EDT Ventricular Rate:  100 PR Interval:    QRS Duration: 84 QT Interval:  357 QTC Calculation: 461 R Axis:   37 Text Interpretation:  Sinus tachycardia Prolonged PR interval ST-t wave abnormality Abnormal ECG Confirmed by Gerhard MunchLockwood, Amron Guerrette [id number]) on 06/11/2019 12:15:22 PM   Radiology Dg Chest Portable 1 View  Result Date: 06/11/2019 CLINICAL DATA:  Central chest pain, radiates to the back EXAM: PORTABLE CHEST 1 VIEW COMPARISON:  06/05/2019 FINDINGS: Small right pleural effusion. Right basilar airspace disease likely reflecting atelectasis. No left pleural effusion. No pneumothorax. Stable cardiomediastinal silhouette. No aggressive osseous lesion. IMPRESSION: 1. Small right pleural effusion with right basilar atelectasis. Electronically Signed   By: Elige KoHetal  Patel   On: 06/11/2019 12:49    Procedures Procedures (including critical care time)  Medications Ordered in ED Medications  aspirin chewable tablet 324 mg (324 mg Oral Not Given 06/11/19 1219)  fentaNYL (SUBLIMAZE) 100 MCG/2ML injection (25 mcg  Given 06/11/19 1209)     Initial Impression / Assessment and Plan / ED Course  I have reviewed the triage vital signs and the nursing notes.  Pertinent labs & imaging results that were available during my care of the patient were reviewed by me and considered in my medical decision making (see chart for details).    Date: Initial x-ray concerning for right-sided pleural effusion.   Date: I discussed the patient's case with our cardiothoracic physician assistant colleague.  Update: Labs notable for slight elevation in troponin, though this is positive and the patient does have chest pain, his EKG is largely unchanged, code STEMI  was canceled by cardiology and the patient is taking his Coumadin.   2:40 PM I discussed the patient's case with our cardiothoracic surgeon colleague, Dr. Cornelius Moraswen, who was the patient's surgeon for mitral valve repair We reviewed the images from the CT angiography to exclude dissection, which are reassuring.  This patient presents 2 weeks after mitral valve repair, now with chest pain. Given the sudden onset nature, ACS is a consideration, as is dissection. Initial labs notable for elevated troponin, though decreased from recent hospitalization, subtherapeutic INR. Findings also notable for right-sided pleural effusion, CT scan done with no demonstration of dissection. Patient required admission for further monitoring, management of his chest pain and pleural effusion.  Final Clinical Impressions(s) / ED Diagnoses   Final diagnoses:  Atypical chest pain  Pleural effusion, right     Gerhard MunchLockwood, Minerva Bluett, MD 06/11/19 1531

## 2019-06-11 NOTE — ED Notes (Signed)
PA with cardiology at bedside. 

## 2019-06-11 NOTE — H&P (Addendum)
[address]            [place], [id number]          [phone]     Calvin [name] is an 53 y.o. male. 09/08/1966 [id number]   Chief Complaint: Back and chest pain.  History of Presenting Illness: [name] is a pleasant 53 year old male well-known to our service who has a history of coronary artery disease status post PTCI to the LAD and diagonal the past, severe mitral valve regurgitation due to prolapse status post minimally invasive mitral valve repair by Dr. Constance Goltzlen 05/31/2019.  He had a full postoperative course.  He was discharged in satisfactory condition on 06/05/2019.  He had continued to make aggressive without any concerns until this morning when he awoke with pain in chest and his back between the shoulder blades.  He did not recall any injury or activity that would have provoked this level of pain.  Denied any nausea or shortness of breath but did have some diaphoresis.  He summoned EMS and was taken [facility].  Initial work-up included an EKG that showed normal sinus rhythm with first-degree AV block.  Chest x-ray a small to moderate right pleural effusion with associated atelectasis and was otherwise unremarkable.  White blood count was 18,800.  Hematocrit was near 30%.  BMP was unremarkable.  His pain persisted after arrival to the ED.  IV narcotic analgesic the ER physician with some improvement.  Further work-up followed included a CT angiogram of the chest that showed no evidence of aortic dissection or PE.  An echocardiogram was obtained with results pending at the time of this dictation.  Patient has been seen and examined by Dr. Cornelius Moraswen.  He feels the patient's symptoms most likely represent musculoskeletal pain but recommends admission for close observation.  This has been discussed with [name] and his wife.   Past Medical History: Past Medical History:  Diagnosis Date  . ACS (acute coronary syndrome)  with NSTEMI 03/25/2014  . Acute sinus infection 03/27/2014  . Asthma   . Asthmatic bronchitis   . CAD (coronary artery disease), native coronary artery, DES placed to 1st diag. 03/26/14; residual disease to LAD with FFR to 0.62 and RCA of 50-60%- plan for PCI to LAD 03/27/2014  . Coronary artery disease   . Dyslipidemia, goal LDL below 70 03/27/2014  . GI bleed 03/27/2014  . Hyperlipemia   . Hypertension    Norvasc  . MVP (mitral valve prolapse)    Dr Lady GaryFath  . Non Q wave myocardial infarction (HCC) 03/23/2014  . Psoriasis   . Rhinitis   . S/P minimally invasive mitral valve repair 05/31/2019   Complex valvuloplasty including quadrangular resection of posterior leaflet, sliding leaflet annuloplasty, autologous pericardial patch augmentation of P1 segment of posterior leaflet and 34 mm Sorin Memo 4D ring annuloplasty via right mini thoracotomy approach    Past Surgical History: Past Surgical History:  Procedure Laterality Date  . CORONARY ANGIOPLASTY WITH STENT PLACEMENT  03/25/2014; 04/16/2014   "1 + 1"  . FLEXIBLE SIGMOIDOSCOPY Left 03/28/2014   Procedure: FLEXIBLE SIGMOIDOSCOPY;  Surgeon: Theda BelfastPatrick D Hung, MD;  [facility];  Service: Endoscopy;  Laterality: Left;  . LEFT HEART CATHETERIZATION WITH CORONARY ANGIOGRAM N/A 03/26/2014   Procedure: LEFT HEART CATHETERIZATION WITH CORONARY ANGIOGRAM;  Surgeon: Lesleigh NoeHenry W Smith III, MD;  Location:  [facility] CATH LAB;  Service: Cardiovascular;  Laterality: N/A;  . MITRAL VALVE REPAIR Right 05/31/2019   Procedure: MINIMALLY INVASIVE MITRAL VALVE REPAIR (MVR) Using Memo 4D Ring Size ;  Surgeon: Purcell Nails, MD;  Location: [facility] OR;  Service: Open Heart Surgery;  Laterality: Right;  . nasal polyps    . PERCUTANEOUS CORONARY STENT INTERVENTION (PCI-S) N/A 04/16/2014   Procedure: PERCUTANEOUS CORONARY STENT INTERVENTION (PCI-S);  Surgeon: [name], MD;  Location: [facility] CATH LAB;  Service: Cardiovascular;  Laterality: N/A;  . RIGHT/LEFT HEART CATH AND  CORONARY ANGIOGRAPHY N/A 03/14/2019   Procedure: RIGHT/LEFT HEART CATH AND CORONARY ANGIOGRAPHY;  Surgeon: [name], MD;  Location: [facility] INVASIVE CV LAB;  Service: Cardiovascular;  Laterality: N/A;  . TEE WITHOUT CARDIOVERSION N/A 03/05/2019   Procedure: TRANSESOPHAGEAL ECHOCARDIOGRAM (TEE);  Surgeon: [name], MD;  Location: [facility] ENDOSCOPY;  Service: Cardiovascular;  Laterality: N/A;  . TEE WITHOUT CARDIOVERSION N/A 05/31/2019   Procedure: TRANSESOPHAGEAL ECHOCARDIOGRAM (TEE);  Surgeon: Purcell Nails, MD;  Location: [facility] OR;  Service: Open Heart Surgery;  Laterality: N/A;  . WISDOM TOOTH EXTRACTION      Family History: Family History  Problem Relation Age of Onset  . Heart disease Mother        deceased age 78  . Emphysema Maternal Grandmother        non smoker  . Asthma Maternal Grandmother   . Hypertension Maternal Grandmother   . Prostate cancer Paternal Grandfather   . Heart disease Maternal Uncle        valve replaced; deceased age 30  . Stroke Maternal Uncle   . Heart attack Neg Hx      Social History:   reports that he has never smoked. He has never used smokeless tobacco. He reports that he does not drink alcohol or use drugs.  Allergies:  Allergies  Allergen Reactions  . Brilinta [Ticagrelor] Other (See Comments)    GI bleed  . Daliresp [Roflumilast] Other (See Comments)    GI UPSET    (Not in a hospital admission)    Review of Systems:  Cardiac Review of Systems: Y or N  Chest Pain [ y ] Resting SOB [ n ] Exertional SOB [ n ] Orthopnea   Pedal Edema y[ ]  Palpitations [n ] Syncope  Presyncope [ n ]  General Review of Systems: [Y] = yes =no  Constitional: recent weight change ; anorexia ; fatigue ; nausea ; night sweats ; fever ; or chills ;  Dental: poor dentition[ ] ; Last Dentist visit 6 months  Eye : blurred vision ; diplopia ; vision changes ; Amaurosis fugax[ ] ;  Resp: cough ; wheezing[ ] ; hemoptysis[ ] ;  shortness of breath[ ] ; paroxysmal nocturnal dyspnea[ ] ; dyspnea on exertion[ ] ; or orthopnea[ ] ;  GI: gallstones[ ] , vomiting[ ] ; dysphagia[ ] ; melena[ ] ; hematochezia ; heartburn[ ] ; Hx of Colonoscopy[ ] ;  GU: kidney stones ; hematuria[ ] ; dysuria ; nocturia[ ] ; history of obstruction ;  Skin: rash, swelling[ ] ;, hair loss[ ] ; peripheral edema[ ] ; or itching[ ] ;  Musculosketetal: myalgias[ ] ; joint swelling[ ] ; joint erythema[ ] ;  joint pain[ ] ; back pain[y ];  Heme/Lymph: bruising[ ] ; bleeding[ ] ; anemia[ ] ;  Neuro: TIA[ ] ; headaches[ ] ; stroke[ ] ; vertigo[ ] ; seizures[ ] ; paresthesias[ ] ; difficulty walking[ ] ;  Psych:depression[ ] ; anxiety[ ] ;  Endocrine: diabetes[ ] ; thyroid dysfunction[ ] ;  Immunizations:  Flu ; Pneumococcal[ ] ;  Other:  BP 130/81   Pulse (!) 102   Temp 98 F (36.7 C) (Temporal)   Resp 18   SpO2 99%   Physical Exam: General appearance: alert, cooperative and moderate distress Neurologic: intact Heart: Regular rate and rhythm.  There is a soft systolic murmur.  Monitor shows normal sinus rhythm with first-degree AV block Lungs: Breath sounds are clear but diminished on the right. Abdomen: soft, non-tender; bowel sounds normal; no masses,  no organomegaly Extremities: Extremities are all well perfused with palpable pulses.  There is trace bilateral. Wound: The right chest incision as expected postsurgical bruising.  There is minimal tenderness.  The skin edges are well approximated and healing with no sign of complication.  There is no erythema or drainage.   Diagnostic Studies and Laboratory Results: Results for orders placed or performed during the hospital encounter of 06/11/19 (from the past 48 hour(s))  Basic metabolic panel     Status: Abnormal   Collection Time: 06/11/19 12:01 PM  Result Value Ref Range   Sodium 137 135 - 145 mmol/L   Potassium 4.8 3.5 - 5.1 mmol/L   Chloride 106 98 - 111 mmol/L   CO2 19 (L) 22 - 32 mmol/L   Glucose,  Bld 132 (H) 70 - 99 mg/dL   BUN 16 6 - 20 mg/dL   Creatinine, Ser 9.60 0.61 - 1.24 mg/dL   Calcium 8.7 (L) 8.9 - 10.3 mg/dL   GFR calc non Af Amer >60 >60 mL/min   GFR calc Af Amer >60 >60 mL/min   Anion gap 12 5 - 15    Comment: Performed at [facility], [address] [place], [place] [address]  Troponin I - Now Then Q3H     Status: Abnormal   Collection Time: 06/11/19 12:01 PM  Result Value Ref Range   Troponin I 0.09 (HH) <0.03 ng/mL    Comment: CRITICAL RESULT CALLED TO, READ BACK BY AND VERIFIED WITH: GROSE,C RN @ 1351 06/11/19 LEONARD,A Performed at [facility], [address] [place], [place] [address]   CBC     Status: Abnormal   Collection Time: 06/11/19 12:01 PM  Result Value Ref Range   WBC 18.6 (H) 4.0 - 10.5 K/uL   RBC 3.27 (L) 4.22 - 5.81 MIL/uL   Hemoglobin 9.6 (L) 13.0 - 17.0 g/dL   HCT 47.8 (L) 29.5 - 62.1 %   MCV 90.8 80.0 - 100.0 fL   MCH 29.4 26.0 - 34.0 pg   MCHC 32.3 30.0 - 36.0 g/dL   RDW 30.8 65.7 - 84.6 %   Platelets 878 (H) 150 - 400 K/uL   nRBC 0.0 0.0 - 0.2 %    Comment: Performed at [facility], [address], [place], [place] [address]  Protime-INR     Status: Abnormal   Collection Time: 06/11/19 12:01 PM  Result Value Ref Range   Prothrombin Time 18.5 (H) 11.4 - 15.2 seconds   INR 1.6 (H) 0.8 - 1.2    Comment: (NOTE) INR goal varies based on device and disease states. Performed at [facility], [address], [place], [place] [address]   SARS Coronavirus 2     Status: None   Collection Time: 06/11/19 12:09 PM  Result Value Ref Range   SARS Coronavirus 2 NOT DETECTED NOT DETECTED    Comment: (NOTE) SARS-CoV-2 target nucleic acids are NOT DETECTED. The SARS-CoV-2 RNA is generally detectable  in upper and lower respiratory specimens during the acute phase of infection.  Negative  results do not preclude SARS-CoV-2 infection, do not rule out co-infections with other pathogens, and should not be used as the sole  basis for treatment or other patient management decisions.  Negative results must be combined with clinical observations, patient history, and epidemiological information. The expected result is Not Detected. Fact Sheet for Patients: http://www.biofiredefense.com/wp-content/uploads/2020/03/BIOFIRE-COVID -19-patients.pdf Fact Sheet for Healthcare Providers: http://www.biofiredefense.com/wp-content/uploads/2020/03/BIOFIRE-COVID -19-hcp.pdf This test is not yet approved or cleared by the [place] and  has been authorized for detection and/or diagnosis of SARS-CoV-2 by FDA under an Emergency Use Authorization (EUA).  This EUA will remain in effec t (meaning this test can be used) for the duration of  the COVID-19 declaration under Section 564(b)(1) of the Act, 21 U.S.C. section 360bbb-3(b)(1), unless the authorization is terminated or revoked sooner. Performed at [facility], [place] [address] [place], [place] [address]   CBG monitoring, ED     Status: Abnormal   Collection Time: 06/11/19 12:11 PM  Result Value Ref Range   Glucose-Capillary 114 (H) 70 - 99 mg/dL   Dg Chest Portable 1 View  Result Date: 06/11/2019 CLINICAL DATA:  Central chest pain, radiates to the back EXAM: PORTABLE CHEST 1 VIEW COMPARISON:  06/05/2019 FINDINGS: Small right pleural effusion. Right basilar airspace disease likely reflecting atelectasis. No left pleural effusion. No pneumothorax. Stable cardiomediastinal silhouette. No aggressive osseous lesion. IMPRESSION: 1. Small right pleural effusion with right basilar atelectasis. Electronically Signed   By: Kathreen Devoid   On: 06/11/2019 12:49   Ct Angio Chest/abd/pel For Dissection W And/or W/wo  Result Date: 06/11/2019 CLINICAL DATA:  Severe midline chest pain status post mitral valve replacement on 05/31/2019. EXAM: CT ANGIOGRAPHY CHEST, ABDOMEN AND PELVIS TECHNIQUE: Multidetector CT imaging through the chest, abdomen and pelvis was performed using  the standard protocol during bolus administration of intravenous contrast. Multiplanar reconstructed images and MIPs were obtained and reviewed to evaluate the vascular anatomy. CONTRAST:  119mL OMNIPAQUE IOHEXOL 350 MG/ML SOLN COMPARISON:  CT dated 05/22/2019. FINDINGS: CTA CHEST FINDINGS Cardiovascular: The patient is status post mitral valve replacement. Coronary artery calcifications and stents are noted. There is no significant pericardial effusion. The heart size is not significantly enlarged. There are mild atherosclerotic changes of the thoracic aorta. Mediastinum/Nodes: No enlarged mediastinal, hilar, or axillary lymph nodes. There is a mildly enlarged right hilar lymph node which is likely reactive. Thyroid gland, trachea, and esophagus demonstrate no significant findings. Lungs/Pleura: There is a possible trace right-sided pneumothorax, however beam hardening artifact have a similar appearance from the first rib. No evidence of a left-sided pneumothorax. There is a moderate-sized right-sided pleural effusion. There is a trace left-sided pleural effusion. There is atelectasis at the left lung base. There is atelectasis involving much of the right lower lobe. There is a branching opacity in the left upper lobe, stable from prior CT. This is favored to represent inspissated mucous Musculoskeletal: There is gas within the right pectoralis muscle. There is some fat stranding and asymmetric enlargement of the right pectoralis muscle. There is a small 4 by 1.4 cm collection anterior to the right pectoralis muscle favored to represent a small postoperative seroma or hematoma. There are pockets of subcutaneous gas along the patient's right flank. Review of the MIP images confirms the above findings. CTA ABDOMEN AND PELVIS FINDINGS VASCULAR Aorta: Normal caliber aorta without aneurysm, dissection, vasculitis or significant stenosis. Celiac: There is severe stenosis at the  origin of the celiac axis with mild  poststenotic dilatation with the celiac trunk measuring approximately 1 cm in diameter. SMA: Patent without evidence of aneurysm, dissection, vasculitis or significant stenosis. Renals: Both renal arteries are patent without evidence of aneurysm, dissection, vasculitis, fibromuscular dysplasia or significant stenosis. There are 2 right renal arteries. IMA: Patent without evidence of aneurysm, dissection, vasculitis or significant stenosis. Inflow: Patent without evidence of aneurysm, dissection, vasculitis or significant stenosis. Veins: No obvious venous abnormality within the limitations of this arterial phase study. Review of the MIP images confirms the above findings. NON-VASCULAR Hepatobiliary: No focal liver abnormality is seen. No gallstones, gallbladder wall thickening, or biliary dilatation. Pancreas: Unremarkable. No pancreatic ductal dilatation or surrounding inflammatory changes. Spleen: Normal in size without focal abnormality. Adrenals/Urinary Tract: There is air within the urinary bladder. There is no hydronephrosis. No radiopaque obstructing kidney stones. The adrenal glands are unremarkable. Stomach/Bowel: Stomach is within normal limits. Appendix appears normal. No evidence of bowel wall thickening, distention, or inflammatory changes. Lymphatic: No significant vascular findings are present. No enlarged abdominal or pelvic lymph nodes. Reproductive: Uterus and bilateral adnexa are unremarkable. Other: There is some fat stranding in the right inguinal region which is likely related to recent intervention. Musculoskeletal: No acute or significant osseous findings. Review of the MIP images confirms the above findings. IMPRESSION: 1. No dissection.  No pulmonary embolus. 2. Possible trace right-sided pneumothorax. 3. Moderate right-sided pleural effusion. 4. Bibasilar atelectasis, right worse than left. 5. Subcutaneous gas along the right pectoralis muscle with associated adjacent fat stranding and  mild asymmetric enlargement of the right pectoralis major muscle, is likely all postsurgical and related to the patient's recent mitral valve replacement. 6. 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 7. There are few foci of gas within the urinary bladder. Correlation for recent instrumentation is recommended. Electronically Signed   By: Katherine Mantlehristopher  Green M.D.   On: 06/11/2019 14:45     Assessment/Plan  -53 year old male status post minimally invasive mitral valve repair on 05/31/2019 was discharged home on postop day 5 after an uncomplicated postoperative course.  He now presents with acute onset chest pain that radiates to his back.  His postoperative pain has been managed with Tylenol alone since his discharge so this level of discomfort represents a significant change.  Work-up thus far shows a small to moderate right pleural effusion that is not unexpected and a moderate leukocytosis.  A CTA of the chest has ruled out pulmonary embolus and aortic dissection.  An echocardiogram has been obtained with results pending.  Most likely explanation based on these findings it is musculoskeletal pain.  Will admit for observation and pain control.  We will continue his warfarin anticoagulation.  His INR today is 1.6.  Blood cultures have been obtained as well.  COVID screening was negative.  -Coronary artery disease, status post PTCI of the LAD and diagonal.  Preoperative left heart catheterization showed an 80% lesion diagonal proximal to the PTCI.  Continue his aspirin, statin, and beta-blocker.    Leary RocaMyron G. Roddenberry, PA-C 06/11/2019, 3:12 PM   I have personally seen and examined the patient, reviewed the patient's admission blood work, CT angiogram, and echocardiogram, and agree with the assessment and plan as outlined.  I suspect the patient's acute onset of pain across his back and radiating to the anterior chest is musculoskeletal in nature.  Pain is reproduced by deep breath  and coughing.  The patient does have a small to moderate size right  pleural effusion with significant right lower lobe atelectasis, although this is not unusual so soon after surgery and I am skeptical that this is the cause of the pain.   Purcell Nailslarence H Owen, MD 06/11/2019 3:55 PM

## 2019-06-11 NOTE — Progress Notes (Signed)
  Echocardiogram 2D Echocardiogram has been performed.  Technically difficult views due to pads on chest.  Haru Anspaugh L Androw 06/11/2019, 3:01 PM

## 2019-06-11 NOTE — Progress Notes (Signed)
TCTS BRIEF PROGRESS NOTE   Patient seen and examined.  Results of CT angiogram discussed.  I personally spoke with the patient's wife over the telephone.  Await results of echocardiogram.  I suspect the patient's pain is primarily musculoskeletal in origin.  We will plan to admit for at least overnight observation.  Rexene Alberts, MD 06/11/2019 3:06 PM

## 2019-06-11 NOTE — ED Notes (Signed)
ED Provider at bedside. 

## 2019-06-11 NOTE — ED Notes (Signed)
Pt being taken to CT scan

## 2019-06-11 NOTE — ED Triage Notes (Addendum)
Pt BIB [facility] d/t central CP radiates to back, ribs, and neck. Pt recently had a mitral valve replacement 11 days ago by Dr. Ricard Dillon... Dr. Tamala Julian Cards  2 nitro  324 ASA 20 L.hand  99/61 98 4L 100HR 140 CBG 97.70F Temp

## 2019-06-11 NOTE — ED Notes (Signed)
Per charge nurse- code stemi canceled by cath lab team

## 2019-06-11 NOTE — ED Notes (Signed)
Echo at bedside

## 2019-06-12 ENCOUNTER — Observation Stay (HOSPITAL_COMMUNITY): Payer: BC Managed Care – PPO

## 2019-06-12 ENCOUNTER — Other Ambulatory Visit: Payer: Self-pay

## 2019-06-12 ENCOUNTER — Encounter (HOSPITAL_COMMUNITY): Payer: Self-pay

## 2019-06-12 DIAGNOSIS — J9811 Atelectasis: Secondary | ICD-10-CM | POA: Diagnosis not present

## 2019-06-12 DIAGNOSIS — R899 Unspecified abnormal finding in specimens from other organs, systems and tissues: Secondary | ICD-10-CM | POA: Diagnosis not present

## 2019-06-12 DIAGNOSIS — J90 Pleural effusion, not elsewhere classified: Secondary | ICD-10-CM | POA: Diagnosis not present

## 2019-06-12 HISTORY — PX: IR THORACENTESIS ASP PLEURAL SPACE W/IMG GUIDE: IMG5380

## 2019-06-12 LAB — CBC
HCT: 26.5 % — ABNORMAL LOW (ref 39.0–52.0)
Hemoglobin: 8.6 g/dL — ABNORMAL LOW (ref 13.0–17.0)
MCH: 29.2 pg (ref 26.0–34.0)
MCHC: 32.5 g/dL (ref 30.0–36.0)
MCV: 89.8 fL (ref 80.0–100.0)
Platelets: 778 10*3/uL — ABNORMAL HIGH (ref 150–400)
RBC: 2.95 MIL/uL — ABNORMAL LOW (ref 4.22–5.81)
RDW: 13.7 % (ref 11.5–15.5)
WBC: 16.3 10*3/uL — ABNORMAL HIGH (ref 4.0–10.5)
nRBC: 0 % (ref 0.0–0.2)

## 2019-06-12 LAB — GRAM STAIN

## 2019-06-12 LAB — PROTIME-INR
INR: 1.6 — ABNORMAL HIGH (ref 0.8–1.2)
Prothrombin Time: 18.6 seconds — ABNORMAL HIGH (ref 11.4–15.2)

## 2019-06-12 LAB — BODY FLUID CELL COUNT WITH DIFFERENTIAL
Eos, Fluid: 5 %
Lymphs, Fluid: 6 %
Monocyte-Macrophage-Serous Fluid: 11 % — ABNORMAL LOW (ref 50–90)
Neutrophil Count, Fluid: 78 % — ABNORMAL HIGH (ref 0–25)
Total Nucleated Cell Count, Fluid: 8950 cu mm — ABNORMAL HIGH (ref 0–1000)

## 2019-06-12 MED ORDER — LIDOCAINE HCL 1 % IJ SOLN
INTRAMUSCULAR | Status: AC
  Filled 2019-06-12: qty 20

## 2019-06-12 MED ORDER — WARFARIN SODIUM 5 MG PO TABS: 7.5000 mg | ORAL_TABLET | Freq: Every day | ORAL | 2 refills | Status: DC

## 2019-06-12 MED ORDER — OXYCODONE-ACETAMINOPHEN 5-325 MG PO TABS: 1.0000 | ORAL_TABLET | ORAL | 0 refills | Status: AC | PRN

## 2019-06-12 MED ORDER — LIDOCAINE HCL 1 % IJ SOLN
INTRAMUSCULAR | Status: DC | PRN
  Administered 2019-06-12: 10 mL

## 2019-06-12 MED ORDER — CYCLOBENZAPRINE HCL 7.5 MG PO TABS: 7.5000 mg | ORAL_TABLET | Freq: Three times a day (TID) | ORAL | 0 refills | Status: DC | PRN

## 2019-06-12 MED ORDER — FE FUMARATE-B12-VIT C-FA-IFC PO CAPS
1.0000 | ORAL_CAPSULE | Freq: Every day | ORAL | Status: DC
  Administered 2019-06-12: 08:00:00 1 via ORAL
  Filled 2019-06-12: qty 1

## 2019-06-12 NOTE — Progress Notes (Addendum)
      [address]       [place] [address]             [phone]      Subjective: Sitting up in bed after eating his breakfast. Says he feel well. Pain resolved last night with analgesics and has not returned.   Objective: Vital signs in last 24 hours: Temp:  [98 F (36.7 C)-99.6 F (37.6 C)] 99.3 F (37.4 C) (06/16 0724) Pulse Rate:  [93-120] 99 (06/16 0724) Cardiac Rhythm: Normal sinus rhythm (06/16 0300) Resp:  [18-25] 18 (06/16 0724) BP: (86-138)/(59-85) 118/74 (06/16 0724) SpO2:  [94 %-100 %] 96 % (06/16 0724)   Intake/Output from previous day: No intake/output data recorded. Intake/Output this shift: No intake/output data recorded.  General appearance: alert, cooperative and no distress Neurologic: intact Heart: regular rate and rhythm, monitor shows NSR with first degree AVB, unchanged. Lungs: Breath sounds are clear. Good sats on RA. Extremities: well perfused, trace pedal edema.   Lab Results: Recent Labs    06/11/19 1201 06/12/19 0245  WBC 18.6* 16.3*  HGB 9.6* 8.6*  HCT 29.7* 26.5*  PLT 878* 778*   BMET:  Recent Labs    06/11/19 1201  NA 137  K 4.8  CL 106  CO2 19*  GLUCOSE 132*  BUN 16  CREATININE 1.06  CALCIUM 8.7*    PT/INR:  Recent Labs    06/12/19 0245  LABPROT 18.6*  INR 1.6*   ABG    Component Value Date/Time   PHART 7.321 (L) 05/31/2019 1626   HCO3 17.6 (L) 05/31/2019 1626   TCO2 20 (L) 05/31/2019 2054   ACIDBASEDEF 8.0 (H) 05/31/2019 1626   O2SAT 94.0 05/31/2019 1626   CBG (last 3)  Recent Labs    06/11/19 1211  GLUCAP 114*    Assessment/Plan:  -Readmission 1 week post discharge following minimally invasive MVR for acute onset back pain radiating to anterior chest after several days of a progressive and uneventful recovery.  Work up including CTA and echo unrevealing. Pain has resolved. Suspect musculoskeletal source.    -Small-moderate pleural effusion-not unexpected following this  procedure. Plan thoracentesis by IR today.     -CAD, dyslipidemia--statin, b-blocker have been continued  -DVT PPX--Mobilize, continue enoxaprin  --Anticoagulation- coumadin resumed, INR 1.6, increase dose to 7.5mg  today.   LOS: 0 days    Antony Odea, PA-C [phone] 06/12/2019   I have seen and examined the patient and agree with the assessment and plan as outlined.  Plan d/c home later today after therapeutic right thoracentesis.  D/C instructions reviewed with patient.  All questions answered.  Rexene Alberts, MD 06/12/2019 8:23 AM

## 2019-06-12 NOTE — Progress Notes (Signed)
Pt given verbal discharge instructions. Paper copy of AVS also given to patient. IV's removed per orders. VSS at discharge. No further question from the patient. Patient did not have belongings. Patient discharged by RN via wheelchair through the [employer] entrance vehicle private vehicle.

## 2019-06-12 NOTE — Procedures (Signed)
PROCEDURE SUMMARY:  Successful image-guided right thoracentesis. Yielded 800 milliliters of dark red fluid. Patient tolerated procedure well. No immediate complications. EBL = 0 mL.  Specimen was sent for labs. CXR ordered.  Alexandra Louk PA-C 06/12/2019 10:05 AM

## 2019-06-12 NOTE — Discharge Instructions (Signed)
Chest Wall Pain Chest wall pain is pain in or around the bones and muscles of your chest. Sometimes, an injury causes this pain. Excessive coughing or overuse of arm and chest muscles may also cause chest wall pain. Sometimes, the cause may not be known. This pain may take several weeks or longer to get better. Follow these instructions at home: Managing pain, stiffness, and swelling   If directed, put ice on the painful area: ? Put ice in a plastic bag. ? Place a towel between your skin and the bag. ? Leave the ice on for 20 minutes, 2-3 times per day. Activity  Rest as told by your health care provider.  Avoid activities that cause pain. These include any activities that use your chest muscles or your abdominal and side muscles to lift heavy items. Ask your health care provider what activities are safe for you. General instructions   Take over-the-counter and prescription medicines only as told by your health care provider.  Do not use any products that contain nicotine or tobacco, such as cigarettes, e-cigarettes, and chewing tobacco. These can delay healing after injury. If you need help quitting, ask your health care provider.  Keep all follow-up visits as told by your health care provider. This is important. Contact a health care provider if:  You have a fever.  Your chest pain becomes worse.  You have new symptoms. Get help right away if:  You have nausea or vomiting.  You feel sweaty or light-headed.  You have a cough with mucus from your lungs (sputum) or you cough up blood.  You develop shortness of breath. These symptoms may represent a serious problem that is an emergency. Do not wait to see if the symptoms will go away. Get medical help right away. Call your local emergency services (911 in the [place] Do not drive yourself to the hospital. Summary  Chest wall pain is pain in or around the bones and muscles of your chest.  Depending on the cause, it may be  treated with ice, rest, medicines, and avoiding activities that cause pain.  Contact a health care provider if you have a fever, worsening chest pain, or new symptoms.  Get help right away if you feel light-headed or you develop shortness of breath. These symptoms may be an emergency. This information is not intended to replace advice given to you by your health care provider. Make sure you discuss any questions you have with your health care provider. Document Released: 12/13/2005 Document Revised: 06/15/2018 Document Reviewed: 06/15/2018 [employer] Interactive Patient Education  2019 [employer]   Acute Pain, Adult Acute pain is a type of pain that may last for just a few days or as long as six months. It is often related to an illness, injury, or medical procedure. Acute pain may be mild, moderate, or severe. It usually goes away once your injury has healed or you are no longer ill. Pain can make it hard for you to do daily activities. It can cause anxiety and lead to other problems if left untreated. Treatment depends on the cause and severity of your acute pain. Follow these instructions at home:  Check your pain level as told by your health care provider.  Take over-the-counter and prescription medicines only as told by your health care provider.  If you are taking prescription pain medicine: ? Ask your health care provider about taking a stool softener or laxative to prevent constipation. ? Do not stop taking the medicine suddenly.  Talk to your health care provider about how and when to discontinue prescription pain medicine. ? If your pain is severe, do not take more pills than instructed by your health care provider. ? Do not take other over-the-counter pain medicines in addition to this medicine unless told by your health care provider. ? Do not drive or operate heavy machinery while taking prescription pain medicine.  Apply ice or heat as told by your health care provider. These  may reduce swelling and pain.  Ask your health care provider if other strategies such as distraction, relaxation, or physical therapies can help your pain.  Keep all follow-up visits as told by your health care provider. This is important. Contact a health care provider if:  You have pain that is not controlled by medicine.  Your pain does not improve or gets worse.  You have side effects from pain medicines, such as vomitingor confusion. Get help right away if:  You have severe pain.  You have trouble breathing.  You lose consciousness.  You have chest pain or pressure that lasts for more than a few minutes. Along with the chest pain you may: ? Have pain or discomfort in one or both arms, your back, neck, jaw, or stomach. ? Have shortness of breath. ? Break out in a cold sweat. ? Feel nauseous. ? Become light-headed. These symptoms may represent a serious problem that is an emergency. Do not wait to see if the symptoms will go away. Get medical help right away. Call your local emergency services (911 in the [place] Do not drive yourself to the hospital. This information is not intended to replace advice given to you by your health care provider. Make sure you discuss any questions you have with your health care provider. Document Released: 12/28/2015 Document Revised: 05/21/2016 Document Reviewed: 12/28/2015 [employer] Interactive Patient Education  2019 [employer]   May remove the chest dressing (from the thoracentesis)  on 06/13/19 then may resume showering daily.

## 2019-06-12 NOTE — Discharge Summary (Signed)
Physician Discharge Summary  Patient ID: Calvin [name] MRN: [id number] DOB/AGE: 54/05/1966 54 y.o.  Admit date: 06/11/2019 Discharge date: 06/12/2019  Admission Diagnoses: Active Problems:   S/P minimally invasive mitral valve repair   Back pain   Chest pain   Pleural effusion on right   Coronary artery disease   Discharge Diagnoses:  Active Problems:   S/P minimally invasive mitral valve repair   Back pain   Chest pain   Pleural effusion on right, s/p ultrasound-guided right thoracentesis.   Coronary artery disease   Discharged Condition: good  History of Presenting Illness: Calvin [name] is a pleasant 53 year old male well-known to our service who has a history of coronary artery disease status post PTCI to the LAD and diagonal the past, severe mitral valve regurgitation due to prolapse status post minimally invasive mitral valve repair by Dr. Constance Goltzlen 05/31/2019.  He had a full postoperative course.  He was discharged in satisfactory condition on 06/05/2019.  He had continued to make aggressive without any concerns until this morning when he awoke with pain in chest and his back between the shoulder blades.  He did not recall any injury or activity that would have provoked this level of pain.  Denied any nausea or shortness of breath but did have some diaphoresis.  He summoned EMS and was taken [facility] emergency room [facility].  Initial work-up included an EKG that showed normal sinus rhythm with first-degree AV block.  Chest x-ray a small to moderate right pleural effusion with associated atelectasis and was otherwise unremarkable.  White blood count was 18,800.  Hematocrit was near 30%.  BMP was unremarkable.  His pain persisted after arrival to the ED.  IV narcotic analgesic the ER physician with some improvement.  Further work-up followed included a CT angiogram of the chest that showed no evidence of aortic dissection or PE.  An echocardiogram was obtained with results pending  at the time of this dictation.  Patient has been seen and examined by Dr. Cornelius Moraswen.  He feels the patient's symptoms most likely represent musculoskeletal pain but recommends admission for close observation.  This has been discussed with Calvin [name] and his wife.   Hospital Course: Calvin [name] was admitted for observation.  Pain was controlled with oral and IV analgesics and he did not have any recurrence as severe as the episode that prompted his presentation to the ED. Repeat WBC on the morning after admission was down to 16,000.  Blood Cx showed no growth at 24hours. The decision was made to address the right pleural effusion.  Ultrasound-guided thoracentesis was performed by interventional radiology on 06/12/19 yielding 800ml dark bloody fluid.  Follow up CXR showed improved aeration of the RLL and no pneumothorax. His cardiac rhythm remained stable.  Pleural fluid gram stain showed no organisms and fluid Cx was pending at the time of discharge. Prior to discharge he was counseled regarding activity, wound care, and pain management.    Significant Diagnostic Studies:   CLINICAL DATA:  Severe midline chest pain status post mitral valve replacement on 05/31/2019.  EXAM: CT ANGIOGRAPHY CHEST, ABDOMEN AND PELVIS  TECHNIQUE: Multidetector CT imaging through the chest, abdomen and pelvis was performed using the standard protocol during bolus administration of intravenous contrast. Multiplanar reconstructed images and MIPs were obtained and reviewed to evaluate the vascular anatomy.  CONTRAST:  100mL OMNIPAQUE IOHEXOL 350 MG/ML SOLN  COMPARISON:  CT dated 05/22/2019.  FINDINGS: CTA CHEST FINDINGS  Cardiovascular: The patient is status  post mitral valve replacement. Coronary artery calcifications and stents are noted. There is no significant pericardial effusion. The heart size is not significantly enlarged. There are mild atherosclerotic changes of the thoracic  aorta.  Mediastinum/Nodes: No enlarged mediastinal, hilar, or axillary lymph nodes. There is a mildly enlarged right hilar lymph node which is likely reactive. Thyroid gland, trachea, and esophagus demonstrate no significant findings.  Lungs/Pleura: There is a possible trace right-sided pneumothorax, however beam hardening artifact have a similar appearance from the first rib. No evidence of a left-sided pneumothorax. There is a moderate-sized right-sided pleural effusion. There is a trace left-sided pleural effusion. There is atelectasis at the left lung base. There is atelectasis involving much of the right lower lobe. There is a branching opacity in the left upper lobe, stable from prior CT. This is favored to represent inspissated mucous  Musculoskeletal: There is gas within the right pectoralis muscle. There is some fat stranding and asymmetric enlargement of the right pectoralis muscle. There is a small 4 by 1.4 cm collection anterior to the right pectoralis muscle favored to represent a small postoperative seroma or hematoma. There are pockets of subcutaneous gas along the patient's right flank.  Review of the MIP images confirms the above findings.  CTA ABDOMEN AND PELVIS FINDINGS  VASCULAR  Aorta: Normal caliber aorta without aneurysm, dissection, vasculitis or significant stenosis.  Celiac: There is severe stenosis at the origin of the celiac axis with mild poststenotic dilatation with the celiac trunk measuring approximately 1 cm in diameter.  SMA: Patent without evidence of aneurysm, dissection, vasculitis or significant stenosis.  Renals: Both renal arteries are patent without evidence of aneurysm, dissection, vasculitis, fibromuscular dysplasia or significant stenosis. There are 2 right renal arteries.  IMA: Patent without evidence of aneurysm, dissection, vasculitis or significant stenosis.  Inflow: Patent without evidence of aneurysm,  dissection, vasculitis or significant stenosis.  Veins: No obvious venous abnormality within the limitations of this arterial phase study.  Review of the MIP images confirms the above findings.  NON-VASCULAR  Hepatobiliary: No focal liver abnormality is seen. No gallstones, gallbladder wall thickening, or biliary dilatation.  Pancreas: Unremarkable. No pancreatic ductal dilatation or surrounding inflammatory changes.  Spleen: Normal in size without focal abnormality.  Adrenals/Urinary Tract: There is air within the urinary bladder. There is no hydronephrosis. No radiopaque obstructing kidney stones. The adrenal glands are unremarkable.  Stomach/Bowel: Stomach is within normal limits. Appendix appears normal. No evidence of bowel wall thickening, distention, or inflammatory changes.  Lymphatic: No significant vascular findings are present. No enlarged abdominal or pelvic lymph nodes.  Reproductive: Uterus and bilateral adnexa are unremarkable.  Other: There is some fat stranding in the right inguinal region which is likely related to recent intervention.  Musculoskeletal: No acute or significant osseous findings.  Review of the MIP images confirms the above findings.  IMPRESSION: 1. No dissection.  No pulmonary embolus. 2. Possible trace right-sided pneumothorax. 3. Moderate right-sided pleural effusion. 4. Bibasilar atelectasis, right worse than left. 5. Subcutaneous gas along the right pectoralis muscle with associated adjacent fat stranding and mild asymmetric enlargement of the right pectoralis major muscle, is likely all postsurgical and related to the patient's recent mitral valve replacement. 6. 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 7. There are few foci of gas within the urinary bladder. Correlation for recent instrumentation is recommended.   Electronically Signed   By: Constance Holster M.D.   On: 06/11/2019  14:45  CLINICAL DATA:  Post right thoracentesis  Result status: Final result     ECHOCARDIOGRAM REPORT       Patient Name:   Calvin [name] Date of Exam: 06/11/2019 Medical Rec #:  [id number]    Height:       73.0 in Accession #:    [id number]   Weight:       212.0 lb Date of Birth:  06/20/1966     BSA:          2.21 m Patient Age:    53 years     BP:           130/81 mmHg Patient Gender: M            HR:           100 bpm. Exam Location:  Inpatient    Procedure: 2D Echo  STAT ECHO  Indications:    Chest Pain 786.50 / R07.9   History:        Patient has prior history of Echocardiogram examinations, most                 recent 05/31/2019. CAD S/P minimally invasive mitral valve repair                 Mitral valve prolapse Signs/Symptoms: Dyspnea Risk Factors:                 Dyslipidemia. Mitral Valve: A repair valve is present in the                 mitral position. Procedure Date: 05/31/2019. Pleural effusion on                 right.   Sonographer:    Tobin Chadhelsea Androw Referring Phys: [id number] MYRON G RODDENBERRY    Sonographer Comments: Suboptimal subcostal window. IMPRESSIONS    1. The left ventricle has normal systolic function with an ejection fraction of 60-65%. The cavity size was normal. There is mildly increased left ventricular wall thickness. Left ventricular diastolic Doppler parameters are indeterminate. There is  abnormal septal motion consistent with post-operative status.  2. The lateral LV wall is not optimally visualized in all windows, however appears to have grossly normal function s/p minimally invasive mitral valve repair, as seen in parasternal short axis and parasternal long axis images, as well as clip 69, 73  apical long axis.  3. A repair valve is present in the mitral position. Procedure Date: 05/31/2019 There is no Echo evidence of dehiscence of the mitral prosthesis. Trivial mitral valve regurgitation.  4. Mean diastolic gradient through mitral  valve repair is 8 mmHg at HR 102 bpm.  FINDINGS  Left Ventricle: The left ventricle has normal systolic function, with an ejection fraction of 60-65%. The cavity size was normal. There is mildly increased left ventricular wall thickness. Left ventricular diastolic Doppler parameters are indeterminate.  There is abnormal (paradoxical) septal motion consistent with post-operative status. The lateral LV wall is not optimally visualized in all windows, however appears to have grossly normal function s/p minimally invasive mitral valve repair, as seen in  parasternal short axis and parasternal long axis images, as well as clip 69, 73 apical long axis.  Right Ventricle: The right ventricle has normal systolic function. The cavity was not assessed. There is right vetricular wall thickness was not assessed.  Left Atrium: Left atrial size was not well visualized.  Right Atrium: Right atrial size was normal in size.  Interatrial Septum: The interatrial septum was not well visualized.  Pericardium: There is no evidence of pericardial effusion.  Mitral Valve: The mitral valve has been repaired/replaced. Mitral valve regurgitation is trivial by color flow Doppler. A repair valve is present in the mitral position. Procedure Date: 05/31/2019 There is no Echo evidence of dehiscence of of the mitral  prosthesis.  Tricuspid Valve: The tricuspid valve is normal in structure. Tricuspid valve regurgitation is trivial by color flow Doppler.  Aortic Valve: The aortic valve is normal in structure. Aortic valve regurgitation is trivial by color flow Doppler. There is no evidence of aortic valve stenosis.  Pulmonic Valve: The pulmonic valve was not well visualized. Pulmonic valve regurgitation is trivial by color flow Doppler.  Venous: The inferior vena cava was not well visualized.    +--------------+--------++ LEFT VENTRICLE         +--------------+--------++ PLAX 2D                 +--------------+--------++ LVIDd:        4.70 cm  +--------------+--------++ LVIDs:        3.30 cm  +--------------+--------++ LV PW:        1.30 cm  +--------------+--------++ LV IVS:       1.30 cm  +--------------+--------++ LVOT diam:    2.20 cm  +--------------+--------++ LV SV:        58 ml    +--------------+--------++ LV SV Index:  25.98    +--------------+--------++ LVOT Area:    3.80 cm +--------------+--------++                        +--------------+--------++  +---------------+---------++ RIGHT VENTRICLE          +---------------+---------++ RV S prime:    6.09 cm/s +---------------+---------++  +-----------+-------++----------++ LEFT ATRIUM       Index      +-----------+-------++----------++ LA diam:   4.30 cm1.95 cm/m +-----------+-------++----------++ +------------+---------++-----------++ RIGHT ATRIUM         Index       +------------+---------++-----------++ RA Area:    13.00 cm            +------------+---------++-----------++ RA Volume:  30.90 ml 14.01 ml/m +------------+---------++-----------++  +------------+-----------++ AORTIC VALVE            +------------+-----------++ LVOT Vmax:  108.00 cm/s +------------+-----------++ LVOT Vmean: 72.600 cm/s +------------+-----------++ LVOT VTI:   0.195 m     +------------+-----------++   +-------------+-------++ AORTA                +-------------+-------++ Ao Root diam:3.60 cm +-------------+-------++  +--------------+----------++ MITRAL VALVE              +--------------+-------+ +--------------+----------++  SHUNTS                MV Area (PHT):5.42 cm    +--------------+-------+ +--------------+----------++  Systemic VTI: 0.20 m  MV Mean grad: 8.0 mmHg    +--------------+-------+ +--------------+----------++  Systemic Diam:2.20 cm MV PHT:       40.60 msec   +--------------+-------+ +--------------+----------++ MV Decel Time:140 msec   +--------------+----------++ +--------------+-----------++ MV E velocity:177.50 cm/s +--------------+-----------++ MV A velocity:170.00 cm/s +--------------+-----------++ MV E/A ratio: 1.04        +--------------+-----------++    Weston Brass MD Electronically signed by Weston Brass MD Signature Date/Time: 06/11/2019/3:48:11 PM      EXAM: CHEST  1 VIEW  COMPARISON:  06/11/2019.  FINDINGS: Decreased right pleural effusion post thoracentesis. No pneumothorax. Mild right lower lobe atelectasis. Left lung clear. Negative for heart failure.  IMPRESSION: No complication post right thoracentesis. Decreased  right pleural effusion.   Electronically Signed   By: Marlan Palau M.D.   On: 06/12/2019 10:40    Treatments:  INDICATION: Patient with history of minimally invasive mitral valve repair in OR 05/31/2019 by Dr. Cornelius Moras, dyspnea, and right-sided pleural effusion. Request is made for diagnostic and therapeutic right thoracentesis  EXAM: ULTRASOUND GUIDED DIAGNOSTIC AND THERAPEUTIC RIGHT THORACENTESIS  MEDICATIONS: 10 mL 1% lidocaine  COMPLICATIONS: None immediate.  PROCEDURE: An ultrasound guided thoracentesis was thoroughly discussed with the patient and questions answered. The benefits, risks, alternatives and complications were also discussed. The patient understands and wishes to proceed with the procedure. Written consent was obtained.  Ultrasound was performed to localize and mark an adequate pocket of fluid in the right chest. The area was then prepped and draped in the normal sterile fashion. 1% Lidocaine was used for local anesthesia. Under ultrasound guidance a 6 Fr Safe-T-Centesis catheter was introduced. Thoracentesis was performed. The catheter was removed and a dressing applied.  FINDINGS: A total of approximately 800 mL of dark  red fluid was removed. Samples were sent to the laboratory as requested by the clinical team.  IMPRESSION: Successful ultrasound guided right thoracentesis yielding 800 mL of pleural fluid.  Read by: Elwin Mocha, PA-C   Electronically Signed   By: Richarda Overlie M.D.   On: 06/12/2019 10:47  Discharge Exam: Blood pressure 114/67, pulse 99, temperature 99.3 F (37.4 C), temperature source Oral, resp. rate 18, SpO2 96 %.  General appearance: alert, cooperative and no distress Neurologic: intact Heart: regular rate and rhythm, monitor shows NSR with first degree AVB, unchanged. Lungs: Breath sounds are clear. Good sats on RA. Extremities: well perfused, trace pedal edema.   Disposition: Discharged to home in stable condition.    Allergies as of 06/12/2019      Reactions   Brilinta [ticagrelor] Other (See Comments)   GI bleed   Daliresp [roflumilast] Other (See Comments)   GI UPSET      Medication List    STOP taking these medications   acetaminophen 325 MG tablet Commonly known as: Tylenol     TAKE these medications   albuterol 108 (90 Base) MCG/ACT inhaler Commonly known as: ProAir HFA Inhale 2 puffs into the lungs every 6 (six) hours as needed for wheezing or shortness of breath.   amLODipine 5 MG tablet Commonly known as: NORVASC Take 1 tablet (5 mg total) by mouth daily. Please call and schedule an appointment for further refills 1st attempt   aspirin EC 81 MG tablet Take 81 mg by mouth daily.   cetirizine 10 MG tablet Commonly known as: ZYRTEC Take 10 mg by mouth daily as needed for allergies.   chlorhexidine 0.12 % solution Commonly known as: PERIDEX Rinse with 15 mls twice daily for 30 seconds. Use after breakfast and at bedtime. Spit out excess. Do not swallow. What changed:   how much to take  how to take this  when to take this   cyclobenzaprine 7.5 MG tablet Commonly known as: FEXMID Take 1 tablet (7.5 mg total) by mouth 3 (three) times  daily as needed for muscle spasms.   fenofibrate 160 MG tablet Take 160 mg by mouth daily.   fluticasone 0.05 % cream Commonly known as: CUTIVATE Apply 1 application topically every evening.   fluticasone 50 MCG/ACT nasal spray Commonly known as: FLONASE Place 1 spray into both nostrils every evening.   metoprolol tartrate 25 MG tablet Commonly known as: LOPRESSOR Take 0.5 tablets (12.5 mg total)  by mouth 2 (two) times daily.   mometasone-formoterol 100-5 MCG/ACT Aero Commonly known as: DULERA Inhale 1 puff into the lungs 2 (two) times daily.   multivitamin with minerals Tabs tablet Take 1 tablet by mouth daily.   oxyCODONE-acetaminophen 5-325 MG tablet Commonly known as: Percocet Take 1-2 tablets by mouth every 4 (four) hours as needed for up to 7 days for severe pain.   rosuvastatin 5 MG tablet Commonly known as: CRESTOR Take 1 tablet (5 mg total) by mouth daily.   warfarin 5 MG tablet Commonly known as: COUMADIN Take as directed. If you are unsure how to take this medication, talk to your nurse or doctor. Original instructions: Take 1.5 tablets (7.5 mg total) by mouth daily. Or as directed by the [facility]. Start taking on: June 13, 2019 What changed: how much to take      Follow-up Information    Purcell Nailswen, Clarence H, MD. Go on 07/19/2019.   Specialty: Cardiothoracic Surgery Why: You have an appointment with Dr. Cornelius Moraswen on Monday 07/19/19 at 9:30 am.  Please arrive 30 minutes early for a CXR to be done at [facility] located on the first floor of the same building. Contact information: [address] [place] [address] [phone]        Dyann KiefLenze, Michele M, PA-C Follow up on 06/19/2019.   Specialty: Cardiology Why: You have a virtual (telephone) visit scheduled with Jacolyn ReedyMichele Lenze, PA-C on Tuesday 06/19/19 at 10:45. Contact information: [address] [place] [address] [phone]        [facility] [address]  Office Follow up on 06/13/2019.   Specialty: Cardiology Why: Please keep your scheduled appointment with the [facility] 06/13/19 at 11:15am. Contact information: [address], [address] [place] [id number] [phone]          Signed: Leary RocaMyron G. Roddenberry, PA-C 06/12/2019, 1:50 PM

## 2019-06-13 ENCOUNTER — Other Ambulatory Visit: Payer: Self-pay | Admitting: Interventional Cardiology

## 2019-06-13 ENCOUNTER — Ambulatory Visit (INDEPENDENT_AMBULATORY_CARE_PROVIDER_SITE_OTHER): Payer: BC Managed Care – PPO | Admitting: *Deleted

## 2019-06-13 DIAGNOSIS — I824Y9 Acute embolism and thrombosis of unspecified deep veins of unspecified proximal lower extremity: Secondary | ICD-10-CM

## 2019-06-13 DIAGNOSIS — D6859 Other primary thrombophilia: Secondary | ICD-10-CM

## 2019-06-13 DIAGNOSIS — I4891 Unspecified atrial fibrillation: Secondary | ICD-10-CM | POA: Diagnosis not present

## 2019-06-13 DIAGNOSIS — I2510 Atherosclerotic heart disease of native coronary artery without angina pectoris: Secondary | ICD-10-CM | POA: Insufficient documentation

## 2019-06-13 DIAGNOSIS — Z7901 Long term (current) use of anticoagulants: Secondary | ICD-10-CM

## 2019-06-13 LAB — POCT INR: INR: 1.5 — AB (ref 2.0–3.0)

## 2019-06-13 LAB — PATHOLOGIST SMEAR REVIEW

## 2019-06-13 NOTE — Progress Notes (Signed)
Cardiology Office Note:    Date:  06/19/2019   ID:  Chrles [name], DOB 31-Jan-1966, MRN [id number]  PCP:  Calvin Pink, MD  Cardiologist:  Sinclair Grooms, MD  Electrophysiologist:  None   Referring MD: Calvin Pink, MD   No chief complaint on file. 53 yo male PMH CAD, minimally invasive MV repair presents for hospital follow up after re-admission for R pleural effusion. Chief complaint today of tachycardia.   History of Present Illness:    Calvin [name] is a 53 y.o. male with a hx of with history of coronary artery disease status post DES LAD and diagonal 02/2014, HLD  , severe mitral valve regurgitation due to prolapse status post minimally invasive mitral valve repair by Dr. Roxy Manns 05/31/2019.  Preop cath showed patent stents in the LAD & diagonal, 70-80% ostial Cfx, 50-60% mid Cfx, 40% prox-med RCA.  He was discharged in satisfactory condition on 06/05/2019    Patient readmitted with severe chest/back pain 06/11/19. CT negative for PE/dissection. WBC 18,000. BC negative. He underwent right-sided US guided thoracentesis for pleural effusion 6/16/ that yielded 800 ml dark bloody fluid. Echo with normal LVEF 60-65%,  Some paradoxical septal motion consistent with post op status. MV repair looks good.  He reports feeling well. No recurrent chest pain. Reports some SOB upon waking feels as if he can't "get a deep breath", but improves after waking. Encouraged him to continue to use his incentive spirometer. Denies DOE, wheeze. No dizziness, syncope, palpitations.   Reports some fatigue which he attributes to hospital stays. Has been trying to slowly increase activity. Notes HR remains 100-110 on his fitbit. Will increase Metoprolol today. BP at home have been 120s/80s - well controlled.   No bleeding complications on warfarin and aspirin '81mg'$  - denies hematuria, bruising, melena. Reviewed with Dr. Tamala Julian in the office - discontinue aspirin. Hb 8.6 on 06/12/19 - plan to recheck CBC today. May be  contributing to his tachycardia. Appt for INR check tomorrow moved to today with [facility].   Incision sites clean, dry without erythema or exudate.   Lipid profile 01/2019 with LDL 102. Dr. Tamala Julian re-initiated Crestor at that visit. Stopped for unclear reason. Recheck lipid profile and liver function today.   Of interest does note some paresthesia of upper extremities describes as "pins and needles" since surgery. Grip strengths strong bilaterally. Denies having dropped objects. Bilateral radial pulses 2+. Upper extremities are warm to the touch with <3 second capillary refill.    Past Medical History:  Diagnosis Date  . ACS (acute coronary syndrome) with NSTEMI 03/25/2014  . Acute sinus infection 03/27/2014  . Asthma   . Asthmatic bronchitis   . CAD (coronary artery disease), native coronary artery, DES placed to 1st diag. 03/26/14; residual disease to LAD with FFR to 0.62 and RCA of 50-60%- plan for PCI to LAD 03/27/2014  . Coronary artery disease   . Dyslipidemia, goal LDL below 70 03/27/2014  . GI bleed 03/27/2014  . Hyperlipemia   . Hypertension    Norvasc  . MVP (mitral valve prolapse)    Dr Ubaldo Glassing  . Non Q wave myocardial infarction ([place]) 03/23/2014  . Psoriasis   . Rhinitis   . S/P minimally invasive mitral valve repair 05/31/2019   Complex valvuloplasty including quadrangular resection of posterior leaflet, sliding leaflet annuloplasty, autologous pericardial patch augmentation of P1 segment of posterior leaflet and 34 mm Sorin Memo 4D ring annuloplasty via right mini thoracotomy approach    Past Surgical History:  Procedure Laterality Date  . CORONARY ANGIOPLASTY WITH STENT PLACEMENT  03/25/2014; 04/16/2014   "1 + 1"  . FLEXIBLE SIGMOIDOSCOPY Left 03/28/2014   Procedure: FLEXIBLE SIGMOIDOSCOPY;  Surgeon: Beryle Beams, MD;  Location: [facility];  Service: Endoscopy;  Laterality: Left;  . IR THORACENTESIS ASP PLEURAL SPACE W/IMG GUIDE  06/12/2019  . LEFT HEART CATHETERIZATION WITH  CORONARY ANGIOGRAM N/A 03/26/2014   Procedure: LEFT HEART CATHETERIZATION WITH CORONARY ANGIOGRAM;  Surgeon: Sinclair Grooms, MD;  Location: [facility] CATH LAB;  Service: Cardiovascular;  Laterality: N/A;  . MITRAL VALVE REPAIR Right 05/31/2019   Procedure: MINIMALLY INVASIVE MITRAL VALVE REPAIR (MVR) Using Memo 4D Ring Size 34MM;  Surgeon: Rexene Alberts, MD;  Location: [facility];  Service: Open Heart Surgery;  Laterality: Right;  . nasal polyps    . PERCUTANEOUS CORONARY STENT INTERVENTION (PCI-S) N/A 04/16/2014   Procedure: PERCUTANEOUS CORONARY STENT INTERVENTION (PCI-S);  Surgeon: Sinclair Grooms, MD;  Location[facility] CATH LAB;  Service: Cardiovascular;  Laterality: N/A;  . RIGHT/LEFT HEART CATH AND CORONARY ANGIOGRAPHY N/A 03/14/2019   Procedure: RIGHT/LEFT HEART CATH AND CORONARY ANGIOGRAPHY;  Surgeon: Belva Crome, MD;  Location: [facility] CV LAB;  Service: Cardiovascular;  Laterality: N/A;  . TEE WITHOUT CARDIOVERSION N/A 03/05/2019   Procedure: TRANSESOPHAGEAL ECHOCARDIOGRAM (TEE);  Surgeon: Skeet Latch, MD;  Location: [facility];  Service: Cardiovascular;  Laterality: N/A;  . TEE WITHOUT CARDIOVERSION N/A 05/31/2019   Procedure: TRANSESOPHAGEAL ECHOCARDIOGRAM (TEE);  Surgeon: Rexene Alberts, MD;  Location: [facility];  Service: Open Heart Surgery;  Laterality: N/A;  . WISDOM TOOTH EXTRACTION      Current Medications: Current Meds  Medication Sig  . albuterol (PROAIR HFA) 108 (90 BASE) MCG/ACT inhaler Inhale 2 puffs into the lungs every 6 (six) hours as needed for wheezing or shortness of breath.  Marland Kitchen amLODipine (NORVASC) 5 MG tablet TAKE 1 TABLET BY MOUTH DAILY.(PLEASE CALL AND SCHEDULE AN APPOINTMENT FOR FURTHER REFILLS)  . cetirizine (ZYRTEC) 10 MG tablet Take 10 mg by mouth daily as needed for allergies.   . chlorhexidine (PERIDEX) 0.12 % solution Rinse with 15 mls twice daily for 30 seconds. Use after breakfast and at bedtime. Spit out excess. Do not swallow. (Patient taking differently:  Use as directed 15 mLs in the mouth or throat 2 (two) times daily. Rinse with 15 mls twice daily for 30 seconds. Use after breakfast and at bedtime. Spit out excess. Do not swallow.)  . cyclobenzaprine (FEXMID) 7.5 MG tablet Take 1 tablet (7.5 mg total) by mouth 3 (three) times daily as needed for muscle spasms.  . fenofibrate 160 MG tablet Take 160 mg by mouth daily.   . fluticasone (CUTIVATE) 0.05 % cream Apply 1 application topically every evening.   . fluticasone (FLONASE) 50 MCG/ACT nasal spray Place 1 spray into both nostrils every evening.   . metoprolol tartrate (LOPRESSOR) 25 MG tablet Take 1 tablet (25 mg total) by mouth 2 (two) times daily.  . Multiple Vitamin (MULTIVITAMIN WITH MINERALS) TABS tablet Take 1 tablet by mouth daily.  Marland Kitchen oxyCODONE-acetaminophen (PERCOCET) 5-325 MG tablet Take 1-2 tablets by mouth every 4 (four) hours as needed for up to 7 days for severe pain.  . rosuvastatin (CRESTOR) 5 MG tablet Take 1 tablet (5 mg total) by mouth daily.  Marland Kitchen warfarin (COUMADIN) 5 MG tablet Take 1.5 tablets (7.5 mg total) by mouth daily. Or as directed by the [facility] Clinic.  . [DISCONTINUED] aspirin EC 81 MG tablet Take 81  mg by mouth daily.  . [DISCONTINUED] metoprolol tartrate (LOPRESSOR) 25 MG tablet Take 0.5 tablets (12.5 mg total) by mouth 2 (two) times daily.     Allergies:   Brilinta [ticagrelor] and Daliresp [roflumilast]   Social History   Socioeconomic History  . Marital status: Married    Spouse name: Not on file  . Number of children: 0  . Years of education: Not on file  . Highest education level: Not on file  Occupational History  . Occupation: Hotel manager  Social Needs  . Financial resource strain: Not on file  . Food insecurity    Worry: Not on file    Inability: Not on file  . Transportation needs    Medical: Not on file    Non-medical: Not on file  Tobacco Use  . Smoking status: Never Smoker  . Smokeless tobacco: Never Used  Substance and Sexual  Activity  . Alcohol use: No  . Drug use: No  . Sexual activity: Yes  Lifestyle  . Physical activity    Days per week: Not on file    Minutes per session: Not on file  . Stress: Not on file  Relationships  . Social Herbalist on phone: Not on file    Gets together: Not on file    Attends religious service: Not on file    Active member of club or organization: Not on file    Attends meetings of clubs or organizations: Not on file    Relationship status: Not on file  Other Topics Concern  . Not on file  Social History Narrative  . Not on file     Family History: The patient's family history includes Asthma in his maternal grandmother; Emphysema in his maternal grandmother; Heart disease in his maternal uncle and mother; Hypertension in his maternal grandmother; Prostate cancer in his paternal grandfather; Stroke in his maternal uncle. There is no history of Heart attack.  ROS:   Please see the history of present illness.    Review of Systems  Constitution: Negative for chills, fever and malaise/fatigue.  Cardiovascular: Negative for chest pain, dyspnea on exertion, irregular heartbeat, leg swelling, near-syncope, palpitations and syncope.  Respiratory: Positive for shortness of breath (upon waking. encouraged IS). Negative for cough and wheezing.   Neurological: Positive for paresthesias (bilateral upper extremity, see HPI). Negative for dizziness and light-headedness.     All other systems reviewed and are negative.  EKGs/Labs/Other Studies Reviewed:    The following studies were reviewed today:  CT ANGIOGRAPHY CHEST, ABDOMEN AND PELVIS   TECHNIQUE: Multidetector CT imaging through the chest, abdomen and pelvis was performed using the standard protocol during bolus administration of intravenous contrast. Multiplanar reconstructed images and MIPs were obtained and reviewed to evaluate the vascular anatomy.   CONTRAST:  128m OMNIPAQUE IOHEXOL 350 MG/ML SOLN    COMPARISON:  CT dated 05/22/2019.   FINDINGS: CTA CHEST FINDINGS   Cardiovascular: The patient is status post mitral valve replacement. Coronary artery calcifications and stents are noted. There is no significant pericardial effusion. The heart size is not significantly enlarged. There are mild atherosclerotic changes of the thoracic aorta.   Mediastinum/Nodes: No enlarged mediastinal, hilar, or axillary lymph nodes. There is a mildly enlarged right hilar lymph node which is likely reactive. Thyroid gland, trachea, and esophagus demonstrate no significant findings.   Lungs/Pleura: There is a possible trace right-sided pneumothorax, however beam hardening artifact have a similar appearance from the first rib. No evidence of  a left-sided pneumothorax. There is a moderate-sized right-sided pleural effusion. There is a trace left-sided pleural effusion. There is atelectasis at the left lung base. There is atelectasis involving much of the right lower lobe. There is a branching opacity in the left upper lobe, stable from prior CT. This is favored to represent inspissated mucous   Musculoskeletal: There is gas within the right pectoralis muscle. There is some fat stranding and asymmetric enlargement of the right pectoralis muscle. There is a small 4 by 1.4 cm collection anterior to the right pectoralis muscle favored to represent a small postoperative seroma or hematoma. There are pockets of subcutaneous gas along the patient's right flank.   Review of the MIP images confirms the above findings.   CTA ABDOMEN AND PELVIS FINDINGS   VASCULAR   Aorta: Normal caliber aorta without aneurysm, dissection, vasculitis or significant stenosis.   Celiac: There is severe stenosis at the origin of the celiac axis with mild poststenotic dilatation with the celiac trunk measuring approximately 1 cm in diameter.   SMA: Patent without evidence of aneurysm, dissection, vasculitis  or significant stenosis.   Renals: Both renal arteries are patent without evidence of aneurysm, dissection, vasculitis, fibromuscular dysplasia or significant stenosis. There are 2 right renal arteries.   IMA: Patent without evidence of aneurysm, dissection, vasculitis or significant stenosis.   Inflow: Patent without evidence of aneurysm, dissection, vasculitis or significant stenosis.   Veins: No obvious venous abnormality within the limitations of this arterial phase study.   Review of the MIP images confirms the above findings.   NON-VASCULAR   Hepatobiliary: No focal liver abnormality is seen. No gallstones, gallbladder wall thickening, or biliary dilatation.   Pancreas: Unremarkable. No pancreatic ductal dilatation or surrounding inflammatory changes.   Spleen: Normal in size without focal abnormality.   Adrenals/Urinary Tract: There is air within the urinary bladder. There is no hydronephrosis. No radiopaque obstructing kidney stones. The adrenal glands are unremarkable.   Stomach/Bowel: Stomach is within normal limits. Appendix appears normal. No evidence of bowel wall thickening, distention, or inflammatory changes.   Lymphatic: No significant vascular findings are present. No enlarged abdominal or pelvic lymph nodes.   Reproductive: Uterus and bilateral adnexa are unremarkable.   Other: There is some fat stranding in the right inguinal region which is likely related to recent intervention.   Musculoskeletal: No acute or significant osseous findings.   Review of the MIP images confirms the above findings.   IMPRESSION: 1. No dissection.  No pulmonary embolus. 2. Possible trace right-sided pneumothorax. 3. Moderate right-sided pleural effusion. 4. Bibasilar atelectasis, right worse than left. 5. Subcutaneous gas along the right pectoralis muscle with associated adjacent fat stranding and mild asymmetric enlargement of the right pectoralis major muscle,  is likely all postsurgical and related to the patient's recent mitral valve replacement. 6. 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 7. There are few foci of gas within the urinary bladder. Correlation for recent instrumentation is recommended.     Electronically Signed   By: Constance Holster M.D.   On: 06/11/2019 14:45   CLINICAL DATA:  Post right thoracentesis     Result status: Final result      ECHOCARDIOGRAM REPORT         Patient Name:   DAVYD PODGORSKI Date of Exam: 06/11/2019 Medical Rec #:  [id number]    Height:       73.0 in Accession #:    [id number]   Weight:  212.0 lb Date of Birth:  12-Dec-1966     BSA:          2.21 m Patient Age:    63 years     BP:           130/81 mmHg Patient Gender: M            HR:           100 bpm. Exam Location:  Inpatient     Procedure: 2D Echo   STAT ECHO   Indications:    Chest Pain 786.50 / R07.9   History:        Patient has prior history of Echocardiogram examinations, most                 recent 05/31/2019. CAD S/P minimally invasive mitral valve repair                 Mitral valve prolapse Signs/Symptoms: Dyspnea Risk Factors:                 Dyslipidemia. Mitral Valve: A repair valve is present in the                 mitral position. Procedure Date: 05/31/2019. Pleural effusion on                 right.   Sonographer:    [place] Coin Referring Phys: [place]     Sonographer Comments: Suboptimal subcostal window. IMPRESSIONS      1. The left ventricle has normal systolic function with an ejection fraction of 60-65%. The cavity size was normal. There is mildly increased left ventricular wall thickness. Left ventricular diastolic Doppler parameters are indeterminate. There is  abnormal septal motion consistent with post-operative status.  2. The lateral LV wall is not optimally visualized in all windows, however appears to have grossly normal function s/p minimally invasive mitral  valve repair, as seen in parasternal short axis and parasternal long axis images, as well as clip 69, 73  apical long axis.  3. A repair valve is present in the mitral position. Procedure Date: 05/31/2019 There is no Echo evidence of dehiscence of the mitral prosthesis. Trivial mitral valve regurgitation.  4. Mean diastolic gradient through mitral valve repair is 8 mmHg at HR 102 bpm.   FINDINGS  Left Ventricle: The left ventricle has normal systolic function, with an ejection fraction of 60-65%. The cavity size was normal. There is mildly increased left ventricular wall thickness. Left ventricular diastolic Doppler parameters are indeterminate.  There is abnormal (paradoxical) septal motion consistent with post-operative status. The lateral LV wall is not optimally visualized in all windows, however appears to have grossly normal function s/p minimally invasive mitral valve repair, as seen in  parasternal short axis and parasternal long axis images, as well as clip 69, 73 apical long axis.   Right Ventricle: The right ventricle has normal systolic function. The cavity was not assessed. There is right vetricular wall thickness was not assessed.   Left Atrium: Left atrial size was not well visualized.   Right Atrium: Right atrial size was normal in size.   Interatrial Septum: The interatrial septum was not well visualized.   Pericardium: There is no evidence of pericardial effusion.   Mitral Valve: The mitral valve has been repaired/replaced. Mitral valve regurgitation is trivial by color flow Doppler. A repair valve is present in the mitral position. Procedure Date: 05/31/2019 There is no Echo evidence of dehiscence  of of the mitral  prosthesis.   Tricuspid Valve: The tricuspid valve is normal in structure. Tricuspid valve regurgitation is trivial by color flow Doppler.   Aortic Valve: The aortic valve is normal in structure. Aortic valve regurgitation is trivial by color flow Doppler. There is  no evidence of aortic valve stenosis.   Pulmonic Valve: The pulmonic valve was not well visualized. Pulmonic valve regurgitation is trivial by color flow Doppler.   Venous: The inferior vena cava was not well visualized.          Left main contains up to 20% mid body narrowing.  The LAD contains luminal irregularities up to 30% of the proximal vessel and a widely patent stent in the proximal to mid vessel beyond the first diagonal.  The first diagonal stent is widely patent.  Ostial to proximal is eccentric 70 to 80%.  Circumflex coronary artery contains proximal to mid eccentric 50 to 60% narrowing.  RCA contains luminal irregularities up to 40% throughout the proximal to mid vessel.  RCA is dominant.  2-3+ mitral regurgitation by ventriculography.  Severe mitral regurgitation by transesophageal echo.  Normal left ventricular systolic function with ejection fraction 55%.  Right heart pressures are normal without significant V wave.   RECOMMENDATIONS:    Severe mitral regurgitation by echo in the setting of significant mitral valve prolapse.  Patient complains of dyspnea.  From hemodynamic and angiographic standpoint mitral regurgitation is is moderate.  Will refer to Dr. Roxy Manns for consideration of mitral valve repair given symptoms of dyspnea.  Symptoms seem out of proportion to hemodynamics but early repair has been shown to be better than after LV enlargement. Transesophageal echo 03/05/2019:   IMPRESSIONS      1. The left ventricle has normal systolic function, with an ejection fraction of 60-65%. The cavity size was normal. Left ventricular diastolic function could not be evaluated.  2. The right ventricle has normal systolc function. The cavity was normal. There is no increase in right ventricular wall thickness.  3. Left atrial size was mildly dilated.  4. Mitral valve regurgitation is severe by color flow Doppler. The MR jet is eccentric medially directed.  5. There is  severe mitral valve regurgitation with a regurgitant fraction of 54.60 %.  6. Severe prolapse of the P2 segment of the mitral valve.     PISA radius 0.97 cm. ERO 0.33 cm^2. Regurgitant volume 69.6 ml.  7. No stenosis of the aortic valve.  8. The ascending aorta and descending aorta are normal in size and structure.   EKG:   An ECG dated 06/11/19 was personally reviewed today and demonstrated:  NSR with ST [place] abn no acute change  Recent Labs: 02/08/2019: NT-Pro BNP 37 05/28/2019: ALT 37 06/01/2019: Magnesium 2.2 06/11/2019: BUN 16; Creatinine, Ser 1.06; Potassium 4.8; Sodium 137 06/12/2019: Hemoglobin 8.6; Platelets 778  Recent Lipid Panel    Component Value Date/Time   CHOL 185 02/08/2019 1039   TRIG 179 (H) 02/08/2019 1039   HDL 47 02/08/2019 1039   CHOLHDL 3.9 02/08/2019 1039   CHOLHDL 2 09/24/2015 0750   VLDL 14.4 09/24/2015 0750   LDLCALC 102 (H) 02/08/2019 1039    Physical Exam:    VS:  BP 118/64   Pulse (!) 106   Ht '6\' 1"'$  (1.854 m)   Wt 205 lb (93 kg)   SpO2 98%   BMI 27.05 kg/m     Wt Readings from Last 3 Encounters:  06/19/19 205 lb (93 kg)  06/05/19  212 lb (96.2 kg)  05/28/19 218 lb 11.2 oz (99.2 kg)     GEN:  Well nourished, well developed in no acute distress HEENT: Normal NECK: No JVD; No carotid bruits LYMPHATICS: No lymphadenopathy CARDIAC: RRR, no murmurs, rubs, gallops VVS: Bilateral radial pulses 2+. Bilateral DP pulses 2+ RESPIRATORY:  Clear to auscultation without rales, wheezing or rhonchi. RLL fine crackles ABDOMEN: Soft, non-tender, non-distended MUSCULOSKELETAL:  No edema; No deformity  SKIN: Warm and dry Surgical incisions clean, dry, intact NEUROLOGIC:  Alert and oriented x 3. Bilateral upper extremity grip strength 5/5, no drift, sensation intact.  PSYCHIATRIC:  Normal affect   ASSESSMENT:    1. Precordial pain   2. S/P minimally invasive mitral valve repair   3. Pleural effusion on right   4. Coronary artery disease involving native  coronary artery of native heart without angina pectoris   5. Dyslipidemia, goal LDL below 70   6. Long term (current) use of anticoagulants    PLAN:    In order of problems listed above:  1. S/P minimally invasive MV repair - Appt with Dr. Roxy Manns next month. Incision sites healing well.  2. CAD S/P DES LAD & Dx 2015, cath 02/2019 patents stents with residual nonobstructive disease as above - Stable, no anginal symptoms. GDMT beta blocker, statin.   Aspirin discontinued today after discussion with Dr. Tamala Julian as he is on Warfarin due to risk of bleeding.  Increase Metoprolol due to tachycardia.  3. Recurrent chest pain leading to another hospitalization-S/P thoracentesis for R pleural effusion - No recurrence of chest pain. RLL fine crackles. Encouraged incentive spirometry.   Recheck CBC for WBC as well as Hb. May be contributing to tachycardia.  4. HLD - Lipid profile 01/2019 LDL 102, Crestor initiated. Recheck lipid and liver today. Continue Crestor, fenofibrate. 5. Long term (current) use of anticoagulants - Denies bleeding complications. Warfarin managed by [facility]. INR 2.0 today.     Medication Adjustments/Labs and Tests Ordered: Current medicines are reviewed at length with the patient today.  Concerns regarding medicines are outlined above.  Orders Placed This Encounter  Procedures  . Comp Met (CMET)  . CBC  . Lipid Profile   Meds ordered this encounter  Medications  . metoprolol tartrate (LOPRESSOR) 25 MG tablet    Sig: Take 1 tablet (25 mg total) by mouth 2 (two) times daily.    Dispense:  180 tablet    Refill:  3    Patient Instructions  Medication Instructions:  Your physician has recommended you make the following change in your medication:   1. STOP: Aspirin  2. INCREASE: metoprolol to 25 mg twice a day   Lab work: TODAY: CBC, CMET, LIPIDS  If you have labs (blood work) drawn today and your tests are completely normal, you will receive your results  only by: Marland Kitchen MyChart Message (if you have MyChart) OR . A paper copy in the mail If you have any lab test that is abnormal or we need to change your treatment, we will call you to review the results.  Testing/Procedures: None ordered  Follow-Up: . Follow up with Dr. Tamala Julian on 07/17/19 at 9:00 AM  Any Other Special Instructions Will Be Listed Below (If Applicable).       Signed, Loel Dubonnet, NP  06/19/2019 11:39 AM    [place]

## 2019-06-13 NOTE — Patient Instructions (Signed)
Description   Take 1.5 tablets today, then start taking 1 tablet daily except for 1/2 tablet on Sundays and Tuesdays. Be consistent with 5 servings of dark green leafy vegetables per week.  Recheck in 1 week. [facility] [phone] and [facility] [phone]

## 2019-06-16 LAB — CULTURE, BLOOD (ROUTINE X 2)
Culture: NO GROWTH
Culture: NO GROWTH
Special Requests: ADEQUATE
Special Requests: ADEQUATE

## 2019-06-17 LAB — CULTURE, BODY FLUID W GRAM STAIN -BOTTLE: Culture: NO GROWTH

## 2019-06-18 ENCOUNTER — Telehealth: Payer: Self-pay

## 2019-06-18 NOTE — Telephone Encounter (Signed)
Patient will come in the office tomorrow for his appt. Patient answers no to all screening questions below. Patient understands that there are no visitors allowed. Patient understands not to arrive more than 15 minutes prior to the scheduled appointment time and that a mask will be required for the visit.      COVID-19 Pre-Screening Questions:  . In the past 7 to 10 days have you had a cough,  shortness of breath, headache, congestion, fever (100 or greater) body aches, chills, sore throat, or sudden loss of taste or sense of smell? NO . Have you been around anyone with known Covid 19? NO . Have you been around anyone who is awaiting Covid 19 test results in the past 7 to 10 days? NO . Have you been around anyone who has been exposed to Covid 19, or has mentioned symptoms of Covid 19 within the past 7 to 10 days? NO

## 2019-06-19 ENCOUNTER — Ambulatory Visit (INDEPENDENT_AMBULATORY_CARE_PROVIDER_SITE_OTHER): Payer: BC Managed Care – PPO

## 2019-06-19 ENCOUNTER — Ambulatory Visit: Payer: BC Managed Care – PPO | Admitting: Physician Assistant

## 2019-06-19 ENCOUNTER — Encounter: Payer: Self-pay | Admitting: Physician Assistant

## 2019-06-19 ENCOUNTER — Other Ambulatory Visit: Payer: Self-pay

## 2019-06-19 VITALS — BP 118/64 | HR 106 | Ht 73.0 in | Wt 205.0 lb

## 2019-06-19 DIAGNOSIS — I2510 Atherosclerotic heart disease of native coronary artery without angina pectoris: Secondary | ICD-10-CM | POA: Diagnosis not present

## 2019-06-19 DIAGNOSIS — J90 Pleural effusion, not elsewhere classified: Secondary | ICD-10-CM

## 2019-06-19 DIAGNOSIS — Z7901 Long term (current) use of anticoagulants: Secondary | ICD-10-CM | POA: Diagnosis not present

## 2019-06-19 DIAGNOSIS — I824Y9 Acute embolism and thrombosis of unspecified deep veins of unspecified proximal lower extremity: Secondary | ICD-10-CM

## 2019-06-19 DIAGNOSIS — R072 Precordial pain: Secondary | ICD-10-CM

## 2019-06-19 DIAGNOSIS — I4891 Unspecified atrial fibrillation: Secondary | ICD-10-CM

## 2019-06-19 DIAGNOSIS — Z9889 Other specified postprocedural states: Secondary | ICD-10-CM

## 2019-06-19 DIAGNOSIS — D6859 Other primary thrombophilia: Secondary | ICD-10-CM

## 2019-06-19 DIAGNOSIS — E785 Hyperlipidemia, unspecified: Secondary | ICD-10-CM | POA: Diagnosis not present

## 2019-06-19 LAB — POCT INR: INR: 2 (ref 2.0–3.0)

## 2019-06-19 LAB — CBC

## 2019-06-19 MED ORDER — METOPROLOL TARTRATE 25 MG PO TABS: 25.0000 mg | ORAL_TABLET | Freq: Two times a day (BID) | ORAL | 3 refills | Status: DC

## 2019-06-19 NOTE — Patient Instructions (Signed)
Description   Start taking 1 tablet daily except for 1/2 tablet on Sundays. Be consistent with 5 servings of dark green leafy vegetables per week. Recheck in 1 week. [facility] [phone] and [facility] [phone]

## 2019-06-19 NOTE — Patient Instructions (Signed)
Medication Instructions:  Your physician has recommended you make the following change in your medication:   1. STOP: Aspirin  2. INCREASE: metoprolol to 25 mg twice a day   Lab work: TODAY: CBC, CMET, LIPIDS  If you have labs (blood work) drawn today and your tests are completely normal, you will receive your results only by: Marland Kitchen MyChart Message (if you have MyChart) OR . A paper copy in the mail If you have any lab test that is abnormal or we need to change your treatment, we will call you to review the results.  Testing/Procedures: None ordered  Follow-Up: . Follow up with Dr. Tamala Julian on 07/17/19 at 9:00 AM  Any Other Special Instructions Will Be Listed Below (If Applicable).

## 2019-06-20 ENCOUNTER — Telehealth: Payer: Self-pay | Admitting: Interventional Cardiology

## 2019-06-20 ENCOUNTER — Telehealth: Payer: Self-pay

## 2019-06-20 LAB — COMPREHENSIVE METABOLIC PANEL
ALT: 34 IU/L (ref 0–44)
AST: 23 IU/L (ref 0–40)
Albumin/Globulin Ratio: 1.7 (ref 1.2–2.2)
Albumin: 4 g/dL (ref 3.8–4.9)
Alkaline Phosphatase: 167 IU/L — ABNORMAL HIGH (ref 39–117)
BUN/Creatinine Ratio: 13 (ref 9–20)
BUN: 14 mg/dL (ref 6–24)
Bilirubin Total: 0.3 mg/dL (ref 0.0–1.2)
CO2: 18 mmol/L — ABNORMAL LOW (ref 20–29)
Calcium: 9.3 mg/dL (ref 8.7–10.2)
Chloride: 104 mmol/L (ref 96–106)
Creatinine, Ser: 1.11 mg/dL (ref 0.76–1.27)
GFR calc Af Amer: 87 mL/min/{1.73_m2} (ref >59)
GFR calc non Af Amer: 75 mL/min/{1.73_m2} (ref >59)
Globulin, Total: 2.4 g/dL (ref 1.5–4.5)
Glucose: 94 mg/dL (ref 65–99)
Potassium: 5.1 mmol/L (ref 3.5–5.2)
Sodium: 139 mmol/L (ref 134–144)
Total Protein: 6.4 g/dL (ref 6.0–8.5)

## 2019-06-20 LAB — CBC
Hematocrit: 34.3 % — ABNORMAL LOW (ref 37.5–51.0)
Hemoglobin: 11.2 g/dL — ABNORMAL LOW (ref 13.0–17.7)
MCH: 29 pg (ref 26.6–33.0)
MCHC: 32.7 g/dL (ref 31.5–35.7)
MCV: 89 fL (ref 79–97)
Platelets: 1035 10*3/uL (ref 150–450)
RBC: 3.86 x10E6/uL — ABNORMAL LOW (ref 4.14–5.80)
RDW: 13.4 % (ref 11.6–15.4)
WBC: 12.1 10*3/uL — ABNORMAL HIGH (ref 3.4–10.8)

## 2019-06-20 LAB — LIPID PANEL
Chol/HDL Ratio: 4.6 ratio (ref 0.0–5.0)
Cholesterol, Total: 138 mg/dL (ref 100–199)
HDL: 30 mg/dL — ABNORMAL LOW (ref >39)
LDL Calculated: 83 mg/dL (ref 0–99)
Triglycerides: 123 mg/dL (ref 0–149)
VLDL Cholesterol Cal: 25 mg/dL (ref 5–40)

## 2019-06-20 NOTE — Telephone Encounter (Signed)

## 2019-06-20 NOTE — Telephone Encounter (Signed)
Addressed with Dr. Tamala Julian, he stated no changes and requested having the lab report sent to him.

## 2019-06-20 NOTE — Progress Notes (Signed)
Reviewed with Dr. Tamala Julian.   Platelet count is elevated. Possibly as an inflammatory response after recent surgery and thoracentesis. Please have patient restart Aspirin 81mg  daily and continue his Warfarin, per Dr. Tamala Julian to help decrease platelet count. Repeat CBC in 2 weeks.   Electrolytes and kidney function look good.  Lipid panel improved from previous. Continue Crestor 5mg  daily. Encourage low salt, low fat, heart healthy diet.

## 2019-06-20 NOTE — Telephone Encounter (Signed)
Crystal from [employer] was calling to report a critical lab value   Critical level was the platelets with a reading of 1035.

## 2019-06-21 ENCOUNTER — Telehealth: Payer: Self-pay | Admitting: Interventional Cardiology

## 2019-06-21 ENCOUNTER — Telehealth: Payer: Self-pay | Admitting: Cardiovascular Disease

## 2019-06-21 DIAGNOSIS — Z7901 Long term (current) use of anticoagulants: Secondary | ICD-10-CM

## 2019-06-21 MED ORDER — ASPIRIN EC 81 MG PO TBEC: 81.0000 mg | DELAYED_RELEASE_TABLET | Freq: Every day | ORAL | 3 refills | Status: AC

## 2019-06-21 NOTE — Telephone Encounter (Signed)
The patient has been notified of the results and recommendations and verbalized understanding. Patient will restart ASA 81 mg QD and continue coumadin. CBC ordered and appointment made for 7/8. Patient will continue Crestor 5mg  daily. Discussed low salt, low fat, and heart healthy diet.  All questions (if any) were answered. Cleon Gustin, RN 06/21/2019 12:52 PM

## 2019-06-21 NOTE — Telephone Encounter (Signed)
New message:    Patient calling concerning some results he stated that some one called him yesterday. Please call patient back.

## 2019-06-21 NOTE — Telephone Encounter (Signed)
Encounter not needed

## 2019-06-21 NOTE — Telephone Encounter (Signed)
-----   Message from Loel Dubonnet, NP sent at 06/20/2019  5:02 PM EDT ----- Reviewed with Dr. Tamala Julian.   Platelet count is elevated. Possibly as an inflammatory response after recent surgery and thoracentesis. Please have patient restart Aspirin 81mg  daily and continue his Warfarin, per Dr. Tamala Julian to help decrease platelet count. Repeat CBC in 2 weeks.   Electrolytes and kidney function look good.  Lipid panel improved from previous. Continue Crestor 5mg  daily. Encourage low salt, low fat, heart healthy diet.

## 2019-06-27 ENCOUNTER — Ambulatory Visit (INDEPENDENT_AMBULATORY_CARE_PROVIDER_SITE_OTHER): Payer: BC Managed Care – PPO | Admitting: *Deleted

## 2019-06-27 ENCOUNTER — Telehealth: Payer: Self-pay

## 2019-06-27 ENCOUNTER — Other Ambulatory Visit: Payer: Self-pay

## 2019-06-27 DIAGNOSIS — D6859 Other primary thrombophilia: Secondary | ICD-10-CM | POA: Diagnosis not present

## 2019-06-27 DIAGNOSIS — I824Y9 Acute embolism and thrombosis of unspecified deep veins of unspecified proximal lower extremity: Secondary | ICD-10-CM

## 2019-06-27 DIAGNOSIS — I4891 Unspecified atrial fibrillation: Secondary | ICD-10-CM | POA: Diagnosis not present

## 2019-06-27 DIAGNOSIS — Z7901 Long term (current) use of anticoagulants: Secondary | ICD-10-CM

## 2019-06-27 LAB — POCT INR: INR: 2.3 (ref 2.0–3.0)

## 2019-06-27 NOTE — Patient Instructions (Signed)
Description   Start taking 1 tablet daily except for 1/2 tablet on Sundays. Be consistent with 5 servings of dark green leafy vegetables per week. Recheck in 1 week. [facility] #[phone] and [facility] #[phone]     

## 2019-06-27 NOTE — Telephone Encounter (Signed)

## 2019-07-04 ENCOUNTER — Ambulatory Visit (INDEPENDENT_AMBULATORY_CARE_PROVIDER_SITE_OTHER): Payer: BC Managed Care – PPO | Admitting: *Deleted

## 2019-07-04 ENCOUNTER — Other Ambulatory Visit: Payer: BC Managed Care – PPO

## 2019-07-04 ENCOUNTER — Other Ambulatory Visit: Payer: Self-pay

## 2019-07-04 ENCOUNTER — Other Ambulatory Visit: Payer: Self-pay | Admitting: Thoracic Surgery (Cardiothoracic Vascular Surgery)

## 2019-07-04 DIAGNOSIS — Z9889 Other specified postprocedural states: Secondary | ICD-10-CM

## 2019-07-04 DIAGNOSIS — I824Y9 Acute embolism and thrombosis of unspecified deep veins of unspecified proximal lower extremity: Secondary | ICD-10-CM

## 2019-07-04 DIAGNOSIS — D6859 Other primary thrombophilia: Secondary | ICD-10-CM | POA: Diagnosis not present

## 2019-07-04 DIAGNOSIS — I4891 Unspecified atrial fibrillation: Secondary | ICD-10-CM | POA: Diagnosis not present

## 2019-07-04 DIAGNOSIS — Z7901 Long term (current) use of anticoagulants: Secondary | ICD-10-CM

## 2019-07-04 LAB — CBC
Hematocrit: 35.8 % — ABNORMAL LOW (ref 37.5–51.0)
Hemoglobin: 11.5 g/dL — ABNORMAL LOW (ref 13.0–17.7)
MCH: 28.3 pg (ref 26.6–33.0)
MCHC: 32.1 g/dL (ref 31.5–35.7)
MCV: 88 fL (ref 79–97)
Platelets: 442 10*3/uL (ref 150–450)
RBC: 4.07 x10E6/uL — ABNORMAL LOW (ref 4.14–5.80)
RDW: 14 % (ref 11.6–15.4)
WBC: 11 10*3/uL — ABNORMAL HIGH (ref 3.4–10.8)

## 2019-07-04 LAB — POCT INR: INR: 2.2 (ref 2.0–3.0)

## 2019-07-04 NOTE — Patient Instructions (Signed)
Description   Continue taking 1 tablet daily except for 1/2 tablet on Sundays. Be consistent with 5 servings of dark green leafy vegetables per week. Recheck in 2 weeks. [facility] [phone] and [facility] [phone]

## 2019-07-09 ENCOUNTER — Ambulatory Visit
Admission: RE | Admit: 2019-07-09 | Discharge: 2019-07-09 | Disposition: A | Discharge status: Home / Self Care | Payer: BC Managed Care – PPO | Source: Ambulatory Visit | Attending: Thoracic Surgery (Cardiothoracic Vascular Surgery) | Admitting: Thoracic Surgery (Cardiothoracic Vascular Surgery)

## 2019-07-09 ENCOUNTER — Encounter: Payer: Self-pay | Admitting: Thoracic Surgery (Cardiothoracic Vascular Surgery)

## 2019-07-09 ENCOUNTER — Ambulatory Visit (INDEPENDENT_AMBULATORY_CARE_PROVIDER_SITE_OTHER): Payer: Self-pay | Admitting: Thoracic Surgery (Cardiothoracic Vascular Surgery)

## 2019-07-09 ENCOUNTER — Other Ambulatory Visit: Payer: Self-pay

## 2019-07-09 VITALS — BP 119/84 | HR 105 | Temp 97.9°F | Resp 16 | Ht 73.0 in | Wt 203.0 lb

## 2019-07-09 DIAGNOSIS — Z9889 Other specified postprocedural states: Secondary | ICD-10-CM

## 2019-07-09 DIAGNOSIS — I340 Nonrheumatic mitral (valve) insufficiency: Secondary | ICD-10-CM

## 2019-07-09 DIAGNOSIS — J9811 Atelectasis: Secondary | ICD-10-CM | POA: Diagnosis not present

## 2019-07-09 DIAGNOSIS — I341 Nonrheumatic mitral (valve) prolapse: Secondary | ICD-10-CM

## 2019-07-09 DIAGNOSIS — Z7901 Long term (current) use of anticoagulants: Secondary | ICD-10-CM

## 2019-07-09 NOTE — Patient Instructions (Signed)
Continue all previous medications without any changes at this time  You may continue to gradually increase your physical activity as tolerated.  Refrain from any heavy lifting or strenuous use of your arms and shoulders until at least 8 weeks from the time of your surgery, and avoid activities that cause increased pain in your chest on the side of your surgical incision.  Otherwise you may continue to increase activities without any particular limitations.  Increase the intensity and duration of physical activity gradually.  You may return to driving an automobile as long as you are no longer requiring oral narcotic pain relievers during the daytime.  It would be wise to start driving only short distances during the daylight and gradually increase from there as you feel comfortable.  Endocarditis is a potentially serious infection of heart valves or inside lining of the heart.  It occurs more commonly in patients with diseased heart valves (such as patient's with aortic or mitral valve disease) and in patients who have undergone heart valve repair or replacement.  Certain surgical and dental procedures may put you at risk, such as dental cleaning, other dental procedures, or any surgery involving the respiratory, urinary, gastrointestinal tract, gallbladder or prostate gland.   To minimize your chances for develooping endocarditis, maintain good oral health and seek prompt medical attention for any infections involving the mouth, teeth, gums, skin or urinary tract.    Always notify your doctor or dentist about your underlying heart valve condition before having any invasive procedures. You will need to take antibiotics before certain procedures, including all routine dental cleanings or other dental procedures.  Your cardiologist or dentist should prescribe these antibiotics for you to be taken ahead of time.       

## 2019-07-09 NOTE — Progress Notes (Signed)
[address]       [place] [id number]             [phone]     CARDIOTHORACIC SURGERY OFFICE NOTE  Referring Provider is Lyn RecordsSmith, Henry W, MD PCP is Jerl MinaHedrick, James, MD   HPI:  Patient has a 53 year old male with history of mitral valve prolapse and mitral regurgitation, coronary artery disease status post acute non-ST segment elevation myocardial infarction in 2015 treated with PCI and stenting of the diagonal branch,chronic sinusitis,and hyperlipidemia who returns to the office today for routine follow-up status post minimally invasive mitral valve repair on May 31, 2019.  The patient's early postoperative recovery was uneventful and he was discharged home on the fifth postoperative day.  Approximately 1 week later he presented to the emergency department with severe chest and back pain consistent with acute exacerbation of musculoskeletal pain related to his surgery.  He underwent a thorough diagnostic evaluation in the emergency department including CT angiogram of the chest and transthoracic echocardiogram.  CT angiogram of the chest was entirely unremarkable and echocardiogram revealed intact mitral valve repair with normal left ventricular function and no significant pericardial effusion.  The patient's symptoms of pain resolved very quickly with pain medications and he was discharged home the following day.  He returns to the office today for routine follow-up.  [facility]  His Coumadin dosing has been managed through the Coumadin clinic with most recent INR reported 2.2.  He returns to our office today for routine follow-up and reports that he is doing exceptionally well.  He no longer has any significant pain or soreness in his chest.  Numbness and other feelings he had previously experienced in his shoulders and across both arms is almost completely resolved as the soreness in his chest resolved.  He is walking  twice a day and he already appreciates that he does not get short of breath as easily as he did prior to surgery.  Overall he is quite pleased with his progress   Current Outpatient Medications  Medication Sig Dispense Refill   albuterol (PROAIR HFA) 108 (90 BASE) MCG/ACT inhaler Inhale 2 puffs into the lungs every 6 (six) hours as needed for wheezing or shortness of breath. 1 Inhaler prn   amLODipine (NORVASC) 5 MG tablet TAKE 1 TABLET BY MOUTH DAILY.(PLEASE CALL AND SCHEDULE AN APPOINTMENT FOR FURTHER REFILLS) 90 tablet 2   aspirin EC 81 MG tablet Take 1 tablet (81 mg total) by mouth daily. 90 tablet 3   cetirizine (ZYRTEC) 10 MG tablet Take 10 mg by mouth daily as needed for allergies.      chlorhexidine (PERIDEX) 0.12 % solution Rinse with 15 mls twice daily for 30 seconds. Use after breakfast and at bedtime. Spit out excess. Do not swallow. (Patient taking differently: Use as directed 15 mLs in the mouth or throat 2 (two) times daily. Rinse with 15 mls twice daily for 30 seconds. Use after breakfast and at bedtime. Spit out excess. Do not swallow.) 480 mL prn   cyclobenzaprine (FEXMID) 7.5 MG tablet Take 1 tablet (7.5 mg total) by mouth 3 (three) times daily as needed for muscle spasms. 30 tablet 0   fenofibrate 160 MG tablet Take 160 mg by mouth daily.      fluticasone (CUTIVATE) 0.05 % cream Apply 1 application topically every evening.      fluticasone (FLONASE) 50 MCG/ACT nasal spray Place  1 spray into both nostrils every evening.      metoprolol tartrate (LOPRESSOR) 25 MG tablet Take 1 tablet (25 mg total) by mouth 2 (two) times daily. 180 tablet 3   Multiple Vitamin (MULTIVITAMIN WITH MINERALS) TABS tablet Take 1 tablet by mouth daily.     rosuvastatin (CRESTOR) 5 MG tablet Take 1 tablet (5 mg total) by mouth daily. 90 tablet 3   warfarin (COUMADIN) 5 MG tablet Take 1.5 tablets (7.5 mg total) by mouth daily. Or as directed by the [facility]madin Clinic. 40 tablet 2   No current  facility-administered medications for this visit.       Physical Exam:   BP 119/84 (BP Location: Right Arm, Patient Position: Sitting, Cuff Size: Normal)    Pulse (!) 105    Temp 97.9 F (36.6 C) Comment: THERMAL   Resp 16    Ht 6\' 1"  (1.854 m)    Wt 203 lb (92.1 kg)    SpO2 96% Comment: RA   BMI 26.78 kg/m   General:  Well-appearing  Chest:   Clear to auscultation  CV:   Regular rate and rhythm without murmur  Incisions:  Healing nicely  Abdomen:  Soft nontender  Extremities:  Warm and well-perfused  Diagnostic Tests:  CHEST - 2 VIEW  COMPARISON:  06/12/2019  FINDINGS: Lungs are adequately inflated with subtle blunting of the right costophrenic angle. Minimal opacification in the medial right base likely mild atelectasis. Evidence of patient's recent mitral valve replacement. Cardiomediastinal silhouette and remainder the exam is unchanged.  IMPRESSION: Tiny amount right pleural fluid and minimal medial right base atelectasis. Evidence of recent mitral valve surgery.   Electronically Signed   By: Marin Olp M.D.   On: 07/09/2019 09:15    Impression:  Patient is doing very well just over 1 month following minimally invasive mitral valve repair  Plan:  I have not recommended any changes to the patient's current medications.  As long as he continues to maintain sinus rhythm he probably could stop taking warfarin in approximately 2 months.  He should remain on aspirin 81 mg/day indefinitely because of his history of coronary artery disease.  I have encouraged the patient to continue to gradually increase his physical activity without any particular limitations.  I have reminded the patient that typically patients who undergo mitral valve repair for longstanding severe mitral regurgitation have higher resting pulse rate than they did prior to surgery.  This is related to the mild functional mitral stenosis and decreased stroke-volume caused by valve repair and will  likely gradually decrease over time to some degree.  I have released the patient to return to work.  The patient has been reminded regarding the importance of dental hygiene and the lifelong need for antibiotic prophylaxis for all dental cleanings and other related invasive procedures.  The patient will return to our office for routine follow-up in approximately 3 months.  He will call and return sooner should specific problems or questions arise.    Valentina Gu. Roxy Manns, MD 07/09/2019 9:46 AM

## 2019-07-13 ENCOUNTER — Other Ambulatory Visit: Payer: Self-pay

## 2019-07-16 ENCOUNTER — Telehealth: Payer: Self-pay | Admitting: Interventional Cardiology

## 2019-07-16 NOTE — Progress Notes (Signed)
Cardiology Office Note:    Date:  07/17/2019   ID:  Calvin [name], DOB 06/05/1966, MRN [id number]  PCP:  Jerl MinaHedrick, James, MD  Cardiologist:  Lesleigh NoeHenry W Laszlo Ellerby III, MD   Referring MD: Jerl MinaHedrick, James, MD   Chief Complaint  Patient presents with  . Coronary Artery Disease  . Cardiac Valve Problem    History of Present Illness:    Calvin [name] is a 53 y.o. male with a hx of history of coronary artery disease status post DES LAD and diagonal 02/2014, HLD  , severe mitral valve regurgitation due to prolapse status post minimally invasive mitral valve repair by Dr. Cornelius Moraswen 05/31/2019. Preop cath showed patent stents in the LAD & diagonal, 70-80% ostial Cfx, 50-60% mid Cfx, 40% prox-med RCA, Readmitted 6/15 with right. pleural effusion.  Calvin [name] is still struggling somewhat with breathing.  More recently notes that he is having no difficulty with exertional activities.  And in fact his breathing is better.  He is now waking up some nights and with wheezing and needing to use bronchodilator therapy.  No chest pain.  No significant palpitations.  He does note that metoprolol has been increased to 25 mg/day.  He does not have lower extremity swelling.  No chills or fever.  He is not coughing up any phlegm.  Past Medical History:  Diagnosis Date  . ACS (acute coronary syndrome) with NSTEMI 03/25/2014  . Acute sinus infection 03/27/2014  . Asthma   . Asthmatic bronchitis   . CAD (coronary artery disease), native coronary artery, DES placed to 1st diag. 03/26/14; residual disease to LAD with FFR to 0.62 and RCA of 50-60%- plan for PCI to LAD 03/27/2014  . Coronary artery disease   . Dyslipidemia, goal LDL below 70 03/27/2014  . GI bleed 03/27/2014  . Hyperlipemia   . Hypertension    Norvasc  . MVP (mitral valve prolapse)    Dr Lady GaryFath  . Non Q wave myocardial infarction (HCC) 03/23/2014  . Psoriasis   . Rhinitis   . S/P minimally invasive mitral valve repair 05/31/2019   Complex valvuloplasty including quadrangular  resection of posterior leaflet, sliding leaflet annuloplasty, autologous pericardial patch augmentation of P1 segment of posterior leaflet and 34 mm Sorin Memo 4D ring annuloplasty via right mini thoracotomy approach    Past Surgical History:  Procedure Laterality Date  . CORONARY ANGIOPLASTY WITH STENT PLACEMENT  03/25/2014; 04/16/2014   "1 + 1"  . FLEXIBLE SIGMOIDOSCOPY Left 03/28/2014   Procedure: FLEXIBLE SIGMOIDOSCOPY;  Surgeon: Theda BelfastPatrick D Hung, MD;  [facility];  Service: Endoscopy;  Laterality: Left;  . IR THORACENTESIS ASP PLEURAL SPACE W/IMG GUIDE  06/12/2019  . LEFT HEART CATHETERIZATION WITH CORONARY ANGIOGRAM N/A 03/26/2014   Procedure: LEFT HEART CATHETERIZATION WITH CORONARY ANGIOGRAM;  Surgeon: Lesleigh NoeHenry W Kavi Almquist III, MD;  [facility];  Service: Cardiovascular;  Laterality: N/A;  . MITRAL VALVE REPAIR Right 05/31/2019   Procedure: MINIMALLY INVASIVE MITRAL VALVE REPAIR (MVR) Using Memo 4D Ring Size 34MM;  Surgeon: Purcell Nailswen, Clarence H, MD;  [facility];  Service: Open Heart Surgery;  Laterality: Right;  . nasal polyps    . PERCUTANEOUS CORONARY STENT INTERVENTION (PCI-S) N/A 04/16/2014   Procedure: PERCUTANEOUS CORONARY STENT INTERVENTION (PCI-S);  Surgeon: Lesleigh NoeHenry W Alyzah Pelly III, MD;  [facility];  Service: Cardiovascular;  Laterality: N/A;  . RIGHT/LEFT HEART CATH AND CORONARY ANGIOGRAPHY N/A 03/14/2019   Procedure: RIGHT/LEFT HEART CATH AND CORONARY ANGIOGRAPHY;  Surgeon: Lyn RecordsSmith, Lukus Binion W, MD;  Location:  [facility] INVASIVE CV LAB;  Service: Cardiovascular;  Laterality: N/A;  . TEE WITHOUT CARDIOVERSION N/A 03/05/2019   Procedure: TRANSESOPHAGEAL ECHOCARDIOGRAM (TEE);  Surgeon: Chilton Siandolph, Tiffany, MD;  [facility];  Service: Cardiovascular;  Laterality: N/A;  . TEE WITHOUT CARDIOVERSION N/A 05/31/2019   Procedure: TRANSESOPHAGEAL ECHOCARDIOGRAM (TEE);  Surgeon: Purcell Nailswen, Clarence H, MD;  [facility];  Service: Open Heart Surgery;  Laterality: N/A;  . WISDOM TOOTH  EXTRACTION      Current Medications: Current Meds  Medication Sig  . albuterol (PROAIR HFA) 108 (90 BASE) MCG/ACT inhaler Inhale 2 puffs into the lungs every 6 (six) hours as needed for wheezing or shortness of breath.  Marland Kitchen. amLODipine (NORVASC) 5 MG tablet TAKE 1 TABLET BY MOUTH DAILY.(PLEASE CALL AND SCHEDULE AN APPOINTMENT FOR FURTHER REFILLS)  . aspirin EC 81 MG tablet Take 1 tablet (81 mg total) by mouth daily.  . cetirizine (ZYRTEC) 10 MG tablet Take 10 mg by mouth daily as needed for allergies.   . chlorhexidine (PERIDEX) 0.12 % solution Rinse with 15 mls twice daily for 30 seconds. Use after breakfast and at bedtime. Spit out excess. Do not swallow.  . cyclobenzaprine (FEXMID) 7.5 MG tablet Take 1 tablet (7.5 mg total) by mouth 3 (three) times daily as needed for muscle spasms.  . fenofibrate 160 MG tablet Take 160 mg by mouth daily.   . fluticasone (CUTIVATE) 0.05 % cream Apply 1 application topically every evening.   . fluticasone (FLONASE) 50 MCG/ACT nasal spray Place 1 spray into both nostrils every evening.   . metoprolol tartrate (LOPRESSOR) 25 MG tablet Take 0.5 tablets (12.5 mg total) by mouth 2 (two) times daily.  . Multiple Vitamin (MULTIVITAMIN WITH MINERALS) TABS tablet Take 1 tablet by mouth daily.  Marland Kitchen. warfarin (COUMADIN) 5 MG tablet Take 1.5 tablets (7.5 mg total) by mouth daily. Or as directed by the Coumadin Clinic.  . [DISCONTINUED] metoprolol tartrate (LOPRESSOR) 25 MG tablet Take 1 tablet (25 mg total) by mouth 2 (two) times daily.  . [DISCONTINUED] rosuvastatin (CRESTOR) 5 MG tablet Take 1 tablet (5 mg total) by mouth daily.     Allergies:   Brilinta [ticagrelor] and Daliresp [roflumilast]   Social History   Socioeconomic History  . Marital status: Married    Spouse name: Not on file  . Number of children: 0  . Years of education: Not on file  . Highest education level: Not on file  Occupational History  . Occupation: Insurance underwritercomputer tech  Social Needs  . Financial  resource strain: Not on file  . Food insecurity    Worry: Not on file    Inability: Not on file  . Transportation needs    Medical: Not on file    Non-medical: Not on file  Tobacco Use  . Smoking status: Never Smoker  . Smokeless tobacco: Never Used  Substance and Sexual Activity  . Alcohol use: No  . Drug use: No  . Sexual activity: Yes  Lifestyle  . Physical activity    Days per week: Not on file    Minutes per session: Not on file  . Stress: Not on file  Relationships  . Social Musicianconnections    Talks on phone: Not on file    Gets together: Not on file    Attends religious service: Not on file    Active member of club or organization: Not on file    Attends meetings of clubs or organizations: Not on file    Relationship status: Not  on file  Other Topics Concern  . Not on file  Social History Narrative  . Not on file     Family History: The patient's family history includes Asthma in his maternal grandmother; Emphysema in his maternal grandmother; Heart disease in his maternal uncle and mother; Hypertension in his maternal grandmother; Prostate cancer in his paternal grandfather; Stroke in his maternal uncle. There is no history of Heart attack.  ROS:   Please see the history of present illness.    No fever or chills.  Appetite is stable.  Increasing physical activity is being well tolerated.  Oxygen saturation today as noted below.  All other systems reviewed and are negative.  EKGs/Labs/Other Studies Reviewed:    The following studies were reviewed today: No new data  EKG:  EKG 06/11/2019, sinus tachycardia 105 bpm, borderline first-degree AV block, minimal lateral ST elevation.  Recent Labs: 02/08/2019: NT-Pro BNP 37 06/01/2019: Magnesium 2.2 06/19/2019: ALT 34; BUN 14; Creatinine, Ser 1.11; Potassium 5.1; Sodium 139 07/04/2019: Hemoglobin 11.5; Platelets 442  Recent Lipid Panel    Component Value Date/Time   CHOL 138 06/19/2019 1127   TRIG 123 06/19/2019 1127    HDL 30 (L) 06/19/2019 1127   CHOLHDL 4.6 06/19/2019 1127   CHOLHDL 2 09/24/2015 0750   VLDL 14.4 09/24/2015 0750   LDLCALC 83 06/19/2019 1127    Physical Exam:    VS:  BP 116/74   Pulse 97   Ht 6\' 1"  (1.854 m)   Wt 212 lb (96.2 kg)   SpO2 96%   BMI 27.97 kg/m     Wt Readings from Last 3 Encounters:  07/17/19 212 lb (96.2 kg)  07/09/19 203 lb (92.1 kg)  06/19/19 205 lb (93 kg)     GEN: Mild pallor. No acute distress HEENT: Normal NECK: No JVD. LYMPHATICS: No lymphadenopathy CARDIAC:  RRR without murmur, gallop, or edema. VASCULAR:  Normal Pulses. No bruits. RESPIRATORY:  Clear to auscultation without rales, wheezing or rhonchi  ABDOMEN: Soft, non-tender, non-distended, No pulsatile mass, MUSCULOSKELETAL: No deformity  SKIN: Warm and dry NEUROLOGIC:  Alert and oriented x 3 PSYCHIATRIC:  Normal affect   ASSESSMENT:    1. S/P minimally invasive mitral valve repair   2. Atrial fibrillation, unspecified type (HCC)   3. Long term (current) use of anticoagulants   4. Coronary artery disease involving native coronary artery of native heart without angina pectoris   5. Dyslipidemia, goal LDL below 70   6. Essential hypertension   7. Educated About Covid-19 Virus Infection    PLAN:    In order of problems listed above:  1. No murmurs heard.  Doing well post surgery other than the right pleural effusion that required drainage.  Now having some wheezing that is likely related to reactive airways disease aggravated by beta-blocker therapy.  We will decrease metoprolol to 12.5 mg twice daily and may need to discontinue the medication.  I have encouraged him to use albuterol as needed. 2. No recent episodes of atrial fibrillation. 3. Continue warfarin therapy.  He is also on aspirin. 4. Stable without angina.  Secondary prevention discussed in detail. 5. Increase rosuvastatin to 10 mg/day to achieve target LDL lower than 70.  Liver and lipid panel in 2 months. 6. Blood pressure  is excellent. 7. Masking, social distancing, and handwashing stressed.  Letter is written to his company to request additional time to heal the pulmonary process before exposure to others.  Overall education and awareness concerning primary/secondary risk prevention  was discussed in detail: LDL less than 70, hemoglobin A1c less than 7, blood pressure target less than 130/80 mmHg, >150 minutes of moderate aerobic activity per week, avoidance of smoking, weight control (via diet and exercise), and continued surveillance/management of/for obstructive sleep apnea.   Lower the dose of beta-blocker to see if intermittent wheezing resolves.   Medication Adjustments/Labs and Tests Ordered: Current medicines are reviewed at length with the patient today.  Concerns regarding medicines are outlined above.  No orders of the defined types were placed in this encounter.  Meds ordered this encounter  Medications  . metoprolol tartrate (LOPRESSOR) 25 MG tablet    Sig: Take 0.5 tablets (12.5 mg total) by mouth 2 (two) times daily.    Dispense:  90 tablet    Refill:  3    Dose change    Patient Instructions  Medication Instructions:  1) DECREASE Metoprolol Tartrate to 12.5mg  twice daily  If you need a refill on your cardiac medications before your next appointment, please call your pharmacy.   Lab work: None If you have labs (blood work) drawn today and your tests are completely normal, you will receive your results only by: Marland Kitchen MyChart Message (if you have MyChart) OR . A paper copy in the mail If you have any lab test that is abnormal or we need to change your treatment, we will call you to review the results.  Testing/Procedures: None  Follow-Up: At [facility], you and your health needs are our priority.  As part of our continuing mission to provide you with exceptional heart care, we have created designated Provider Care Teams.  These Care Teams include your primary Cardiologist  (physician) and Advanced Practice Providers (APPs -  Physician Assistants and Nurse Practitioners) who all work together to provide you with the care you need, when you need it. You will need a follow up appointment in 3 months.  Please call our office 2 months in advance to schedule this appointment.  You may see Sinclair Grooms, MD or one of the following Advanced Practice Providers on your designated Care Team:   Truitt Merle, NP Cecilie Kicks, NP . Kathyrn Drown, NP  Any Other Special Instructions Will Be Listed Below (If Applicable).       Signed, Sinclair Grooms, MD  07/17/2019 9:25 AM    [facility]

## 2019-07-16 NOTE — Telephone Encounter (Signed)
New Message    Unable to leave message, phone would only ring Called to confirm appt and answer covid questions

## 2019-07-17 ENCOUNTER — Ambulatory Visit: Payer: BC Managed Care – PPO | Admitting: Interventional Cardiology

## 2019-07-17 ENCOUNTER — Encounter: Payer: Self-pay | Admitting: Interventional Cardiology

## 2019-07-17 ENCOUNTER — Encounter: Payer: Self-pay | Admitting: *Deleted

## 2019-07-17 ENCOUNTER — Other Ambulatory Visit: Payer: Self-pay

## 2019-07-17 ENCOUNTER — Ambulatory Visit (INDEPENDENT_AMBULATORY_CARE_PROVIDER_SITE_OTHER): Payer: BC Managed Care – PPO

## 2019-07-17 ENCOUNTER — Telehealth: Payer: Self-pay | Admitting: Interventional Cardiology

## 2019-07-17 VITALS — BP 116/74 | HR 97 | Ht 73.0 in | Wt 212.0 lb

## 2019-07-17 DIAGNOSIS — I10 Essential (primary) hypertension: Secondary | ICD-10-CM

## 2019-07-17 DIAGNOSIS — Z9889 Other specified postprocedural states: Secondary | ICD-10-CM

## 2019-07-17 DIAGNOSIS — Z7189 Other specified counseling: Secondary | ICD-10-CM

## 2019-07-17 DIAGNOSIS — D6859 Other primary thrombophilia: Secondary | ICD-10-CM | POA: Diagnosis not present

## 2019-07-17 DIAGNOSIS — I824Y9 Acute embolism and thrombosis of unspecified deep veins of unspecified proximal lower extremity: Secondary | ICD-10-CM | POA: Diagnosis not present

## 2019-07-17 DIAGNOSIS — Z7901 Long term (current) use of anticoagulants: Secondary | ICD-10-CM

## 2019-07-17 DIAGNOSIS — I4891 Unspecified atrial fibrillation: Secondary | ICD-10-CM

## 2019-07-17 DIAGNOSIS — E785 Hyperlipidemia, unspecified: Secondary | ICD-10-CM

## 2019-07-17 DIAGNOSIS — I2510 Atherosclerotic heart disease of native coronary artery without angina pectoris: Secondary | ICD-10-CM | POA: Diagnosis not present

## 2019-07-17 LAB — POCT INR: INR: 1.8 — AB (ref 2.0–3.0)

## 2019-07-17 MED ORDER — ROSUVASTATIN CALCIUM 10 MG PO TABS: 10.0000 mg | ORAL_TABLET | Freq: Every day | ORAL | 3 refills | Status: DC

## 2019-07-17 MED ORDER — METOPROLOL TARTRATE 25 MG PO TABS: 12.5000 mg | ORAL_TABLET | Freq: Two times a day (BID) | ORAL | 3 refills | Status: DC

## 2019-07-17 NOTE — Patient Instructions (Signed)
Description   Take 1.5 tablets today, then start taking 1 tablet daily.  Be consistent with 5 servings of dark green leafy vegetables per week. Recheck in 3 weeks. [facility] [phone] and [facility] [phone]

## 2019-07-17 NOTE — Telephone Encounter (Signed)
  Date: 07/17/2019  To whom it may concern,  Calvin [name] (date of birth [birth year]) has recently undergone open heart surgery for valve repair.  He has had pulmonary complications following surgery.  I recommend that he work from home until October to allow full healing with decreased risk of contracting COVID-19.  If I may provide further information, do not hesitate to call.  Sincerely,   Farrel Gordon, MD, [facility]

## 2019-07-17 NOTE — Patient Instructions (Addendum)
Medication Instructions:  1) DECREASE Metoprolol Tartrate to 12.5mg  twice daily 2) INCREASE Rosuvastatin to 10mg  once daily  If you need a refill on your cardiac medications before your next appointment, please call your pharmacy.   Lab work: Your physician recommends that you return for lab work in: 2 months (Lipid, liver).  You will need to be fasting for these labs (nothing to eat or drink after midnight except water and black coffee).  If you have labs (blood work) drawn today and your tests are completely normal, you will receive your results only by: Marland Kitchen MyChart Message (if you have MyChart) OR . A paper copy in the mail If you have any lab test that is abnormal or we need to change your treatment, we will call you to review the results.  Testing/Procedures: None  Follow-Up: At [facility], you and your health needs are our priority.  As part of our continuing mission to provide you with exceptional heart care, we have created designated Provider Care Teams.  These Care Teams include your primary Cardiologist (physician) and Advanced Practice Providers (APPs -  Physician Assistants and Nurse Practitioners) who all work together to provide you with the care you need, when you need it. You will need a follow up appointment in 3 months.  Please call our office 2 months in advance to schedule this appointment.  You may see Sinclair Grooms, MD or one of the following Advanced Practice Providers on your designated Care Team:   Truitt Merle, NP Cecilie Kicks, NP . Kathyrn Drown, NP  Any Other Special Instructions Will Be Listed Below (If Applicable).

## 2019-08-06 ENCOUNTER — Other Ambulatory Visit: Payer: Self-pay

## 2019-08-06 DIAGNOSIS — R059 Cough, unspecified: Secondary | ICD-10-CM

## 2019-08-06 DIAGNOSIS — R062 Wheezing: Secondary | ICD-10-CM

## 2019-08-06 DIAGNOSIS — R05 Cough: Secondary | ICD-10-CM

## 2019-08-07 ENCOUNTER — Ambulatory Visit (INDEPENDENT_AMBULATORY_CARE_PROVIDER_SITE_OTHER): Payer: BC Managed Care – PPO | Admitting: Pharmacist

## 2019-08-07 ENCOUNTER — Other Ambulatory Visit: Payer: Self-pay

## 2019-08-07 ENCOUNTER — Ambulatory Visit
Admission: RE | Admit: 2019-08-07 | Discharge: 2019-08-07 | Disposition: A | Discharge status: Home / Self Care | Payer: BC Managed Care – PPO | Source: Ambulatory Visit | Attending: Thoracic Surgery (Cardiothoracic Vascular Surgery) | Admitting: Thoracic Surgery (Cardiothoracic Vascular Surgery)

## 2019-08-07 DIAGNOSIS — Z7901 Long term (current) use of anticoagulants: Secondary | ICD-10-CM

## 2019-08-07 DIAGNOSIS — D6859 Other primary thrombophilia: Secondary | ICD-10-CM | POA: Diagnosis not present

## 2019-08-07 DIAGNOSIS — R05 Cough: Secondary | ICD-10-CM | POA: Diagnosis not present

## 2019-08-07 DIAGNOSIS — I4891 Unspecified atrial fibrillation: Secondary | ICD-10-CM

## 2019-08-07 DIAGNOSIS — I824Y9 Acute embolism and thrombosis of unspecified deep veins of unspecified proximal lower extremity: Secondary | ICD-10-CM

## 2019-08-07 LAB — POCT INR: INR: 1.6 — AB (ref 2.0–3.0)

## 2019-08-07 NOTE — Patient Instructions (Signed)
Take 1.5 tablets today and tomorrow, then start taking 1 tablet daily except 1.5 tablets on Fridays.  Be consistent with 5 servings of dark green leafy vegetables per week. Recheck in 2 weeks. [facility] [phone] and [facility] [phone]

## 2019-08-13 ENCOUNTER — Other Ambulatory Visit: Payer: Self-pay | Admitting: Thoracic Surgery (Cardiothoracic Vascular Surgery)

## 2019-08-13 ENCOUNTER — Ambulatory Visit: Payer: Self-pay | Admitting: Thoracic Surgery (Cardiothoracic Vascular Surgery)

## 2019-08-13 ENCOUNTER — Telehealth: Payer: Self-pay

## 2019-08-13 ENCOUNTER — Ambulatory Visit: Payer: Self-pay

## 2019-08-13 ENCOUNTER — Ambulatory Visit: Payer: BC Managed Care – PPO

## 2019-08-13 DIAGNOSIS — Z9889 Other specified postprocedural states: Secondary | ICD-10-CM

## 2019-08-13 NOTE — Telephone Encounter (Signed)
-----   Message from Rexene Alberts, MD sent at 08/13/2019  1:36 PM EDT ----- Regarding: [name]: [name] - will f/u lab work when it comes in ----- Message ----- From: Donnella Sham, RN Sent: 08/13/2019  11:59 AM EDT To: Rexene Alberts, MD Subject: Calvin [name],  Patient was to be seen in office today for SOB/ cough/ wheezing with PA.  Patient was confused to which lab work he needed done before the appointment.  He is going to get lab work done today.  Tried to [name]-schedule his appointment to tomorrow, but patient was unable to make it "with work".  Stated he was feeling better.  I advised to go ahead and get the lab work and I would make you aware.  If anything concerning we could have him come in at that point.  Is that ok? Please advise. Chest xray has already been done.  Thanks,  Caryl Pina

## 2019-08-15 DIAGNOSIS — R062 Wheezing: Secondary | ICD-10-CM | POA: Diagnosis not present

## 2019-08-15 DIAGNOSIS — R05 Cough: Secondary | ICD-10-CM | POA: Diagnosis not present

## 2019-08-15 LAB — CBC WITH DIFFERENTIAL/PLATELET
Absolute Monocytes: 495 cells/uL (ref 200–950)
Basophils Absolute: 143 cells/uL (ref 0–200)
Basophils Relative: 1.9 %
Eosinophils Absolute: 1703 cells/uL — ABNORMAL HIGH (ref 15–500)
Eosinophils Relative: 22.7 %
HCT: 42.1 % (ref 38.5–50.0)
Hemoglobin: 13.6 g/dL (ref 13.2–17.1)
Lymphs Abs: 2288 cells/uL (ref 850–3900)
MCH: 28 pg (ref 27.0–33.0)
MCHC: 32.3 g/dL (ref 32.0–36.0)
MCV: 86.8 fL (ref 80.0–100.0)
MPV: 10.1 fL (ref 7.5–12.5)
Monocytes Relative: 6.6 %
Neutro Abs: 2873 cells/uL (ref 1500–7800)
Neutrophils Relative %: 38.3 %
Platelets: 457 10*3/uL — ABNORMAL HIGH (ref 140–400)
RBC: 4.85 10*6/uL (ref 4.20–5.80)
RDW: 14.1 % (ref 11.0–15.0)
Total Lymphocyte: 30.5 %
WBC: 7.5 10*3/uL (ref 3.8–10.8)

## 2019-08-15 LAB — BASIC METABOLIC PANEL
BUN: 15 mg/dL (ref 7–25)
CO2: 24 mmol/L (ref 20–32)
Calcium: 9.9 mg/dL (ref 8.6–10.3)
Chloride: 109 mmol/L (ref 98–110)
Creat: 1.08 mg/dL (ref 0.70–1.33)
Glucose, Bld: 102 mg/dL — ABNORMAL HIGH (ref 65–99)
Potassium: 4.7 mmol/L (ref 3.5–5.3)
Sodium: 140 mmol/L (ref 135–146)

## 2019-08-15 LAB — BRAIN NATRIURETIC PEPTIDE: Brain Natriuretic Peptide: 120 pg/mL — ABNORMAL HIGH (ref <100)

## 2019-08-20 ENCOUNTER — Telehealth: Payer: Self-pay

## 2019-08-20 NOTE — Telephone Encounter (Signed)
-----   Message from Rexene Alberts, MD sent at 08/19/2019  6:10 AM EDT ----- Regarding: RE: lab results All lab work normal, perfectly normal ----- Message ----- From: Marylen Ponto, LPN Sent: 07/24/2059   2:32 PM EDT To: Rexene Alberts, MD Subject: lab results                                    Mr Purdom is calling for Lab results from 08/15/19. Please review and advise Thanks Linden Dolin

## 2019-08-21 ENCOUNTER — Other Ambulatory Visit: Payer: Self-pay

## 2019-08-21 ENCOUNTER — Ambulatory Visit (INDEPENDENT_AMBULATORY_CARE_PROVIDER_SITE_OTHER): Payer: BC Managed Care – PPO | Admitting: *Deleted

## 2019-08-21 DIAGNOSIS — I4891 Unspecified atrial fibrillation: Secondary | ICD-10-CM

## 2019-08-21 DIAGNOSIS — D6859 Other primary thrombophilia: Secondary | ICD-10-CM | POA: Diagnosis not present

## 2019-08-21 DIAGNOSIS — I824Y9 Acute embolism and thrombosis of unspecified deep veins of unspecified proximal lower extremity: Secondary | ICD-10-CM | POA: Diagnosis not present

## 2019-08-21 DIAGNOSIS — Z7901 Long term (current) use of anticoagulants: Secondary | ICD-10-CM

## 2019-08-21 LAB — POCT INR: INR: 1.8 — AB (ref 2.0–3.0)

## 2019-08-21 NOTE — Patient Instructions (Signed)
Description   Take 1.5 tablets today, then start taking 1 tablet daily except 1.5 tablets on Tuesdays and Fridays.  Be consistent with 5 servings of dark green leafy vegetables per week. Recheck in 2 weeks. [facility] [phone] and [facility] [phone]

## 2019-09-04 ENCOUNTER — Other Ambulatory Visit: Payer: Self-pay

## 2019-09-04 ENCOUNTER — Ambulatory Visit (INDEPENDENT_AMBULATORY_CARE_PROVIDER_SITE_OTHER): Payer: BC Managed Care – PPO | Admitting: *Deleted

## 2019-09-04 DIAGNOSIS — I4891 Unspecified atrial fibrillation: Secondary | ICD-10-CM | POA: Diagnosis not present

## 2019-09-04 DIAGNOSIS — Z7901 Long term (current) use of anticoagulants: Secondary | ICD-10-CM

## 2019-09-04 DIAGNOSIS — D6859 Other primary thrombophilia: Secondary | ICD-10-CM

## 2019-09-04 DIAGNOSIS — I824Y9 Acute embolism and thrombosis of unspecified deep veins of unspecified proximal lower extremity: Secondary | ICD-10-CM

## 2019-09-04 LAB — POCT INR: INR: 2.3 (ref 2.0–3.0)

## 2019-09-04 NOTE — Patient Instructions (Signed)
Description   Continue taking 1 tablet daily except 1.5 tablets on Tuesdays and Fridays.  Be consistent with 5 servings of dark green leafy vegetables per week. Recheck in 3 weeks. [facility] [phone] and [facility] [phone]

## 2019-09-18 ENCOUNTER — Other Ambulatory Visit: Payer: BC Managed Care – PPO

## 2019-09-18 ENCOUNTER — Other Ambulatory Visit: Payer: Self-pay

## 2019-09-18 DIAGNOSIS — I2510 Atherosclerotic heart disease of native coronary artery without angina pectoris: Secondary | ICD-10-CM | POA: Diagnosis not present

## 2019-09-18 DIAGNOSIS — E785 Hyperlipidemia, unspecified: Secondary | ICD-10-CM

## 2019-09-18 LAB — HEPATIC FUNCTION PANEL
ALT: 26 IU/L (ref 0–44)
AST: 28 IU/L (ref 0–40)
Albumin: 4.5 g/dL (ref 3.8–4.9)
Alkaline Phosphatase: 64 IU/L (ref 39–117)
Bilirubin Total: 0.2 mg/dL (ref 0.0–1.2)
Bilirubin, Direct: 0.1 mg/dL (ref 0.00–0.40)
Total Protein: 6.6 g/dL (ref 6.0–8.5)

## 2019-09-18 LAB — LIPID PANEL
Chol/HDL Ratio: 3.1 ratio (ref 0.0–5.0)
Cholesterol, Total: 130 mg/dL (ref 100–199)
HDL: 42 mg/dL (ref >39)
LDL Chol Calc (NIH): 51 mg/dL (ref 0–99)
Triglycerides: 233 mg/dL — ABNORMAL HIGH (ref 0–149)
VLDL Cholesterol Cal: 37 mg/dL (ref 5–40)

## 2019-09-19 ENCOUNTER — Telehealth: Payer: Self-pay

## 2019-09-19 NOTE — Telephone Encounter (Signed)
LMTCB

## 2019-09-19 NOTE — Telephone Encounter (Signed)
-----   Message from Belva Crome, MD sent at 09/19/2019  2:03 PM EDT ----- Let the patient know the TG is too high. If affordable, need to start VASCEPA 2 gm BID. This will give 25-30% reduction in events over time. A copy will be sent to Maryland Pink, MD

## 2019-09-27 ENCOUNTER — Other Ambulatory Visit: Payer: Self-pay | Admitting: *Deleted

## 2019-09-27 MED ORDER — VASCEPA 1 G PO CAPS: 2.0000 | ORAL_CAPSULE | Freq: Two times a day (BID) | ORAL | 3 refills | Status: DC

## 2019-09-27 MED ORDER — VASCEPA 1 G PO CAPS: 2.0000 | ORAL_CAPSULE | Freq: Two times a day (BID) | ORAL | 11 refills | Status: DC

## 2019-09-27 NOTE — Telephone Encounter (Signed)
Spoke with wife, DPR on file.  Went over results and recommendations.  Advised I will place samples of Vascepa at the front for pick up.  Advised to call back if prescription is unaffordable.  Wife verbalized understanding and was appreciative for call.

## 2019-09-28 ENCOUNTER — Telehealth: Payer: Self-pay

## 2019-09-28 NOTE — Telephone Encounter (Signed)
**Note De-Identified [place] Obfuscation** I started a Vascepa PA through covermymeds. Key: [id number]

## 2019-10-08 ENCOUNTER — Telehealth (INDEPENDENT_AMBULATORY_CARE_PROVIDER_SITE_OTHER): Payer: BC Managed Care – PPO | Admitting: Thoracic Surgery (Cardiothoracic Vascular Surgery)

## 2019-10-08 ENCOUNTER — Other Ambulatory Visit: Payer: Self-pay

## 2019-10-08 DIAGNOSIS — Z9889 Other specified postprocedural states: Secondary | ICD-10-CM

## 2019-10-08 NOTE — Progress Notes (Signed)
[address]       [place] [address]             [phone]     CARDIOTHORACIC SURGERY TELEPHONE VIRTUAL OFFICE NOTE  Primary Cardiologist is Calvin Grooms, MD PCP is Calvin Pink, MD   HPI:  I spoke with Calvin [name] (DOB August 28, 1966 ) via telephone on 10/08/2019 at 11:40 AM and verified that I was speaking with the correct person using more than one form of identification.  We discussed the reason(s) for conducting our visit virtually instead of in-person.  The patient expressed understanding the circumstances and agreed to proceed as described.   Patient is a 53 year old male with history of mitral valve prolapse and mitral regurgitation, coronary artery disease status post acute non-ST segment elevation myocardial infarction in 2015 treated with PCI and stenting of the diagonal branch,chronic sinusitis,and hyperlipidemia who underwent minimally invasive mitral valve repair on May 31, 2019.  His early postoperative recovery was uneventful and he was last seen here in our office on July 09, 2019.  I spoke with him on the telephone today to make sure that he continues to recover uneventfully.  He is quite pleased with his outcome.  Overall he states that he feels "much improved" in comparison with how he felt prior to surgery.  He does not exercise much on a regular basis but he states that he is back to normal activity without any limitations.  His breathing is better and he does not get short of breath with activity as much as he did prior to surgery.  He has not had any chest pain or chest tightness.  The soreness related to his surgical incision has almost completely resolved.  He stopped taking warfarin.  Overall he is pleased with his result and he has no questions.   Current Outpatient Medications  Medication Sig Dispense Refill  . albuterol (PROAIR HFA) 108 (90 BASE) MCG/ACT inhaler Inhale 2 puffs into the lungs every 6 (six) hours as needed for wheezing  or shortness of breath. 1 Inhaler prn  . amLODipine (NORVASC) 5 MG tablet TAKE 1 TABLET BY MOUTH DAILY.(PLEASE CALL AND SCHEDULE AN APPOINTMENT FOR FURTHER REFILLS) 90 tablet 2  . aspirin EC 81 MG tablet Take 1 tablet (81 mg total) by mouth daily. 90 tablet 3  . cetirizine (ZYRTEC) 10 MG tablet Take 10 mg by mouth daily as needed for allergies.     . chlorhexidine (PERIDEX) 0.12 % solution Rinse with 15 mls twice daily for 30 seconds. Use after breakfast and at bedtime. Spit out excess. Do not swallow. 480 mL prn  . cyclobenzaprine (FEXMID) 7.5 MG tablet Take 1 tablet (7.5 mg total) by mouth 3 (three) times daily as needed for muscle spasms. 30 tablet 0  . fenofibrate 160 MG tablet Take 160 mg by mouth daily.     . fluticasone (CUTIVATE) 0.05 % cream Apply 1 application topically every evening.     . fluticasone (FLONASE) 50 MCG/ACT nasal spray Place 1 spray into both nostrils every evening.     Calvin [name] Ethyl (VASCEPA) 1 g CAPS Take 2 capsules (2 g total) by mouth 2 (two) times daily. 180 capsule 3  . metoprolol tartrate (LOPRESSOR) 25 MG tablet Take 0.5 tablets (12.5 mg total) by mouth 2 (two) times daily. 90 tablet 3  . Multiple Vitamin (MULTIVITAMIN WITH MINERALS) TABS tablet Take 1 tablet by mouth daily.    . rosuvastatin (CRESTOR) 10 MG  tablet Take 1 tablet (10 mg total) by mouth daily. 90 tablet 3  . warfarin (COUMADIN) 5 MG tablet Take 1.5 tablets (7.5 mg total) by mouth daily. Or as directed by the Coumadin Clinic. 40 tablet 2   No current facility-administered medications for this visit.      Diagnostic Tests:  n/a   Impression:  Patient is doing well approximately 9-month status post minimally invasive mitral valve repair  Plan:  Patient will continue to follow-up intermittently with Dr. Katrinka [name].  He will return to our office for routine follow-up next June, approximately 1 year following his surgery.  All of his questions have been addressed.    I discussed limitations  of evaluation and management via telephone.  The patient was advised to call back for repeat telephone consultation or to seek an in-person evaluation if questions arise or the patient's clinical condition changes in any significant manner.  I spent in excess of 5 minutes of non-face-to-face time during the conduct of this telephone virtual office consultation.     Calvin Decent. Cornelius Moras, MD 10/08/2019 11:40 AM

## 2019-10-18 ENCOUNTER — Telehealth: Payer: Self-pay | Admitting: Interventional Cardiology

## 2019-10-18 NOTE — Telephone Encounter (Signed)
Called pt about appt scheduled 10/26.  Dr. Tamala Julian will not be in the office this day, therefore, appt needs to be changed to VIDEO (uses Doximity) or PHONE visit.    Left message to call back.

## 2019-10-19 ENCOUNTER — Telehealth: Payer: Self-pay

## 2019-10-19 NOTE — Telephone Encounter (Signed)
Left message on home phone to call back first thing Monday morning to change appt.  Attempted wife again, no answer.

## 2019-10-19 NOTE — Telephone Encounter (Signed)
CMA attempted to contact pt earlier about switching appt.  I just tried to call wife to see about flipping appt type as pt works 3rd shift.  VM has not been set up. Unable to leave message.

## 2019-10-19 NOTE — Telephone Encounter (Signed)
LMTCB SO WE CAN CHANGE APPOINTMENT WITH DR Tamala Julian TO VIRTUAL FOR MONDAY (10/22/2019) ALSO NEED TO REVIEW MEDICATIONS.

## 2019-10-22 ENCOUNTER — Encounter: Payer: Self-pay | Admitting: Interventional Cardiology

## 2019-10-22 ENCOUNTER — Other Ambulatory Visit: Payer: Self-pay

## 2019-10-22 ENCOUNTER — Telehealth (INDEPENDENT_AMBULATORY_CARE_PROVIDER_SITE_OTHER): Payer: BC Managed Care – PPO | Admitting: Interventional Cardiology

## 2019-10-22 VITALS — BP 130/85 | Ht 73.0 in | Wt 203.0 lb

## 2019-10-22 DIAGNOSIS — I2510 Atherosclerotic heart disease of native coronary artery without angina pectoris: Secondary | ICD-10-CM

## 2019-10-22 DIAGNOSIS — Z9889 Other specified postprocedural states: Secondary | ICD-10-CM

## 2019-10-22 DIAGNOSIS — I4891 Unspecified atrial fibrillation: Secondary | ICD-10-CM

## 2019-10-22 DIAGNOSIS — I10 Essential (primary) hypertension: Secondary | ICD-10-CM

## 2019-10-22 DIAGNOSIS — Z7901 Long term (current) use of anticoagulants: Secondary | ICD-10-CM

## 2019-10-22 DIAGNOSIS — E785 Hyperlipidemia, unspecified: Secondary | ICD-10-CM

## 2019-10-22 DIAGNOSIS — Z7189 Other specified counseling: Secondary | ICD-10-CM

## 2019-10-22 NOTE — Patient Instructions (Signed)
Medication Instructions:  Your physician recommends that you continue on your current medications as directed. Please refer to the Current Medication list given to you today.  *If you need a refill on your cardiac medications before your next appointment, please call your pharmacy*  Lab Work: None If you have labs (blood work) drawn today and your tests are completely normal, you will receive your results only by: Marland Kitchen MyChart Message (if you have MyChart) OR . A paper copy in the mail If you have any lab test that is abnormal or we need to change your treatment, we will call you to review the results.  Testing/Procedures: None  Follow-Up: At [facility], you and your health needs are our priority.  As part of our continuing mission to provide you with exceptional heart care, we have created designated Provider Care Teams.  These Care Teams include your primary Cardiologist (physician) and Advanced Practice Providers (APPs -  Physician Assistants and Nurse Practitioners) who all work together to provide you with the care you need, when you need it.  Your next appointment:   10 months  The format for your next appointment:   In Person  Provider:   You may see Sinclair Grooms, MD or one of the following Advanced Practice Providers on your designated Care Team:    Truitt Merle, NP  Cecilie Kicks, NP  Kathyrn Drown, NP   Other Instructions

## 2019-10-22 NOTE — Telephone Encounter (Signed)
Pt called this morning and changed appt to telephone.     Virtual Visit Pre-Appointment Phone Call  "(Name), I am calling you today to discuss your upcoming appointment. We are currently trying to limit exposure to the virus that causes COVID-19 by seeing patients at home rather than in the office."  1. "What is the BEST phone number to call the day of the visit?" - include this in appointment notes  2. "Do you have or have access to (through a family member/friend) a smartphone with video capability that we can use for your visit?" a. If yes - list this number in appt notes as "cell" (if different from BEST phone #) and list the appointment type as a VIDEO visit in appointment notes b. If no - list the appointment type as a PHONE visit in appointment notes  3. Confirm consent - "In the setting of the current Covid19 crisis, you are scheduled for a (phone or video) visit with your provider on (date) at (time).  Just as we do with many in-office visits, in order for you to participate in this visit, we must obtain consent.  If you'd like, I can send this to your mychart (if signed up) or email for you to review.  Otherwise, I can obtain your verbal consent now.  All virtual visits are billed to your insurance company just like a normal visit would be.  By agreeing to a virtual visit, we'd like you to understand that the technology does not allow for your provider to perform an examination, and thus may limit your provider's ability to fully assess your condition. If your provider identifies any concerns that need to be evaluated in person, we will make arrangements to do so.  Finally, though the technology is pretty good, we cannot assure that it will always work on either your or our end, and in the setting of a video visit, we may have to convert it to a phone-only visit.  In either situation, we cannot ensure that we have a secure connection.  Are you willing to proceed?" STAFF: Did the patient  verbally acknowledge consent to telehealth visit? Document YES/NO here: YES  4. Advise patient to be prepared - "Two hours prior to your appointment, go ahead and check your blood pressure, pulse, oxygen saturation, and your weight (if you have the equipment to check those) and write them all down. When your visit starts, your provider will ask you for this information. If you have an Apple Watch or Kardia device, please plan to have heart rate information ready on the day of your appointment. Please have a pen and paper handy nearby the day of the visit as well."  5. Give patient instructions for MyChart download to smartphone OR Doximity/Doxy.me as below if video visit (depending on what platform provider is using)  6. Inform patient they will receive a phone call 15 minutes prior to their appointment time (may be from unknown caller ID) so they should be prepared to answer    TELEPHONE CALL NOTE  Calvin [name] has been deemed a candidate for a follow-up tele-health visit to limit community exposure during the Covid-19 pandemic. I spoke with the patient via phone to ensure availability of phone/video source, confirm preferred email & phone number, and discuss instructions and expectations.  I reminded Orvin Netter to be prepared with any vital sign and/or heart rhythm information that could potentially be obtained via home monitoring, at the time of his visit. I reminded Trevon  Mathieu to expect a phone call prior to his visit.  Loren Racer, RN 10/22/2019 8:46 AM   INSTRUCTIONS FOR DOWNLOADING THE MYCHART APP TO SMARTPHONE  - The patient must first make sure to have activated MyChart and know their login information - If Apple, go to [employer] and type in MyChart in the search bar and download the app. If Android, ask patient to go to [employer] and type in [place] in the search bar and download the app. The app is free but as with any other app downloads, their phone may require  them to verify saved payment information or Apple/Android password.  - The patient will need to then log into the app with their MyChart username and password, and select [facility] as their healthcare provider to link the account. When it is time for your visit, go to the MyChart app, find appointments, and click Begin Video Visit. Be sure to Select Allow for your device to access the Microphone and Camera for your visit. You will then be connected, and your provider will be with you shortly.  **If they have any issues connecting, or need assistance please contact MyChart service desk [phone])**  **If using a computer, in order to ensure the best quality for their visit they will need to use either of the following Internet Browsers: [employer], or Google Chrome**  IF USING DOXIMITY or DOXY.ME - The patient will receive a link just prior to their visit by text.     FULL LENGTH CONSENT FOR TELE-HEALTH VISIT   I hereby voluntarily request, consent and authorize [facility] and its employed or contracted physicians, physician assistants, nurse practitioners or other licensed health care professionals (the Practitioner), to provide me with telemedicine health care services (the "Services") as deemed necessary by the treating Practitioner. I acknowledge and consent to receive the Services by the Practitioner via telemedicine. I understand that the telemedicine visit will involve communicating with the Practitioner through live audiovisual communication technology and the disclosure of certain medical information by electronic transmission. I acknowledge that I have been given the opportunity to request an in-person assessment or other available alternative prior to the telemedicine visit and am voluntarily participating in the telemedicine visit.  I understand that I have the right to withhold or withdraw my consent to the use of telemedicine in the course of my care at any  time, without affecting my right to future care or treatment, and that the Practitioner or I may terminate the telemedicine visit at any time. I understand that I have the right to inspect all information obtained and/or recorded in the course of the telemedicine visit and may receive copies of available information for a reasonable fee.  I understand that some of the potential risks of receiving the Services via telemedicine include:  Marland Kitchen Delay or interruption in medical evaluation due to technological equipment failure or disruption; . Information transmitted may not be sufficient (e.g. poor resolution of images) to allow for appropriate medical decision making by the Practitioner; and/or  . In rare instances, security protocols could fail, causing a breach of personal health information.  Furthermore, I acknowledge that it is my responsibility to provide information about my medical history, conditions and care that is complete and accurate to the best of my ability. I acknowledge that Practitioner's advice, recommendations, and/or decision may be based on factors not within their control, such as incomplete or inaccurate data provided by me or distortions of diagnostic images  or specimens that may result from electronic transmissions. I understand that the practice of medicine is not an exact science and that Practitioner makes no warranties or guarantees regarding treatment outcomes. I acknowledge that I will receive a copy of this consent concurrently upon execution via email to the email address I last provided but may also request a printed copy by calling the office of [facility] [facility].    I understand that my insurance will be billed for this visit.   I have read or had this consent read to me. . I understand the contents of this consent, which adequately explains the benefits and risks of the Services being provided via telemedicine.  . I have been provided ample opportunity to ask questions  regarding this consent and the Services and have had my questions answered to my satisfaction. . I give my informed consent for the services to be provided through the use of telemedicine in my medical care  By participating in this telemedicine visit I agree to the above.

## 2019-10-22 NOTE — Progress Notes (Signed)
Virtual Visit via Telephone Note   This visit type was conducted due to national recommendations for restrictions regarding the COVID-19 Pandemic (e.g. social distancing) in an effort to limit this patient's exposure and mitigate transmission in our community.  Due to his co-morbid illnesses, this patient is at least at moderate risk for complications without adequate follow up.  This format is felt to be most appropriate for this patient at this time.  The patient did not have access to video technology/had technical difficulties with video requiring transitioning to audio format only (telephone).  All issues noted in this document were discussed and addressed.  No physical exam could be performed with this format.  Please refer to the patient's chart for his  consent to [facility].   Date:  10/22/2019   ID:  Calvin [name], DOB 05/24/1966, MRN [id number]  Patient Location: Home Provider Location: Home  PCP:  Jerl MinaHedrick, James, MD  Cardiologist:  Lesleigh NoeHenry W Ercel Normoyle III, MD  Electrophysiologist:  None   Evaluation Performed:  Follow-Up Visit  Chief Complaint:  CAD/ MVR  History of Present Illness:    Calvin Organony Carlucci is a 53 y.o. male with a hx of history of coronary artery disease status postDESLAD and diagonal3/2015, HLD, severe mitral valve regurgitation due to prolapse status post minimally invasive mitral valve repair byDr. Owen6/03/2019. Preop cath showed patent stents in the LAD & diagonal, 70-80% ostial Cfx, 50-60% mid Cfx, 40% prox-med RCA, Readmitted 6/15 with right pleural effusion.  He is doing well.  He denies angina, orthopnea, and exertional dyspnea.  No palpitations or medication side effects.  Is now on Dulera because of wheezing.  The patient does not have symptoms concerning for COVID-19 infection (fever, chills, cough, or new shortness of breath).    Past Medical History:  Diagnosis Date  . ACS (acute coronary syndrome) with NSTEMI 03/25/2014  . Acute  sinus infection 03/27/2014  . Asthma   . Asthmatic bronchitis   . CAD (coronary artery disease), native coronary artery, DES placed to 1st diag. 03/26/14; residual disease to LAD with FFR to 0.62 and RCA of 50-60%- plan for PCI to LAD 03/27/2014  . Coronary artery disease   . Dyslipidemia, goal LDL below 70 03/27/2014  . GI bleed 03/27/2014  . Hyperlipemia   . Hypertension    Norvasc  . MVP (mitral valve prolapse)    Dr Lady GaryFath  . Non Q wave myocardial infarction (HCC) 03/23/2014  . Psoriasis   . Rhinitis   . S/P minimally invasive mitral valve repair 05/31/2019   Complex valvuloplasty including quadrangular resection of posterior leaflet, sliding leaflet annuloplasty, autologous pericardial patch augmentation of P1 segment of posterior leaflet and 34 mm Sorin Memo 4D ring annuloplasty via right mini thoracotomy approach   Past Surgical History:  Procedure Laterality Date  . CORONARY ANGIOPLASTY WITH STENT PLACEMENT  03/25/2014; 04/16/2014   "1 + 1"  . FLEXIBLE SIGMOIDOSCOPY Left 03/28/2014   Procedure: FLEXIBLE SIGMOIDOSCOPY;  Surgeon: Theda BelfastPatrick D Hung, MD;  [facility];  Service: Endoscopy;  Laterality: Left;  . IR THORACENTESIS ASP PLEURAL SPACE W/IMG GUIDE  06/12/2019  . LEFT HEART CATHETERIZATION WITH CORONARY ANGIOGRAM N/A 03/26/2014   Procedure: LEFT HEART CATHETERIZATION WITH CORONARY ANGIOGRAM;  Surgeon: Lesleigh NoeHenry W Elyzabeth Goatley III, MD;  [facility];  Service: Cardiovascular;  Laterality: N/A;  . MITRAL VALVE REPAIR Right 05/31/2019   Procedure: MINIMALLY INVASIVE MITRAL VALVE REPAIR (MVR) Using Memo 4D Ring Size 34MM;  Surgeon: Purcell Nailswen, Clarence H, MD;  Location: [facility] OR;  Service: Open Heart Surgery;  Laterality: Right;  . nasal polyps    . PERCUTANEOUS CORONARY STENT INTERVENTION (PCI-S) N/A 04/16/2014   Procedure: PERCUTANEOUS CORONARY STENT INTERVENTION (PCI-S);  Surgeon: [name], MD;  [facility] C[facility] LAB;  Service: Cardiovascular;  Laterality: N/A;  . RIGHT/LEFT HEART CATH AND  CORONARY ANGIOGRAPHY N/A 03/14/2019   Procedure: RIGHT/LEFT HEART CATH AND CORONARY ANGIOGRAPHY;  Surgeon: [name], MD;  Location: [facility] INVASIVE CV LAB;  Service: Cardiovascular;  Laterality: N/A;  . TEE WITHOUT CARDIOVERSION N/A 03/05/2019   Procedure: TRANSESOPHAGEAL ECHOCARDIOGRAM (TEE);  Surgeon: [name], MD;  Location: [facility] ENDOSCOPY;  Service: Cardiovascular;  Laterality: N/A;  . TEE WITHOUT CARDIOVERSION N/A 05/31/2019   Procedure: TRANSESOPHAGEAL ECHOCARDIOGRAM (TEE);  Surgeon: Purcell Nails, MD;  Location: [facility] OR;  Service: Open Heart Surgery;  Laterality: N/A;  . WISDOM TOOTH EXTRACTION       Current Meds  Medication Sig  . albuterol (PROAIR HFA) 108 (90 BASE) MCG/ACT inhaler Inhale 2 puffs into the lungs every 6 (six) hours as needed for wheezing or shortness of breath.  Marland Kitchen amLODipine (NORVASC) 5 MG tablet TAKE 1 TABLET BY MOUTH DAILY.(PLEASE CALL AND SCHEDULE AN APPOINTMENT FOR FURTHER REFILLS)  . aspirin EC 81 MG tablet Take 1 tablet (81 mg total) by mouth daily.  . cetirizine (ZYRTEC) 10 MG tablet Take 10 mg by mouth daily as needed for allergies.   . fenofibrate 160 MG tablet Take 160 mg by mouth daily.   . fluticasone (CUTIVATE) 0.05 % cream Apply 1 application topically every evening.   . fluticasone (FLONASE) 50 MCG/ACT nasal spray Place 1 spray into both nostrils every evening.   Bess Harvest Ethyl (VASCEPA) 1 g CAPS Take 2 capsules (2 g total) by mouth 2 (two) times daily.  . metoprolol tartrate (LOPRESSOR) 25 MG tablet Take 0.5 tablets (12.5 mg total) by mouth 2 (two) times daily.  . Multiple Vitamin (MULTIVITAMIN WITH MINERALS) TABS tablet Take 1 tablet by mouth daily.  . rosuvastatin (CRESTOR) 10 MG tablet Take 1 tablet (10 mg total) by mouth daily.     Allergies:   Brilinta [ticagrelor] and Daliresp [roflumilast]   Social History   Tobacco Use  . Smoking status: Never Smoker  . Smokeless tobacco: Never Used  Substance Use Topics  . Alcohol use: No  .  Drug use: No     Family Hx: The patient's family history includes Asthma in his maternal grandmother; Emphysema in his maternal grandmother; Heart disease in his maternal uncle and mother; Hypertension in his maternal grandmother; Prostate cancer in his paternal grandfather; Stroke in his maternal uncle. There is no history of Heart attack.  ROS:   Please see the history of present illness.    He has reactive airways disease.  He was hoping that this would improve with mitral valve surgery but it has not.  Currently on Dulera. All other systems reviewed and are negative.   Prior CV studies:   The following studies were reviewed today:  No new data  Labs/Other Tests and Data Reviewed:    EKG:  No ECG reviewed.  Recent Labs: 02/08/2019: NT-Pro BNP 37 06/01/2019: Magnesium 2.2 08/15/2019: Brain Natriuretic Peptide 120; BUN 15; Creat 1.08; Hemoglobin 13.6; Platelets 457; Potassium 4.7; Sodium 140 09/18/2019: ALT 26   Recent Lipid Panel Lab Results  Component Value Date/Time   CHOL 130 09/18/2019 08:56 AM   TRIG 233 (H) 09/18/2019 08:56 AM   HDL 42 09/18/2019  08:56 AM   CHOLHDL 3.1 09/18/2019 08:56 AM   CHOLHDL 2 09/24/2015 07:50 AM   LDLCALC 51 09/18/2019 08:56 AM    Wt Readings from Last 3 Encounters:  10/22/19 203 lb (92.1 kg)  07/17/19 212 lb (96.2 kg)  07/09/19 203 lb (92.1 kg)     Objective:    Vital Signs:  BP 130/85   Ht 6\' 1"  (1.854 m)   Wt 203 lb (92.1 kg)   BMI 26.78 kg/m    VITAL SIGNS:  reviewed  ASSESSMENT & PLAN:    1. S/P minimally invasive mitral valve repair   2. Long term (current) use of anticoagulants   3. Atrial fibrillation, unspecified type ([place])   4. Coronary artery disease involving native coronary artery of native heart without angina pectoris   5. Dyslipidemia, goal LDL below 70   6. Essential hypertension   7. Educated about COVID-19 virus infection    PLAN:  1. Doing well post surgery. 2. Long-term anticoagulation therapy has been  discontinued.  Now only on an aspirin per day. 3. Had postoperative atrial fibrillation but no recurrence. 4. Stable without angina.  Secondary prevention discussed. 5. Target LDL less than 70. 6. Less than 130/80 mmHg is encouraged. 7. Social distancing, mask wearing, and handwashing is encouraged and endorsed by the patient.  Overall education and awareness concerning primary/secondary risk prevention was discussed in detail: LDL less than 70, hemoglobin A1c less than 7, blood pressure target less than 130/80 mmHg, >150 minutes of moderate aerobic activity per week, avoidance of smoking, weight control (via diet and exercise), and continued surveillance/management of/for obstructive sleep apnea.   COVID-19 Education: The signs and symptoms of COVID-19 were discussed with the patient and how to seek care for testing (follow up with PCP or arrange E-visit).  The importance of social distancing was discussed today.  Time:   Today, I have spent 15 minutes with the patient with telehealth technology discussing the above problems.     Medication Adjustments/Labs and Tests Ordered: Current medicines are reviewed at length with the patient today.  Concerns regarding medicines are outlined above.   Tests Ordered: No orders of the defined types were placed in this encounter.   Medication Changes: No orders of the defined types were placed in this encounter.   Follow Up:  In Person in 10 month(s)  Signed, Sinclair Grooms, MD  10/22/2019 9:17 AM    [place]

## 2019-10-23 DIAGNOSIS — Z13220 Encounter for screening for lipoid disorders: Secondary | ICD-10-CM | POA: Diagnosis not present

## 2019-10-23 DIAGNOSIS — Z0000 Encounter for general adult medical examination without abnormal findings: Secondary | ICD-10-CM | POA: Diagnosis not present

## 2019-10-23 DIAGNOSIS — Z125 Encounter for screening for malignant neoplasm of prostate: Secondary | ICD-10-CM | POA: Diagnosis not present

## 2019-10-23 DIAGNOSIS — Z1389 Encounter for screening for other disorder: Secondary | ICD-10-CM | POA: Diagnosis not present

## 2019-10-24 DIAGNOSIS — Z0000 Encounter for general adult medical examination without abnormal findings: Secondary | ICD-10-CM | POA: Diagnosis not present

## 2019-10-24 DIAGNOSIS — E785 Hyperlipidemia, unspecified: Secondary | ICD-10-CM | POA: Diagnosis not present

## 2019-10-24 DIAGNOSIS — I2510 Atherosclerotic heart disease of native coronary artery without angina pectoris: Secondary | ICD-10-CM | POA: Diagnosis not present

## 2019-10-24 DIAGNOSIS — J4540 Moderate persistent asthma, uncomplicated: Secondary | ICD-10-CM | POA: Diagnosis not present

## 2019-10-25 DIAGNOSIS — Z23 Encounter for immunization: Secondary | ICD-10-CM | POA: Diagnosis not present

## 2020-02-10 IMAGING — CT CT ANGIO CHEST-ABD-PELV FOR DISSECTION W/ AND WO/W CM
2 of 7 series · 12 of 46 positions shown, 14 images · IV contrast (omnipaque)
Comparison: CT dated 05/22/2019.

CLINICAL DATA: Severe midline chest pain status post mitral valve
replacement on 05/31/2019.

EXAM:
CT ANGIOGRAPHY CHEST, ABDOMEN AND PELVIS
TECHNIQUE: Multidetector CT imaging through the chest, abdomen and pelvis was
performed using the standard protocol during bolus administration of
intravenous contrast. Multiplanar reconstructed images and MIPs were
obtained and reviewed to evaluate the vascular anatomy.
CONTRAST:  100mL OMNIPAQUE IOHEXOL 350 MG/ML SOLN

[Series 7: dissection 2mm · axial · 0.83mm/px · z∈[+258,+842]mm · 9 of 348 slices shown, 11 images]
[im 37/348  soft-tissue]
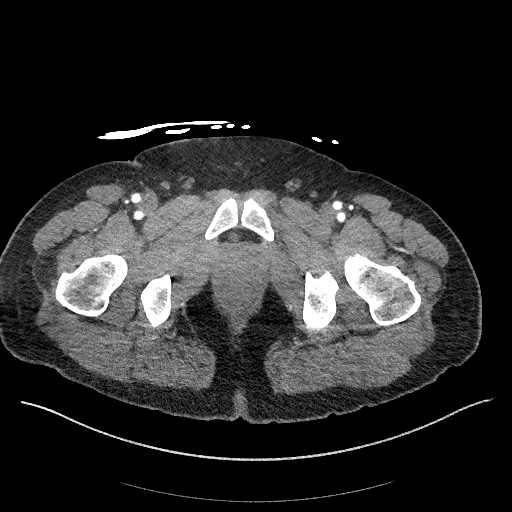
[im 37/348  bone]
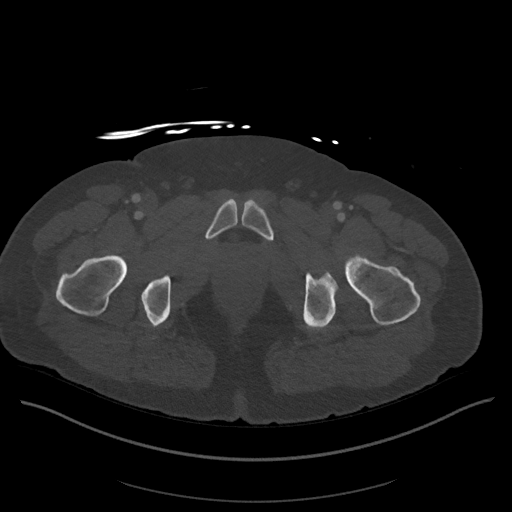
[im 74/348  soft-tissue]
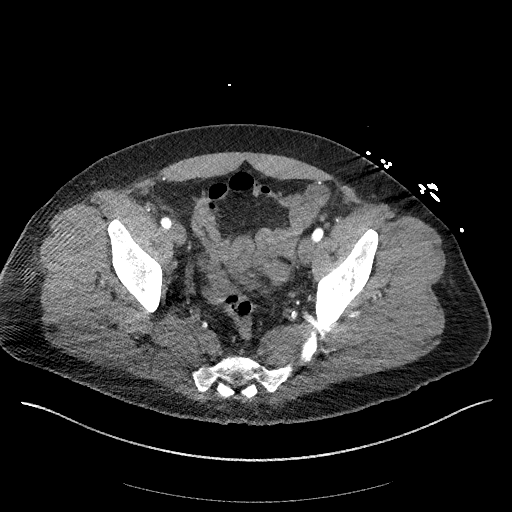
[im 110/348  soft-tissue]
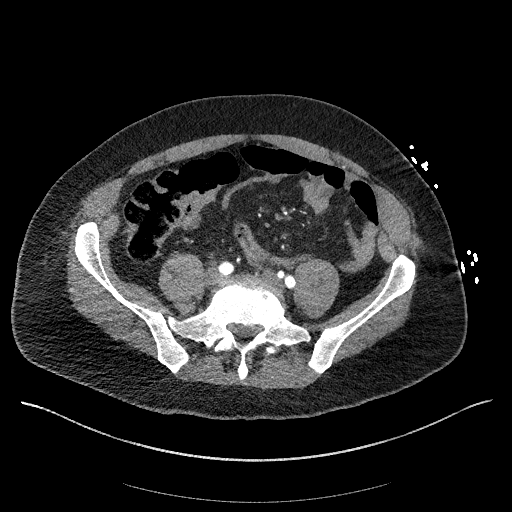
[im 147/348  soft-tissue]
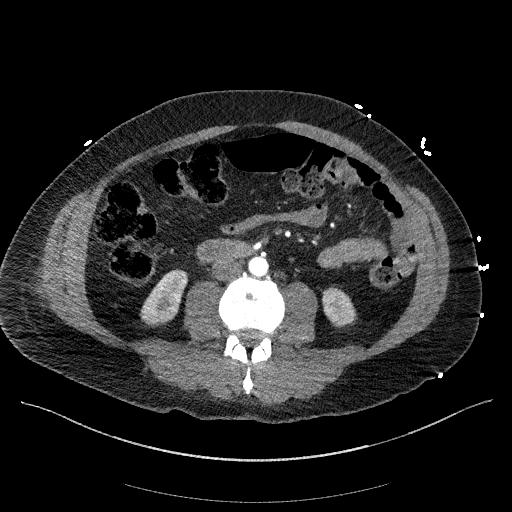
[im 183/348  soft-tissue]
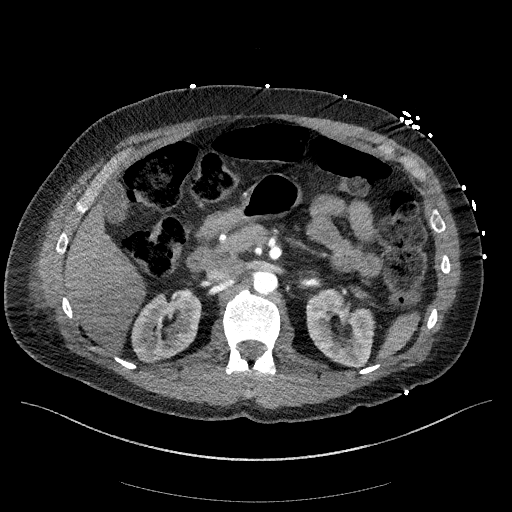
[im 220/348  soft-tissue]
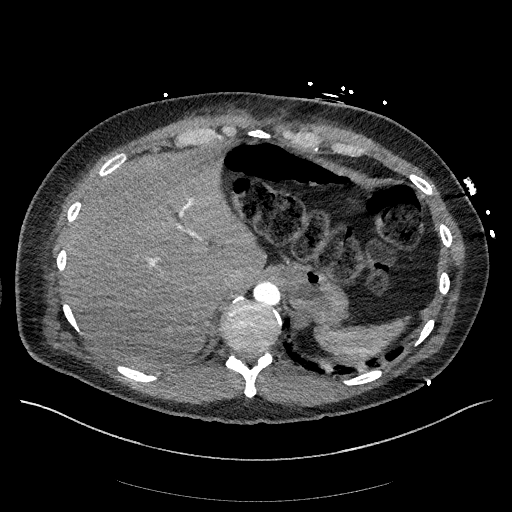
[im 256/348  soft-tissue]
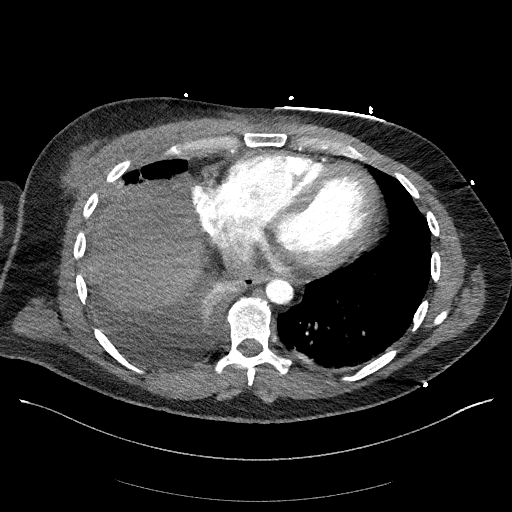
[im 293/348  soft-tissue]
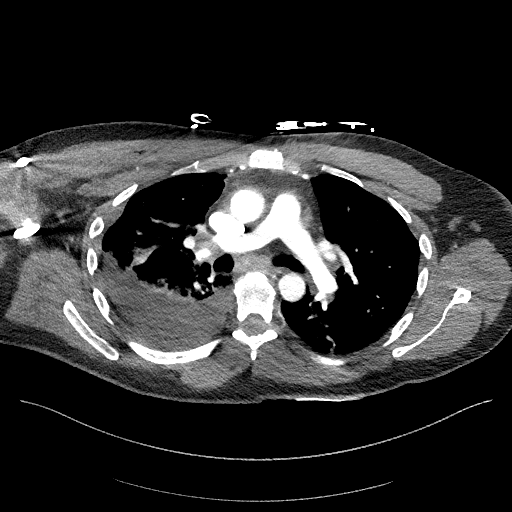
[im 329/348  soft-tissue]
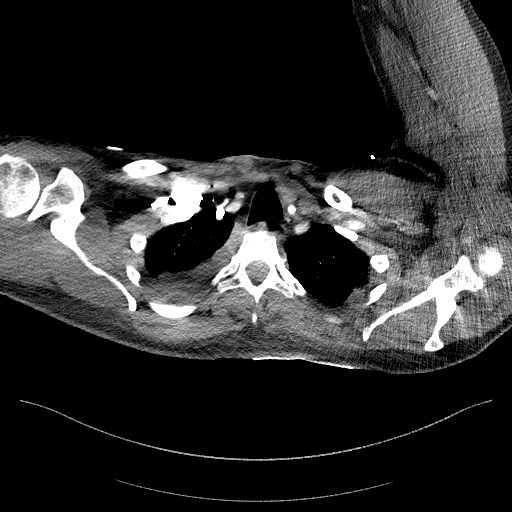
[im 329/348  bone]
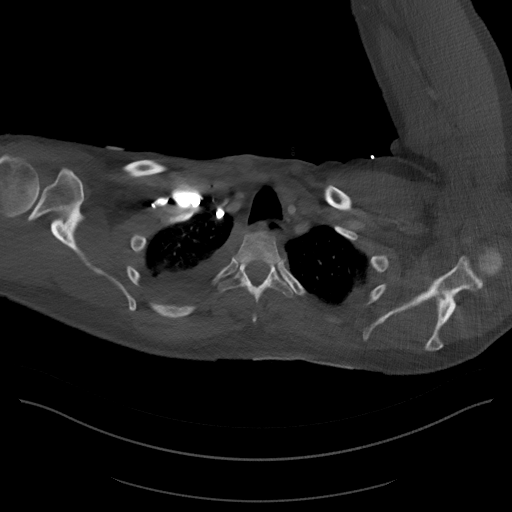

[Series 9: dissection 2mm cor · coronal · 0.83mm/px · 3 of 180 slices shown]
[im 45/180  soft-tissue]
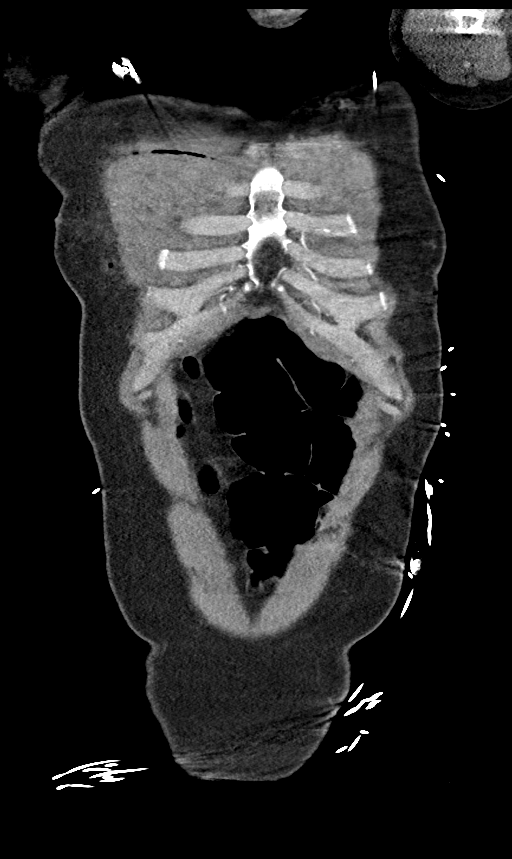
[im 90/180  soft-tissue]
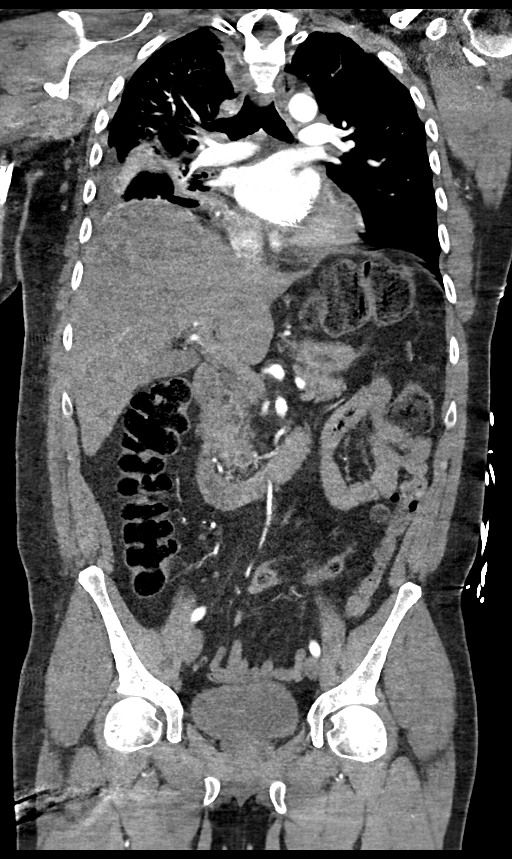
[im 135/180  soft-tissue]
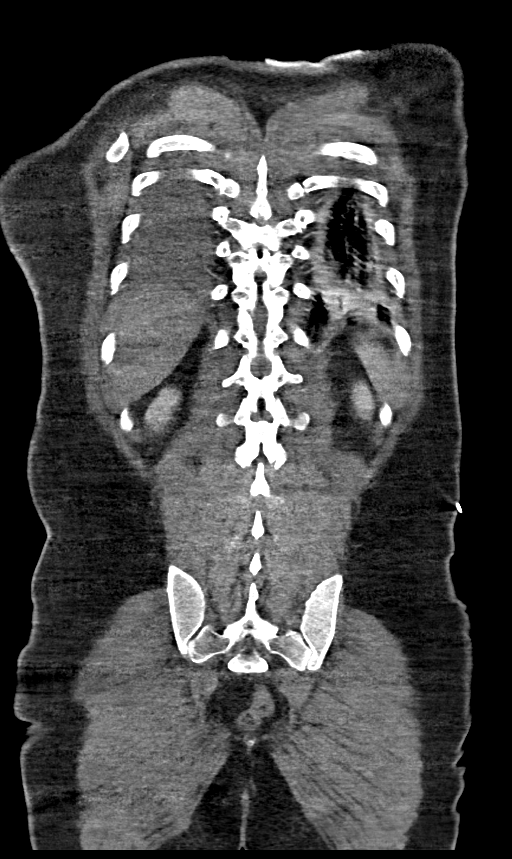

[12 of 46 positions shown; findings below may reference images not displayed]

FINDINGS: CTA CHEST FINDINGS

Cardiovascular: The patient is status post mitral valve replacement.
Coronary artery calcifications and stents are noted. There is no
significant pericardial effusion. The heart size is not
significantly enlarged. There are mild atherosclerotic changes of
the thoracic aorta.

Mediastinum/Nodes: No enlarged mediastinal, hilar, or axillary lymph
nodes. There is a mildly enlarged right hilar lymph node which is
likely reactive. Thyroid gland, trachea, and esophagus demonstrate
no significant findings.

Lungs/Pleura: There is a possible trace right-sided pneumothorax,
however beam hardening artifact have a similar appearance from the
first rib. No evidence of a left-sided pneumothorax. There is a
moderate-sized right-sided pleural effusion. There is a trace
left-sided pleural effusion. There is atelectasis at the left lung
base. There is atelectasis involving much of the right lower lobe.
There is a branching opacity in the left upper lobe, stable from
prior CT. This is favored to represent inspissated mucous

Musculoskeletal: There is gas within the right pectoralis muscle.
There is some fat stranding and asymmetric enlargement of the right
pectoralis muscle. There is a small 4 by 1.4 cm collection anterior
to the right pectoralis muscle favored to represent a small
postoperative seroma or hematoma. There are pockets of subcutaneous
gas along the patient's right flank.

Review of the MIP images confirms the above findings.

CTA ABDOMEN AND PELVIS FINDINGS

VASCULAR

Aorta: Normal caliber aorta without aneurysm, dissection, vasculitis
or significant stenosis.

Celiac: There is severe stenosis at the origin of the celiac axis
with mild poststenotic dilatation with the celiac trunk measuring
approximately 1 cm in diameter.

SMA: Patent without evidence of aneurysm, dissection, vasculitis or
significant stenosis.

Renals: Both renal arteries are patent without evidence of aneurysm,
dissection, vasculitis, fibromuscular dysplasia or significant
stenosis. There are 2 right renal arteries.

IMA: Patent without evidence of aneurysm, dissection, vasculitis or
significant stenosis.

Inflow: Patent without evidence of aneurysm, dissection, vasculitis
or significant stenosis.

Veins: No obvious venous abnormality within the limitations of this
arterial phase study.

Review of the MIP images confirms the above findings.

NON-VASCULAR

Hepatobiliary: No focal liver abnormality is seen. No gallstones,
gallbladder wall thickening, or biliary dilatation.

Pancreas: Unremarkable. No pancreatic ductal dilatation or
surrounding inflammatory changes.

Spleen: Normal in size without focal abnormality.

Adrenals/Urinary Tract: There is air within the urinary bladder.
There is no hydronephrosis. No radiopaque obstructing kidney stones.
The adrenal glands are unremarkable.

Stomach/Bowel: Stomach is within normal limits. Appendix appears
normal. No evidence of bowel wall thickening, distention, or
inflammatory changes.

Lymphatic: No significant vascular findings are present. No enlarged
abdominal or pelvic lymph nodes.

Reproductive: Uterus and bilateral adnexa are unremarkable.

Other: There is some fat stranding in the right inguinal region
which is likely related to recent intervention.

Musculoskeletal: No acute or significant osseous findings.

Review of the MIP images confirms the above findings.
IMPRESSION: 1. No dissection.  No pulmonary embolus.
2. Possible trace right-sided pneumothorax.
3. Moderate right-sided pleural effusion.
4. Bibasilar atelectasis, right worse than left.
5. Subcutaneous gas along the right pectoralis muscle with
associated adjacent fat stranding and mild asymmetric enlargement of
the right pectoralis major muscle, is likely all postsurgical and
related to the patient's recent mitral valve replacement.
6. Severe stenosis of the celiac axis with poststenotic dilatation.
7. There are few foci of gas within the urinary bladder. Correlation
for recent instrumentation is recommended.

## 2020-02-11 IMAGING — CR CHEST  1 VIEW
1 series · 1 of 1 positions shown · non-contrast
Comparison: 06/11/2019.

CLINICAL DATA: Post right thoracentesis

EXAM:
CHEST  1 VIEW

[chest ap]
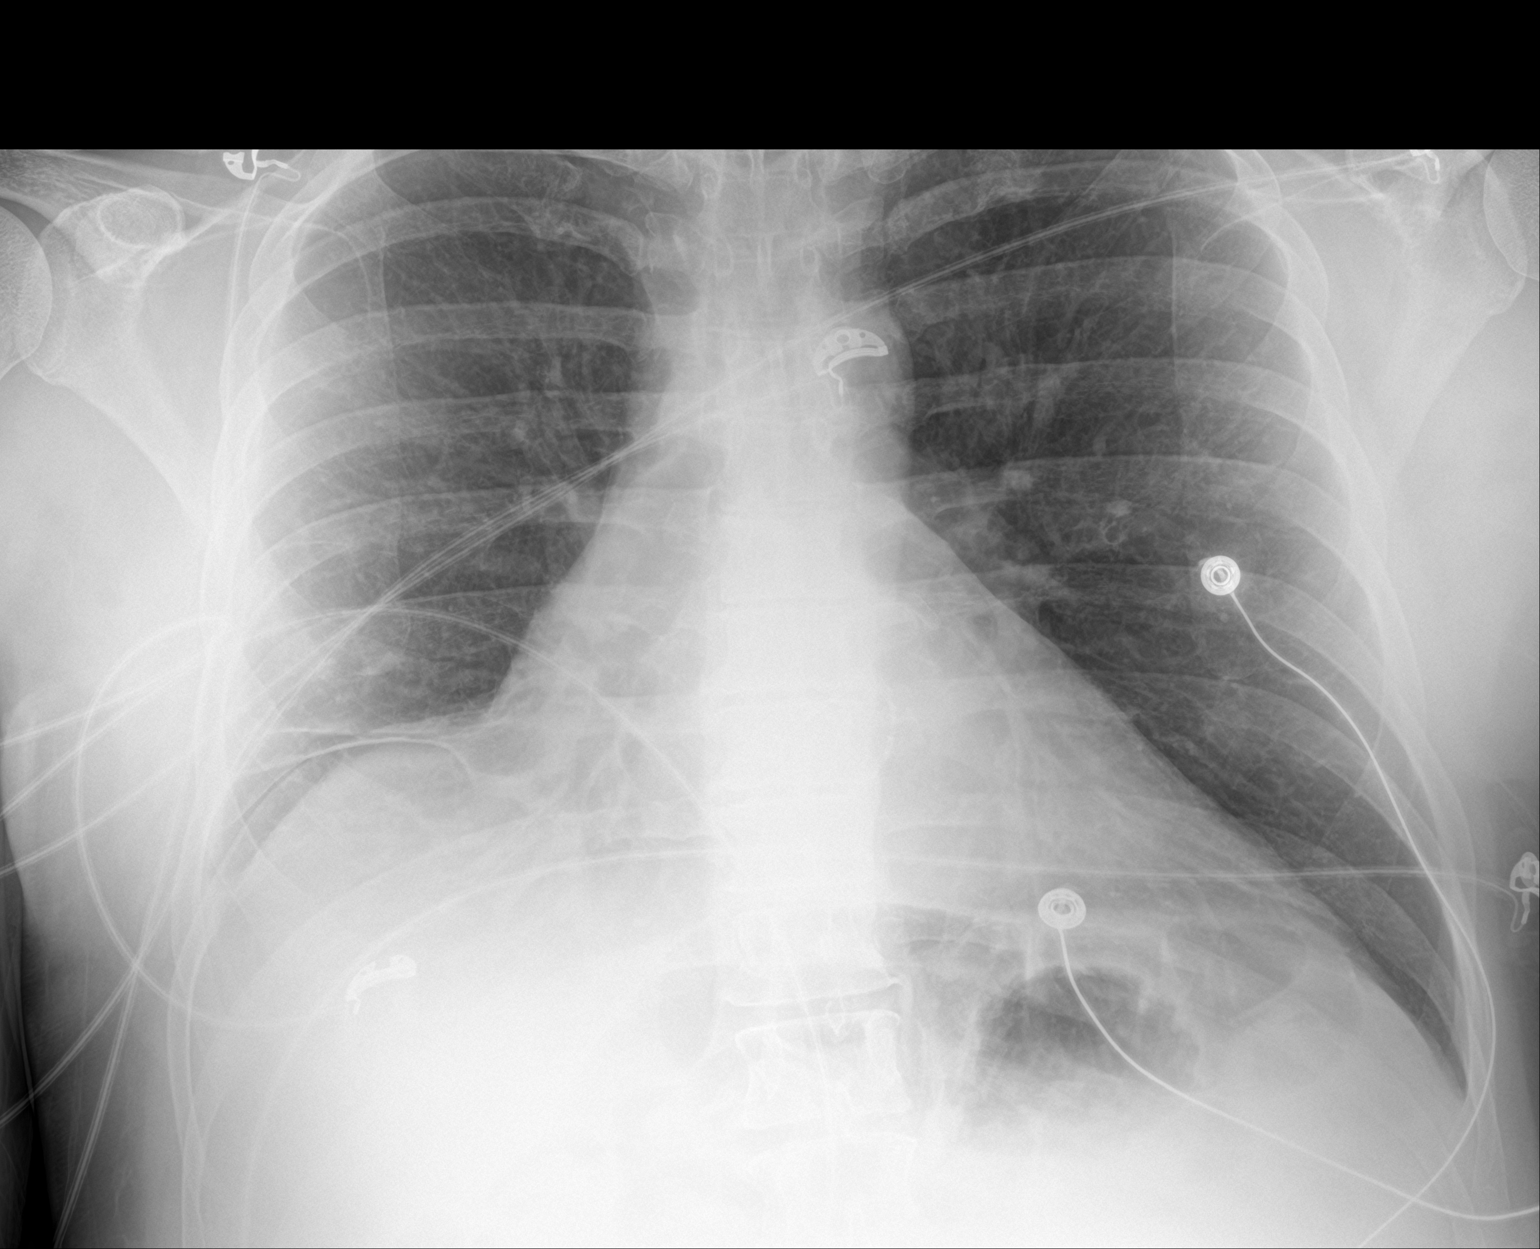

[1 of 1 positions shown; findings below may reference images not displayed]

FINDINGS: Decreased right pleural effusion post thoracentesis. No
pneumothorax. Mild right lower lobe atelectasis. Left lung clear.
Negative for heart failure.
IMPRESSION: No complication post right thoracentesis. Decreased right pleural
effusion.

## 2020-02-11 IMAGING — US IR THORACENTESIS ASP PLEURAL SPACE W/IMG GUIDE
1 series · 1 of 1 positions shown · non-contrast
Comparison: none

INDICATION: Patient with history of minimally invasive mitral valve repair in OR
05/31/2019 by Dr. Tumulak, dyspnea, and right-sided pleural effusion.
Request is made for diagnostic and therapeutic right thoracentesis

[Series 1: ir (id) (id)/(id)/(id) ir · 1 of 1 slices shown]
[im 1/1]
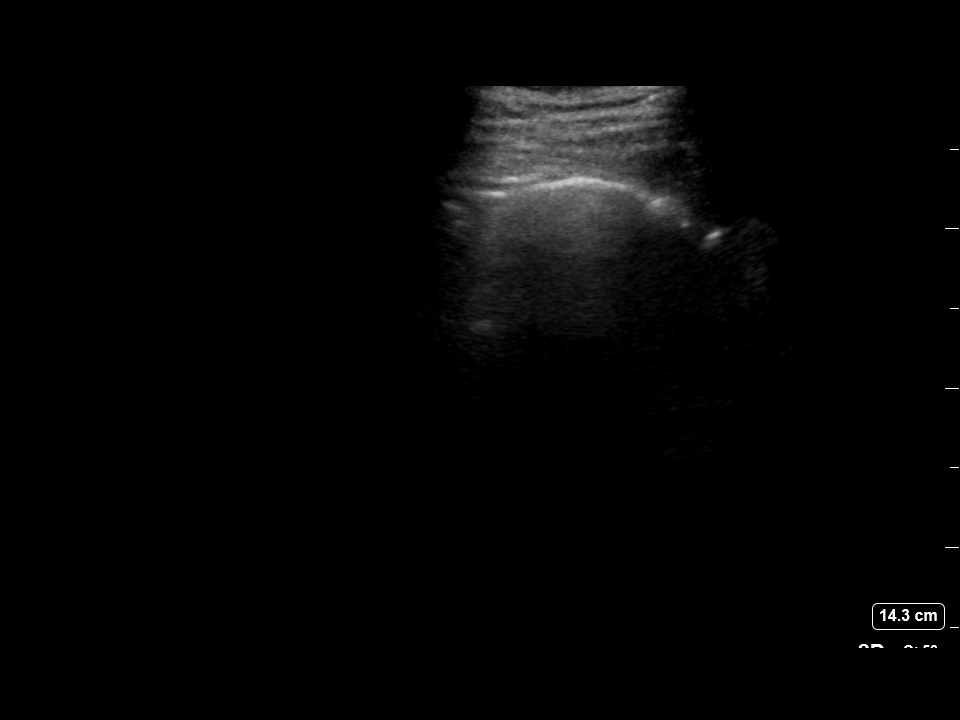

[1 of 1 positions shown; findings below may reference images not displayed]

EXAM:
ULTRASOUND GUIDED DIAGNOSTIC AND THERAPEUTIC RIGHT THORACENTESIS

MEDICATIONS:
10 mL 1% lidocaine

COMPLICATIONS:
None immediate.

PROCEDURE:
An ultrasound guided thoracentesis was thoroughly discussed with the
patient and questions answered. The benefits, risks, alternatives
and complications were also discussed. The patient understands and
wishes to proceed with the procedure. Written consent was obtained.

Ultrasound was performed to localize and mark an adequate pocket of
fluid in the right chest. The area was then prepped and draped in
the normal sterile fashion. 1% Lidocaine was used for local
anesthesia. Under ultrasound guidance a 6 Fr Safe-T-Centesis
catheter was introduced. Thoracentesis was performed. The catheter
was removed and a dressing applied.
FINDINGS: A total of approximately 800 mL of dark red fluid was removed.
Samples were sent to the laboratory as requested by the clinical
team.
IMPRESSION: Successful ultrasound guided right thoracentesis yielding 800 mL of
pleural fluid.

## 2020-03-09 IMAGING — DX CHEST - 2 VIEW
2 series · 2 of 2 positions shown · non-contrast
Comparison: 06/12/2019

CLINICAL DATA: Post mitral valve surgery 05/31/2019.

EXAM:
CHEST - 2 VIEW

[dg chest 2 view (1 of 2)]
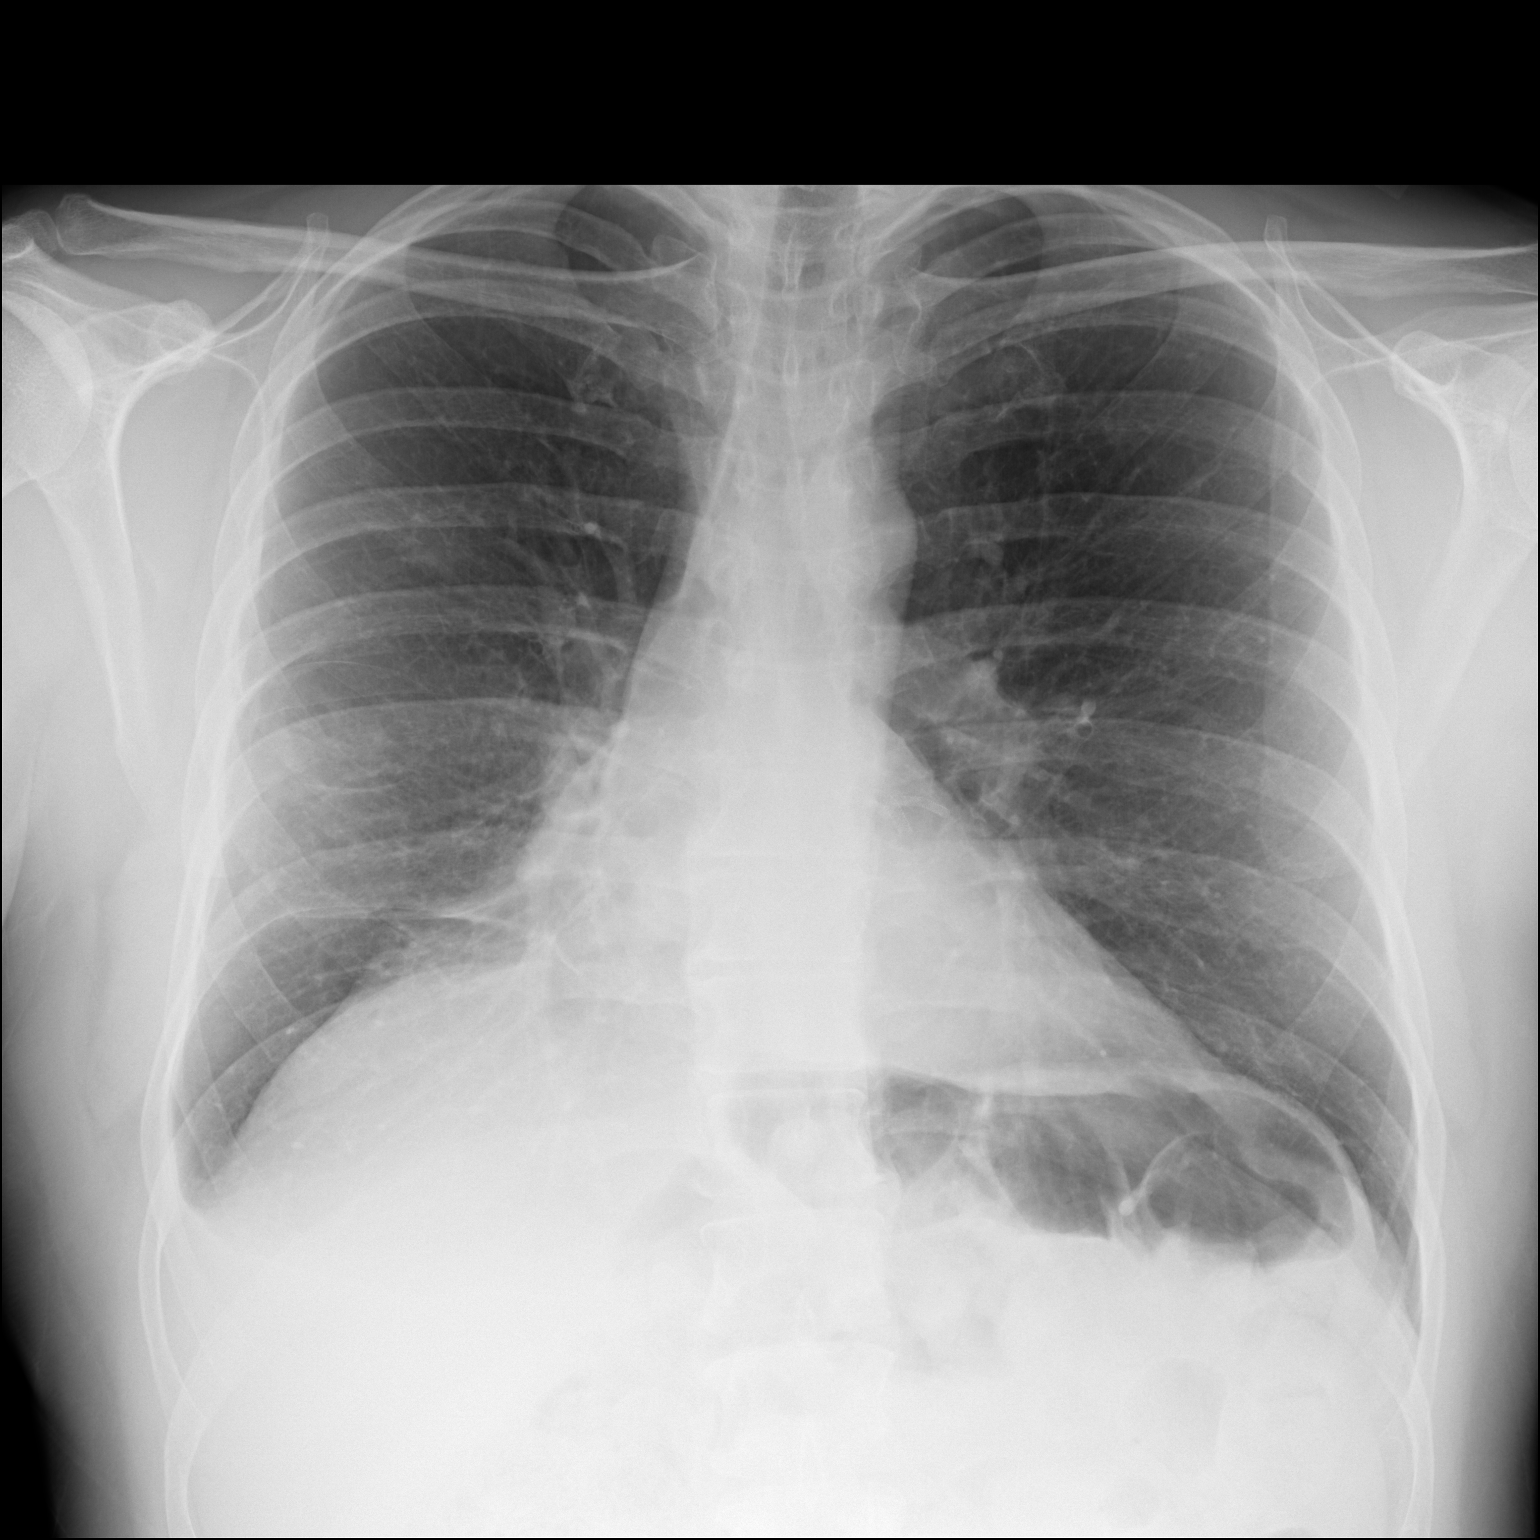

[dg chest 2 view (2 of 2)]
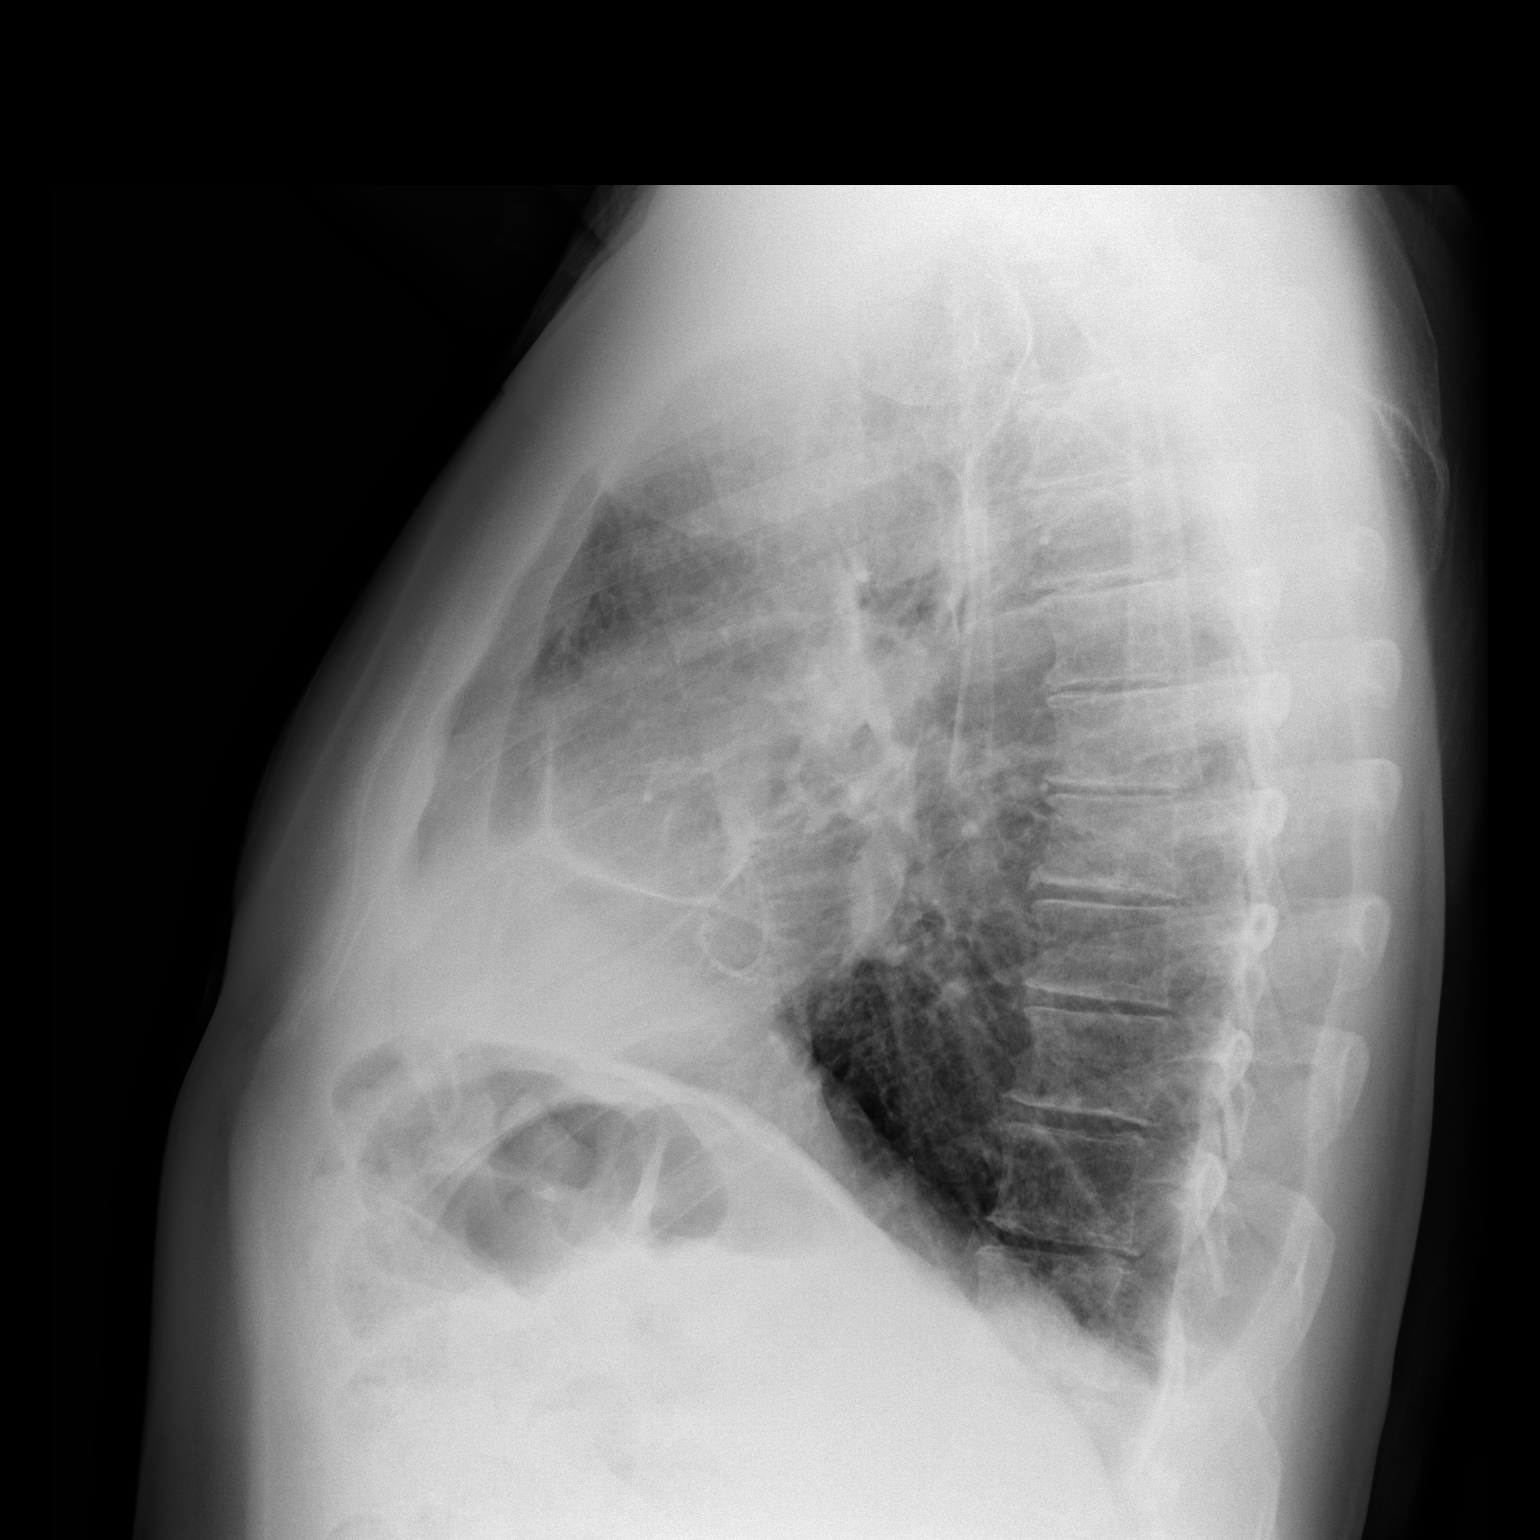

[2 of 2 positions shown; findings below may reference images not displayed]

FINDINGS: Lungs are adequately inflated with subtle blunting of the right
costophrenic angle. Minimal opacification in the medial right base
likely mild atelectasis. Evidence of patient's recent mitral valve
replacement. Cardiomediastinal silhouette and remainder the exam is
unchanged.
IMPRESSION: Tiny amount right pleural fluid and minimal medial right base
atelectasis. Evidence of recent mitral valve surgery.

## 2020-03-27 ENCOUNTER — Other Ambulatory Visit: Payer: Self-pay | Admitting: Interventional Cardiology

## 2020-06-09 ENCOUNTER — Encounter: Payer: BC Managed Care – PPO | Admitting: Thoracic Surgery (Cardiothoracic Vascular Surgery)

## 2020-06-09 ENCOUNTER — Other Ambulatory Visit: Payer: Self-pay

## 2020-06-09 ENCOUNTER — Encounter: Payer: Self-pay | Admitting: Thoracic Surgery (Cardiothoracic Vascular Surgery)

## 2020-06-09 ENCOUNTER — Ambulatory Visit: Payer: BC Managed Care – PPO | Admitting: Thoracic Surgery (Cardiothoracic Vascular Surgery)

## 2020-06-09 VITALS — BP 121/82 | HR 68 | Temp 97.3°F | Resp 16 | Ht 73.0 in | Wt 221.8 lb

## 2020-06-09 DIAGNOSIS — I341 Nonrheumatic mitral (valve) prolapse: Secondary | ICD-10-CM

## 2020-06-09 DIAGNOSIS — I340 Nonrheumatic mitral (valve) insufficiency: Secondary | ICD-10-CM | POA: Diagnosis not present

## 2020-06-09 DIAGNOSIS — Z9889 Other specified postprocedural states: Secondary | ICD-10-CM | POA: Diagnosis not present

## 2020-06-09 NOTE — Patient Instructions (Signed)

## 2020-06-09 NOTE — Progress Notes (Signed)
[address]       [name] [address]             [phone]     CARDIOTHORACIC SURGERY OFFICE NOTE  Primary Cardiologist is [name], MD PCP is [name], MD   HPI:  Patient is a 54 year old male with history of mitral valve prolapse and mitral regurgitation, coronary artery disease status post acute non-ST segment elevation myocardial infarction in 2015 treated with PCI and stenting of the diagonal branch,chronic sinusitis,and hyperlipidemia whounderwent minimally invasive mitral valve repair on May 31, 2019.  His postoperative recovery was uneventful and he returns the office today for routine follow-up now approximately 1 year following his surgery.  He reports that he is doing exceptionally well.  Overall he feels "much improved" in comparison with how he felt prior to surgery.  He states that his exercise tolerance is better than it has been in a long time.  He exercises routinely on a regular basis and states that he really feels good.  He states that he only gets short of breath with very strenuous exertion.  He never has any chest pain or chest tightness.  He no longer has any sort of pain or other symptoms related to his minithoracotomy approach.  He is delighted with his outcome.  He underwent follow-up echocardiogram within the first month following surgery which demonstrated normal left ventricular function and intact mitral valve repair.  He has not had a follow-up visit with Dr. [name] nor another follow-up echocardiogram since that time.   Current Outpatient Medications  Medication Sig Dispense Refill  . albuterol (PROAIR HFA) 108 (90 BASE) MCG/ACT inhaler Inhale 2 puffs into the lungs every 6 (six) hours as needed for wheezing or shortness of breath. 1 Inhaler prn  . amLODipine (NORVASC) 5 MG tablet Take 1 tablet (5 mg total) by mouth daily. 90 tablet 1  . aspirin EC 81 MG tablet Take 1 tablet (81 mg total) by mouth daily. 90 tablet 3    . budesonide (PULMICORT) 180 MCG/ACT inhaler Inhale into the lungs 2 (two) times daily.    . cetirizine (ZYRTEC) 10 MG tablet Take 10 mg by mouth daily as needed for allergies.     . fenofibrate 160 MG tablet Take 160 mg by mouth daily.     . fluticasone (CUTIVATE) 0.05 % cream Apply 1 application topically every evening.     . fluticasone (FLONASE) 50 MCG/ACT nasal spray Place 1 spray into both nostrils every evening.     Bess Harvest Ethyl (VASCEPA) 1 g CAPS Take 2 capsules (2 g total) by mouth 2 (two) times daily. 180 capsule 3  . metoprolol tartrate (LOPRESSOR) 25 MG tablet Take 0.5 tablets (12.5 mg total) by mouth 2 (two) times daily. 90 tablet 3  . Multiple Vitamin (MULTIVITAMIN WITH MINERALS) TABS tablet Take 1 tablet by mouth daily.    . rosuvastatin (CRESTOR) 10 MG tablet Take 1 tablet (10 mg total) by mouth daily. 90 tablet 3   No current facility-administered medications for this visit.      Physical Exam:   BP 121/82 (BP Location: Left Arm, Patient Position: Sitting, Cuff Size: Normal)   Pulse 68   Temp (!) 97.3 F (36.3 C)   Resp 16   Ht 6\' 1"  (1.854 m)   Wt 221 lb 12.8 oz (100.6 kg)   SpO2 94% Comment: RA  BMI 29.26 kg/m   General:  Well-appearing  Chest:  Clear to auscultation  CV:   Regular rate and rhythm without murmur  Incisions:  Completely healed  Abdomen:  Soft nontender  Extremities:  Warm and well-perfused  Diagnostic Tests:  n/a   Impression:  Patient is doing very well approximately 1 year status post minimally invasive mitral valve repair  Plan:  We have not recommended any changes to the patient's current medications.  However, it might be reasonable to consider stopping metoprolol as long as the patient's blood pressure remains normal.  We would leave any subsequent decisions regarding long-term medical management to Dr. Tamala Julian.  We discussed the role of periodic follow-up echocardiography.  The patient has been reminded regarding the  importance of dental hygiene and the lifelong need for antibiotic prophylaxis for all dental cleanings and other related invasive procedures.  The patient will continue to follow-up with Dr. Tamala Julian and return to our office in the future only should specific problems or questions arise.  I spent in excess of 15 minutes during the conduct of this office consultation and >50% of this time involved direct face-to-face encounter with the patient for counseling and/or coordination of their care.    Valentina Gu. Roxy Manns, MD 06/09/2020 12:15 PM

## 2020-07-04 ENCOUNTER — Other Ambulatory Visit: Payer: Self-pay | Admitting: Interventional Cardiology

## 2020-07-15 NOTE — Telephone Encounter (Signed)
**Note De-Identified Calvin [name] Obfuscation** Covermymeds message; Calvin [name] (Key: [id number])  This request has received a Favorable outcome.  Your request has been approved  PA Case: [id number], Coverage Starts on: 09/28/2019 12:00:00 AM, Coverage Ends on: 09/27/2020 12:00:00 AM.

## 2020-09-11 ENCOUNTER — Other Ambulatory Visit: Payer: Self-pay

## 2020-09-11 MED ORDER — METOPROLOL TARTRATE 25 MG PO TABS: 12.5000 mg | ORAL_TABLET | Freq: Two times a day (BID) | ORAL | 0 refills | Status: DC

## 2020-09-11 MED ORDER — ROSUVASTATIN CALCIUM 10 MG PO TABS: 10.0000 mg | ORAL_TABLET | Freq: Every day | ORAL | 0 refills | Status: DC

## 2020-09-11 MED ORDER — AMLODIPINE BESYLATE 5 MG PO TABS
5.0000 mg | ORAL_TABLET | Freq: Every day | ORAL | 0 refills | Status: DC
Start: 2020-09-11 — End: 2020-10-09

## 2020-09-16 ENCOUNTER — Telehealth: Payer: Self-pay

## 2020-09-16 NOTE — Telephone Encounter (Signed)
**Note De-Identified Calvin [name] Obfuscation** I started a Vasecepa PA through covermymeds: Key: [id number]

## 2020-09-18 NOTE — Telephone Encounter (Signed)
**Note De-Identified Lucrecia Mcphearson Obfuscation** Message received from covermymeds: Ginger Organ (Key: [id number])  Vascepa 1GM capsules  Form Photographer PA Form [phone] NCPDP)  Favorable  2 days ago  Prior authorization for Vascepa has been approved.   Message from plan: PA Case: [id number], Status: Approved, Coverage Starts on: 09/16/2020, Coverage Ends on: 09/16/2021.

## 2020-10-01 ENCOUNTER — Encounter: Payer: Self-pay | Admitting: Pulmonary Disease

## 2020-10-01 ENCOUNTER — Ambulatory Visit: Payer: BC Managed Care – PPO | Admitting: Pulmonary Disease

## 2020-10-01 ENCOUNTER — Other Ambulatory Visit: Payer: Self-pay

## 2020-10-01 ENCOUNTER — Ambulatory Visit (INDEPENDENT_AMBULATORY_CARE_PROVIDER_SITE_OTHER): Payer: BC Managed Care – PPO

## 2020-10-01 VITALS — BP 130/76 | HR 69 | Temp 97.2°F | Ht 73.0 in | Wt 221.0 lb

## 2020-10-01 DIAGNOSIS — J4540 Moderate persistent asthma, uncomplicated: Secondary | ICD-10-CM | POA: Diagnosis not present

## 2020-10-01 DIAGNOSIS — R059 Cough, unspecified: Secondary | ICD-10-CM

## 2020-10-01 DIAGNOSIS — Z23 Encounter for immunization: Secondary | ICD-10-CM | POA: Diagnosis not present

## 2020-10-01 LAB — CBC WITH DIFFERENTIAL/PLATELET
Basophils Absolute: 0.1 10*3/uL (ref 0.0–0.1)
Basophils Relative: 1.3 % (ref 0.0–3.0)
Eosinophils Absolute: 1.3 10*3/uL — ABNORMAL HIGH (ref 0.0–0.7)
Eosinophils Relative: 14.9 % — ABNORMAL HIGH (ref 0.0–5.0)
HCT: 44.7 % (ref 39.0–52.0)
Hemoglobin: 15 g/dL (ref 13.0–17.0)
Lymphocytes Relative: 30.7 % (ref 12.0–46.0)
Lymphs Abs: 2.7 10*3/uL (ref 0.7–4.0)
MCHC: 33.5 g/dL (ref 30.0–36.0)
MCV: 90.4 fl (ref 78.0–100.0)
Monocytes Absolute: 0.6 10*3/uL (ref 0.1–1.0)
Monocytes Relative: 6.9 % (ref 3.0–12.0)
Neutro Abs: 4 10*3/uL (ref 1.4–7.7)
Neutrophils Relative %: 46.2 % (ref 43.0–77.0)
Platelets: 346 10*3/uL (ref 150.0–400.0)
RBC: 4.95 Mil/uL (ref 4.22–5.81)
RDW: 13.8 % (ref 11.5–15.5)
WBC: 8.7 10*3/uL (ref 4.0–10.5)

## 2020-10-01 MED ORDER — MONTELUKAST SODIUM 10 MG PO TABS: 10.0000 mg | ORAL_TABLET | Freq: Every day | ORAL | 5 refills | Status: DC

## 2020-10-01 NOTE — Progress Notes (Signed)
Calvin [name]    [id number]    [birth year]  Primary Care Physician:Hedrick, Fayrene Fearing, MD  Referring Physician: [name], MD [address] [facility],  [place] [address]  Chief complaint: Consult for asthma  HPI: 54 year old with significant cardiac history including mitral valve prolapse, MR s/p mitral valve repair in 2020, coronary artery disease status post non-ST EMI with stenting in 2015, sinusitis, hyperlipidemia.  He has history of chronic rhinosinusitis, nasal polyps, asthma.  Previously evaluated by Dr. [name].  Maintained on Dulera in the past but changed to Symbicort due to insurance issues.  He feels that the [facility] works better for him.  In addition he is taking Flonase nasal spray regularly and cetirizine as needed  Has complains of chronic cough, sinus congestion, postnasal drip early in the morning.  Occasional wheezing with dyspnea.  Denies any fevers, chills.  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  No feather pillows or comforters Smoking history: Never smoker Travel history: No significant travel history Relevant family history: No significant family history of lung disease  Outpatient Encounter Medications as of 10/01/2020  Medication Sig  . albuterol (PROAIR HFA) 108 (90 BASE) MCG/ACT inhaler Inhale 2 puffs into the lungs every 6 (six) hours as needed for wheezing or shortness of breath.  Marland Kitchen amLODipine (NORVASC) 5 MG tablet Take 1 tablet (5 mg total) by mouth daily. Please make yearly appt with Dr. [name] for October before anymore refills. 1st attempt  . aspirin EC 81 MG tablet Take 1 tablet (81 mg total) by mouth daily.  . budesonide (PULMICORT) 180 MCG/ACT inhaler Inhale into the lungs 2 (two) times  daily.  . cetirizine (ZYRTEC) 10 MG tablet Take 10 mg by mouth daily as needed for allergies.   . fenofibrate 160 MG tablet Take 160 mg by mouth daily.   . fluticasone (CUTIVATE) 0.05 % cream Apply 1 application topically every evening.   . fluticasone (FLONASE) 50 MCG/ACT nasal spray Place 1 spray into both nostrils every evening.   Bess Harvest Ethyl (VASCEPA) 1 g CAPS Take 2 capsules (2 g total) by mouth 2 (two) times daily.  . metoprolol tartrate (LOPRESSOR) 25 MG tablet Take 0.5 tablets (12.5 mg total) by mouth 2 (two) times daily. Please make yearly appt with Dr. [name] for October before anymore refills. 1st attempt  . Multiple Vitamin (MULTIVITAMIN WITH MINERALS) TABS tablet Take 1 tablet by mouth daily.  . rosuvastatin (CRESTOR) 10 MG tablet Take 1 tablet (10 mg total) by mouth daily. Please make yearly appt with Dr. [name] for October before anymore refills. 1st attempt   No facility-administered encounter medications on file as of 10/01/2020.    Allergies as of 10/01/2020 - Review Complete 10/01/2020  Allergen Reaction Noted  . Brilinta [ticagrelor] Other (See Comments) 03/05/2019  . Daliresp [roflumilast] Other (See Comments) 08/29/2012    Past Medical History:  Diagnosis Date  . ACS (acute coronary syndrome) with NSTEMI 03/25/2014  . Acute sinus infection 03/27/2014  . Asthma   . Asthmatic bronchitis   . CAD (coronary artery disease), native coronary artery, DES placed to 1st diag. 03/26/14; residual disease to LAD with FFR to 0.62 and RCA of 50-60%- plan for PCI to LAD 03/27/2014  . Coronary  artery disease   . Dyslipidemia, goal LDL below 70 03/27/2014  . GI bleed 03/27/2014  . Hyperlipemia   . Hypertension    Norvasc  . MVP (mitral valve prolapse)    Dr [name]  . Non Q wave myocardial infarction (HCC) 03/23/2014  . Psoriasis   . Rhinitis   . S/P minimally invasive mitral valve repair 05/31/2019   Complex valvuloplasty including quadrangular resection of posterior leaflet, sliding  leaflet annuloplasty, autologous pericardial patch augmentation of P1 segment of posterior leaflet and 34 mm Sorin Memo 4D ring annuloplasty via right mini thoracotomy approach    Past Surgical History:  Procedure Laterality Date  . CORONARY ANGIOPLASTY WITH STENT PLACEMENT  03/25/2014; 04/16/2014   "1 + 1"  . FLEXIBLE SIGMOIDOSCOPY Left 03/28/2014   Procedure: FLEXIBLE SIGMOIDOSCOPY;  Surgeon: [name], MD;  Location[facility] ENDOSCOPY;  Service: Endoscopy;  Laterality: Left;  . IR THORACENTESIS ASP PLEURAL SPACE W/IMG GUIDE  06/12/2019  . LEFT HEART CATHETERIZATION WITH CORONARY ANGIOGRAM N/A 03/26/2014   Procedure: LEFT HEART CATHETERIZATION WITH CORONARY ANGIOGRAM;  Surgeon: [name], MD;  Location: [facility] CATH LAB;  Service: Cardiovascular;  Laterality: N/A;  . MITRAL VALVE REPAIR Right 05/31/2019   Procedure: MINIMALLY INVASIVE MITRAL VALVE REPAIR (MVR) Using Memo 4D Ring Size ;  Surgeon: Purcell Nails, MD;  Location: [facility] OR;  Service: Open Heart Surgery;  Laterality: Right;  . nasal polyps    . PERCUTANEOUS CORONARY STENT INTERVENTION (PCI-S) N/A 04/16/2014   Procedure: PERCUTANEOUS CORONARY STENT INTERVENTION (PCI-S);  Surgeon: [name], MD;  Location: [facility] CATH LAB;  Service: Cardiovascular;  Laterality: N/A;  . RIGHT/LEFT HEART CATH AND CORONARY ANGIOGRAPHY N/A 03/14/2019   Procedure: RIGHT/LEFT HEART CATH AND CORONARY ANGIOGRAPHY;  Surgeon: [name], MD;  Location: [facility] INVASIVE CV LAB;  Service: Cardiovascular;  Laterality: N/A;  . TEE WITHOUT CARDIOVERSION N/A 03/05/2019   Procedure: TRANSESOPHAGEAL ECHOCARDIOGRAM (TEE);  Surgeon: [name], MD;  Location: [facility] ENDOSCOPY;  Service: Cardiovascular;  Laterality: N/A;  . TEE WITHOUT CARDIOVERSION N/A 05/31/2019   Procedure: TRANSESOPHAGEAL ECHOCARDIOGRAM (TEE);  Surgeon: Purcell Nails, MD;  Location: [facility] OR;  Service: Open Heart Surgery;  Laterality: N/A;  . WISDOM TOOTH EXTRACTION      Family History  Problem  Relation Age of Onset  . Heart disease Mother        deceased age 32  . Emphysema Maternal Grandmother        non smoker  . Asthma Maternal Grandmother   . Hypertension Maternal Grandmother   . Prostate cancer Paternal Grandfather   . Heart disease Maternal Uncle        valve replaced; deceased age 66  . Stroke Maternal Uncle   . Heart attack Neg Hx     Social History   Socioeconomic History  . Marital status: Married    Spouse name: Not on file  . Number of children: 0  . Years of education: Not on file  . Highest education level: Not on file  Occupational History  . Occupation: Insurance underwriter  Tobacco Use  . Smoking status: Never Smoker  . Smokeless tobacco: Never Used  Substance and Sexual Activity  . Alcohol use: No  . Drug use: No  . Sexual activity: Yes  Other Topics Concern  . Not on file  Social History Narrative  . Not on file   Social Determinants of Health   Financial Resource Strain:   . Difficulty of Paying Living Expenses:  Not on file  Food Insecurity:   . Worried About Programme researcher, broadcasting/film/videounning Out of Food in the Last Year: Not on file  . Ran Out of Food in the Last Year: Not on file  Transportation Needs:   . Lack of Transportation (Medical): Not on file  . Lack of Transportation (Non-Medical): Not on file  Physical Activity:   . Days of Exercise per Week: Not on file  . Minutes of Exercise per Session: Not on file  Stress:   . Feeling of Stress : Not on file  Social Connections:   . Frequency of Communication with Friends and Family: Not on file  . Frequency of Social Gatherings with Friends and Family: Not on file  . Attends Religious Services: Not on file  . Active Member of Clubs or Organizations: Not on file  . Attends BankerClub or Organization Meetings: Not on file  . Marital Status: Not on file  Intimate Partner Violence:   . Fear of Current or Ex-Partner: Not on file  . Emotionally Abused: Not on file  . Physically Abused: Not on file  . Sexually Abused:  Not on file    Review of systems: Review of Systems  Constitutional: Negative for fever and chills.  HENT: Negative.   Eyes: Negative for blurred vision.  Respiratory: as per HPI  Cardiovascular: Negative for chest pain and palpitations.  Gastrointestinal: Negative for vomiting, diarrhea, blood per rectum. Genitourinary: Negative for dysuria, urgency, frequency and hematuria.  Musculoskeletal: Negative for myalgias, back pain and joint pain.  Skin: Negative for itching and rash.  Neurological: Negative for dizziness, tremors, focal weakness, seizures and loss of consciousness.  Endo/Heme/Allergies: Negative for environmental allergies.  Psychiatric/Behavioral: Negative for depression, suicidal ideas and hallucinations.  All other systems reviewed and are negative.  Physical Exam: Blood pressure 130/76, pulse 69, temperature (!) 97.2 F (36.2 C), temperature source Skin, height 6\' 1"  (1.854 m), weight 221 lb (100.2 kg), SpO2 97 %. Gen:      No acute distress HEENT:  EOMI, sclera anicteric Neck:     No masses; no thyromegaly Lungs:    Clear to auscultation bilaterally; normal respiratory effort CV:         Regular rate and rhythm; no murmurs Abd:      + bowel sounds; soft, non-tender; no palpable masses, no distension Ext:    No edema; adequate peripheral perfusion Skin:      Warm and dry; no rash Neuro: alert and oriented x 3 Psych: normal mood and affect  Data Reviewed: Imaging: CTA 06/11/2019-trace right pneumothorax with moderate right effusion, bibasal atelectasis, subcutaneous gas along the chest wall likely related to recent mitral valve surgery Chest x-ray 08/07/2019-basal atelectasis I have reviewed the images personally.  PFTs: 07/05/1929 FVC 6.07 [113,000), FEV1 6.90 [99%], F/F 64, TLC 6.73 [115%], DLCO 29 [100%] Mild obstructive airway disease with normal lung volumes and normal diffusion  Act score 10/01/2020-23  Labs: CBC 08/15/2019-WBC 7.5, eos 22.7%, absolute  eosinophil count 1703 RAST panel 01/17/2012-IgE 111, negative allergies  Assessment:  Moderate asthma, chronic rhinosinusitis with polyposis Prior labs indicate T2 high inflammation with elevated eosinophils Continue Symbicort, Flonase and over-the-counter antihistamines Add Singulair  Recheck CBC differential, respiratory allergy profile including IgE and chest x-ray Schedule pulmonary function test He may be a candidate for biologics in the future but at present his symptoms appear reasonably controlled with act score of 23  Plan/Recommendations: Symbicort, Flonase, antihistamine Start Singulair CBC, respiratory profile, chest x-ray PFTs  Vickie Melnik MD [place]  [facility] 10/01/2020, 9:04 AM  CC: [name], MD

## 2020-10-01 NOTE — Patient Instructions (Signed)
Continue the Symbicort inhaler We will start you on Singulair at night Continue Flonase nasal spray  We will check some labs today including CBC differential, respiratory allergy profile Chest x-ray and schedule pulmonary function test Return to clinic in 1 to 2 months for review and reassessment. If you still continue to have symptoms then we may consider additional injection therapy.

## 2020-10-02 LAB — RESPIRATORY ALLERGY PROFILE REGION II ~~LOC~~
Allergen, A. alternata, m6: <0.1 kU/L
Allergen, Cedar tree, t12: <0.1 kU/L
Allergen, Comm Silver Birch, t9: <0.1 kU/L
Allergen, Cottonwood, t14: <0.1 kU/L
Allergen, D pternoyssinus,d7: <0.1 kU/L
Allergen, Mouse Urine Protein, e78: <0.1 kU/L
Allergen, Mulberry, t76: <0.1 kU/L
Allergen, Oak,t7: <0.1 kU/L
Allergen, P. notatum, m1: <0.1 kU/L
Aspergillus fumigatus, m3: <0.1 kU/L
Bermuda Grass: <0.1 kU/L
Box Elder IgE: <0.1 kU/L
CLADOSPORIUM HERBARUM (M2) IGE: <0.1 kU/L
COMMON RAGWEED (SHORT) (W1) IGE: <0.1 kU/L
Cat Dander: <0.1 kU/L
Class: 0
Class: 0
Class: 0
Class: 0
Class: 0
Class: 0
Class: 0
Class: 0
Class: 0
Class: 0
Class: 0
Class: 0
Class: 0
Class: 0
Class: 0
Class: 0
Class: 0
Class: 0
Class: 0
Class: 0
Class: 0
Class: 0
Class: 0
Class: 0
Cockroach: <0.1 kU/L
D. farinae: <0.1 kU/L
Dog Dander: <0.1 kU/L
Elm IgE: <0.1 kU/L
IgE (Immunoglobulin E), Serum: 166 kU/L — ABNORMAL HIGH (ref <=114)
Johnson Grass: <0.1 kU/L
Pecan/Hickory Tree IgE: <0.1 kU/L
Rough Pigweed  IgE: <0.1 kU/L
Sheep Sorrel IgE: <0.1 kU/L
Timothy Grass: <0.1 kU/L

## 2020-10-02 LAB — INTERPRETATION:

## 2020-10-05 ENCOUNTER — Other Ambulatory Visit: Payer: Self-pay | Admitting: Interventional Cardiology

## 2020-10-07 ENCOUNTER — Other Ambulatory Visit: Payer: Self-pay

## 2020-10-07 MED ORDER — ICOSAPENT ETHYL 1 G PO CAPS: 2.0000 g | ORAL_CAPSULE | Freq: Two times a day (BID) | ORAL | 0 refills | Status: DC

## 2020-10-09 ENCOUNTER — Other Ambulatory Visit: Payer: Self-pay | Admitting: Interventional Cardiology

## 2020-11-26 ENCOUNTER — Ambulatory Visit: Payer: BC Managed Care – PPO | Admitting: Pulmonary Disease

## 2020-11-26 ENCOUNTER — Ambulatory Visit (INDEPENDENT_AMBULATORY_CARE_PROVIDER_SITE_OTHER): Payer: BC Managed Care – PPO | Admitting: Pulmonary Disease

## 2020-11-26 ENCOUNTER — Other Ambulatory Visit: Payer: Self-pay

## 2020-11-26 ENCOUNTER — Encounter: Payer: Self-pay | Admitting: Pulmonary Disease

## 2020-11-26 VITALS — BP 138/70 | HR 66 | Temp 97.4°F | Ht 73.0 in | Wt 219.0 lb

## 2020-11-26 DIAGNOSIS — R059 Cough, unspecified: Secondary | ICD-10-CM

## 2020-11-26 DIAGNOSIS — J4540 Moderate persistent asthma, uncomplicated: Secondary | ICD-10-CM | POA: Diagnosis not present

## 2020-11-26 LAB — PULMONARY FUNCTION TEST
DL/VA % pred: 95 %
DL/VA: 4.07 ml/min/mmHg/L
DLCO cor % pred: 96 %
DLCO cor: 30.13 ml/min/mmHg
DLCO unc % pred: 97 %
DLCO unc: 30.46 ml/min/mmHg
FEF 25-75 Post: 2.16 L/sec
FEF 25-75 Pre: 1.87 L/sec
FEF2575-%Change-Post: 15 %
FEF2575-%Pred-Post: 61 %
FEF2575-%Pred-Pre: 53 %
FEV1-%Change-Post: 7 %
FEV1-%Pred-Post: 79 %
FEV1-%Pred-Pre: 74 %
FEV1-Post: 3.3 L
FEV1-Pre: 3.09 L
FEV1FVC-%Change-Post: 7 %
FEV1FVC-%Pred-Pre: 84 %
FEV6-%Change-Post: 0 %
FEV6-%Pred-Post: 90 %
FEV6-%Pred-Pre: 90 %
FEV6-Post: 4.72 L
FEV6-Pre: 4.75 L
FEV6FVC-%Pred-Post: 104 %
FEV6FVC-%Pred-Pre: 104 %
FVC-%Change-Post: 0 %
FVC-%Pred-Post: 86 %
FVC-%Pred-Pre: 87 %
FVC-Post: 4.72 L
FVC-Pre: 4.75 L
Post FEV1/FVC ratio: 70 %
Post FEV6/FVC ratio: 100 %
Pre FEV1/FVC ratio: 65 %
Pre FEV6/FVC Ratio: 100 %
RV % pred: 189 %
RV: 4.36 L
TLC % pred: 133 %
TLC: 10.13 L

## 2020-11-26 NOTE — Progress Notes (Signed)
Calvin [name]    [id number]    1966/02/14  Primary Care Physician:Hedrick, Fayrene Fearing, MD  Referring Physician: [name], MD [address] [facility] [place],  [place] [address]  Chief complaint: Consult for asthma  HPI: 54 year old with significant cardiac history including mitral valve prolapse, MR s/p mitral valve repair in 2020, coronary artery disease status post non-ST EMI with stenting in 2015, sinusitis, hyperlipidemia.  He has history of chronic rhinosinusitis, nasal polyps, asthma.  Previously evaluated by Dr. [name].  Maintained on Dulera in the past but changed to Symbicort due to insurance issues.  He feels that the [facility] works better for him.  In addition he is taking Flonase nasal spray regularly and cetirizine as needed  Has complains of chronic cough, sinus congestion, postnasal drip early in the morning.  Occasional wheezing with dyspnea.  Denies any fevers, chills.  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  No feather pillows or comforters Smoking history: Never smoker Travel history: No significant travel history Relevant family history: No significant family history of lung disease  Interim history: Started on Singulair at last visit, continues on Symbicort, Flonase and antihistamines States that his breathing is actually doing better.  Hardly needs to use his rescue inhaler.  Outpatient Encounter Medications as of 11/26/2020  Medication Sig  . albuterol (PROAIR HFA) 108 (90 BASE) MCG/ACT inhaler Inhale 2 puffs into the lungs every 6 (six) hours as needed for wheezing or shortness of breath.  Marland Kitchen amLODipine (NORVASC) 5 MG tablet TAKE 1 TABLET BY MOUTH EVERY DAY  . aspirin EC 81 MG tablet Take 1 tablet (81 mg total)  by mouth daily.  . budesonide-formoterol (SYMBICORT) 80-4.5 MCG/ACT inhaler Inhale into the lungs.  . cetirizine (ZYRTEC) 10 MG tablet Take 10 mg by mouth daily as needed for allergies.   . fenofibrate 160 MG tablet Take 160 mg by mouth daily.   . fluticasone (CUTIVATE) 0.05 % cream Apply 1 application topically every evening.   . fluticasone (FLONASE) 50 MCG/ACT nasal spray Place 1 spray into both nostrils every evening.   Marland Kitchen icosapent Ethyl (VASCEPA) 1 g capsule Take 2 capsules (2 g total) by mouth 2 (two) times daily. Please make overdue appt with Dr. [name] before anymore refills. 1st attempt  . metoprolol tartrate (LOPRESSOR) 25 MG tablet Take 0.5 tablets (12.5 mg total) by mouth 2 (two) times daily. Please make yearly appt with Dr. [name] for October before anymore refills. 1st attempt  . montelukast (SINGULAIR) 10 MG tablet Take 1 tablet (10 mg total) by mouth at bedtime.  . Multiple Vitamin (MULTIVITAMIN WITH MINERALS) TABS tablet Take 1 tablet by mouth daily.  . rosuvastatin (CRESTOR) 10 MG tablet Take 1 tablet (10 mg total) by mouth daily. Please make yearly appt with Dr. [name] for October before anymore refills. 1st attempt  . [DISCONTINUED] budesonide (PULMICORT) 180 MCG/ACT inhaler Inhale into the lungs 2 (two) times daily.   No facility-administered encounter medications on file as of 11/26/2020.    Allergies as of 11/26/2020 - Review Complete 11/26/2020  Allergen Reaction Noted  . Brilinta [ticagrelor] Other (See Comments) 03/05/2019  . Daliresp [roflumilast] Other (See Comments) 08/29/2012    Physical Exam: Blood pressure 138/70, pulse 66, temperature (!) 97.4  F (36.3 C), temperature source Skin, height 6\' 1"  (1.854 m), weight 219 lb (99.3 kg), SpO2 97 %. Gen:      No acute distress HEENT:  EOMI, sclera anicteric Neck:     No masses; no thyromegaly Lungs:    Clear to auscultation bilaterally; normal respiratory effort CV:         Regular rate and rhythm; no murmurs Abd:       + bowel sounds; soft, non-tender; no palpable masses, no distension Ext:    No edema; adequate peripheral perfusion Skin:      Warm and dry; no rash Neuro: alert and oriented x 3 Psych: normal mood and affect  Data Reviewed: Imaging: CTA 06/11/2019-trace right pneumothorax with moderate right effusion, bibasal atelectasis, subcutaneous gas along the chest wall likely related to recent mitral valve surgery Chest x-ray 08/07/2019-basal atelectasis I have reviewed the images personally.  PFTs: 07/05/2012 FVC 6.07 [113,000), FEV1 6.90 [99%], F/F 64, TLC 6.73 [115%], DLCO 29 [100%] Mild obstructive airway disease with normal lung volumes and normal diffusion  11/26/2020 FVC 4.72 [86%], FEV1 3.30 [10 9%], F/F 70, TLC 3.22 (133%), DLCO 30.46 (7%] Moderate obstructive airway disease with air trapping and hyperinflation  ACT score 10/01/2020-23  continue and ask: Home Labs: CBC 08/15/2019-WBC 7.5, eos 22.7%, absolute eosinophil count 1703 RAST panel 01/17/2012-IgE 111, negative allergies  Assessment:  Moderate asthma, chronic rhinosinusitis with polyposis Labs indicate T2 high inflammation with elevated eosinophils Continue Symbicort, Singulair, Flonase and over-the-counter antihistamines PFTs reviewed with moderate obstruction  So far he is doing well with current regimen with good control of symptoms. He may be a candidate for biologics in the future but at present his symptoms appear reasonably controlled with act score of 23  Plan/Recommendations: Symbicort, Flonase, antihistamine Singulair  Follow-up in 6 months  01/19/2012 MD [place] [facility] 11/26/2020, 10:16 AM  CC: 14/12/2019, MD

## 2020-11-26 NOTE — Progress Notes (Signed)
Calvin [name]    [id number]    Apr 04, 1966  Primary Care Physician:Hedrick, Fayrene Fearing, MD  Referring Physician: [name], MD [address] [facility] [place],  [place] [address]  Chief complaint: Consult for asthma  HPI: 54 year old with significant cardiac history including mitral valve prolapse, MR s/p mitral valve repair in 2020, coronary artery disease status post non-ST EMI with stenting in 2015, sinusitis, hyperlipidemia.  He has history of chronic rhinosinusitis, nasal polyps, asthma.  Previously evaluated by Dr. [name].  Maintained on Dulera in the past but changed to Symbicort due to insurance issues.  He feels that the [facility] works better for him.  In addition he is taking Flonase nasal spray regularly and cetirizine as needed  Has complains of chronic cough, sinus congestion, postnasal drip early in the morning.  Occasional wheezing with dyspnea.  Denies any fevers, chills.  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  No feather pillows or comforters Smoking history: Never smoker Travel history: No significant travel history Relevant family history: No significant family history of lung disease  Outpatient Encounter Medications as of 11/26/2020  Medication Sig  . albuterol (PROAIR HFA) 108 (90 BASE) MCG/ACT inhaler Inhale 2 puffs into the lungs every 6 (six) hours as needed for wheezing or shortness of breath.  Marland Kitchen amLODipine (NORVASC) 5 MG tablet TAKE 1 TABLET BY MOUTH EVERY DAY  . aspirin EC 81 MG tablet Take 1 tablet (81 mg total) by mouth daily.  . budesonide-formoterol (SYMBICORT) 80-4.5 MCG/ACT inhaler Inhale into the lungs.  . cetirizine (ZYRTEC) 10 MG tablet Take 10 mg by mouth daily as needed for allergies.   .  fenofibrate 160 MG tablet Take 160 mg by mouth daily.   . fluticasone (CUTIVATE) 0.05 % cream Apply 1 application topically every evening.   . fluticasone (FLONASE) 50 MCG/ACT nasal spray Place 1 spray into both nostrils every evening.   Marland Kitchen icosapent Ethyl (VASCEPA) 1 g capsule Take 2 capsules (2 g total) by mouth 2 (two) times daily. Please make overdue appt with Dr. [name] before anymore refills. 1st attempt  . metoprolol tartrate (LOPRESSOR) 25 MG tablet Take 0.5 tablets (12.5 mg total) by mouth 2 (two) times daily. Please make yearly appt with Dr. [name] for October before anymore refills. 1st attempt  . montelukast (SINGULAIR) 10 MG tablet Take 1 tablet (10 mg total) by mouth at bedtime.  . Multiple Vitamin (MULTIVITAMIN WITH MINERALS) TABS tablet Take 1 tablet by mouth daily.  . rosuvastatin (CRESTOR) 10 MG tablet Take 1 tablet (10 mg total) by mouth daily. Please make yearly appt with Dr. [name] for October before anymore refills. 1st attempt  . [DISCONTINUED] budesonide (PULMICORT) 180 MCG/ACT inhaler Inhale into the lungs 2 (two) times daily.   No facility-administered encounter medications on file as of 11/26/2020.    Allergies as of 11/26/2020 - Review Complete 11/26/2020  Allergen Reaction Noted  . Brilinta [ticagrelor] Other (See Comments) 03/05/2019  . Daliresp [roflumilast] Other (See Comments) 08/29/2012    Past Medical History:  Diagnosis Date  . ACS (acute coronary syndrome) with NSTEMI 03/25/2014  . Acute sinus infection 03/27/2014  . Asthma   . Asthmatic bronchitis   . CAD (coronary artery disease), native coronary artery, DES placed  to 1st diag. 03/26/14; residual disease to LAD with FFR to 0.62 and RCA of 50-60%- plan for PCI to LAD 03/27/2014  . Coronary artery disease   . Dyslipidemia, goal LDL below 70 03/27/2014  . GI bleed 03/27/2014  . Hyperlipemia   . Hypertension    Norvasc  . MVP (mitral valve prolapse)    Dr [name]  . Non Q wave myocardial infarction (HCC) 03/23/2014    . Psoriasis   . Rhinitis   . S/P minimally invasive mitral valve repair 05/31/2019   Complex valvuloplasty including quadrangular resection of posterior leaflet, sliding leaflet annuloplasty, autologous pericardial patch augmentation of P1 segment of posterior leaflet and 34 mm Sorin Memo 4D ring annuloplasty via right mini thoracotomy approach    Past Surgical History:  Procedure Laterality Date  . CORONARY ANGIOPLASTY WITH STENT PLACEMENT  03/25/2014; 04/16/2014   "1 + 1"  . FLEXIBLE SIGMOIDOSCOPY Left 03/28/2014   Procedure: FLEXIBLE SIGMOIDOSCOPY;  Surgeon: [name], MD;  Location: [facility] ENDOSCOPY;  Service: Endoscopy;  Laterality: Left;  . IR THORACENTESIS ASP PLEURAL SPACE W/IMG GUIDE  06/12/2019  . LEFT HEART CATHETERIZATION WITH CORONARY ANGIOGRAM N/A 03/26/2014   Procedure: LEFT HEART CATHETERIZATION WITH CORONARY ANGIOGRAM;  Surgeon: [name], MD;  Location: [facility] CATH LAB;  Service: Cardiovascular;  Laterality: N/A;  . MITRAL VALVE REPAIR Right 05/31/2019   Procedure: MINIMALLY INVASIVE MITRAL VALVE REPAIR (MVR) Using Memo 4D Ring Size ;  Surgeon: Purcell Nails, MD;  Location: [facility] OR;  Service: Open Heart Surgery;  Laterality: Right;  . nasal polyps    . PERCUTANEOUS CORONARY STENT INTERVENTION (PCI-S) N/A 04/16/2014   Procedure: PERCUTANEOUS CORONARY STENT INTERVENTION (PCI-S);  Surgeon: [name], MD;  Location: [facility] CATH LAB;  Service: Cardiovascular;  Laterality: N/A;  . RIGHT/LEFT HEART CATH AND CORONARY ANGIOGRAPHY N/A 03/14/2019   Procedure: RIGHT/LEFT HEART CATH AND CORONARY ANGIOGRAPHY;  Surgeon: [name], MD;  Location: [facility] INVASIVE CV LAB;  Service: Cardiovascular;  Laterality: N/A;  . TEE WITHOUT CARDIOVERSION N/A 03/05/2019   Procedure: TRANSESOPHAGEAL ECHOCARDIOGRAM (TEE);  Surgeon: [name], MD;  Location: [facility] ENDOSCOPY;  Service: Cardiovascular;  Laterality: N/A;  . TEE WITHOUT CARDIOVERSION N/A 05/31/2019   Procedure: TRANSESOPHAGEAL  ECHOCARDIOGRAM (TEE);  Surgeon: Purcell Nails, MD;  Location: [facility] OR;  Service: Open Heart Surgery;  Laterality: N/A;  . WISDOM TOOTH EXTRACTION      Family History  Problem Relation Age of Onset  . Heart disease Mother        deceased age 23  . Emphysema Maternal Grandmother        non smoker  . Asthma Maternal Grandmother   . Hypertension Maternal Grandmother   . Prostate cancer Paternal Grandfather   . Heart disease Maternal Uncle        valve replaced; deceased age 63  . Stroke Maternal Uncle   . Heart attack Neg Hx     Social History   Socioeconomic History  . Marital status: Married    Spouse name: Not on file  . Number of children: 0  . Years of education: Not on file  . Highest education level: Not on file  Occupational History  . Occupation: Insurance underwriter  Tobacco Use  . Smoking status: Never Smoker  . Smokeless tobacco: Never Used  Substance and Sexual Activity  . Alcohol use: No  . Drug use: No  . Sexual activity: Yes  Other Topics Concern  . Not on file  Social  History Narrative  . Not on file   Social Determinants of Health   Financial Resource Strain:   . Difficulty of Paying Living Expenses: Not on file  Food Insecurity:   . Worried About Programme researcher, broadcasting/film/videounning Out of Food in the Last Year: Not on file  . Ran Out of Food in the Last Year: Not on file  Transportation Needs:   . Lack of Transportation (Medical): Not on file  . Lack of Transportation (Non-Medical): Not on file  Physical Activity:   . Days of Exercise per Week: Not on file  . Minutes of Exercise per Session: Not on file  Stress:   . Feeling of Stress : Not on file  Social Connections:   . Frequency of Communication with Friends and Family: Not on file  . Frequency of Social Gatherings with Friends and Family: Not on file  . Attends Religious Services: Not on file  . Active Member of Clubs or Organizations: Not on file  . Attends BankerClub or Organization Meetings: Not on file  . Marital Status:  Not on file  Intimate Partner Violence:   . Fear of Current or Ex-Partner: Not on file  . Emotionally Abused: Not on file  . Physically Abused: Not on file  . Sexually Abused: Not on file    Review of systems: Review of Systems  Constitutional: Negative for fever and chills.  HENT: Negative.   Eyes: Negative for blurred vision.  Respiratory: as per HPI  Cardiovascular: Negative for chest pain and palpitations.  Gastrointestinal: Negative for vomiting, diarrhea, blood per rectum. Genitourinary: Negative for dysuria, urgency, frequency and hematuria.  Musculoskeletal: Negative for myalgias, back pain and joint pain.  Skin: Negative for itching and rash.  Neurological: Negative for dizziness, tremors, focal weakness, seizures and loss of consciousness.  Endo/Heme/Allergies: Negative for environmental allergies.  Psychiatric/Behavioral: Negative for depression, suicidal ideas and hallucinations.  All other systems reviewed and are negative.  Physical Exam: Blood pressure 130/76, pulse 69, temperature (!) 97.2 F (36.2 C), temperature source Skin, height 6\' 1"  (1.854 m), weight 221 lb (100.2 kg), SpO2 97 %. Gen:      No acute distress HEENT:  EOMI, sclera anicteric Neck:     No masses; no thyromegaly Lungs:    Clear to auscultation bilaterally; normal respiratory effort CV:         Regular rate and rhythm; no murmurs Abd:      + bowel sounds; soft, non-tender; no palpable masses, no distension Ext:    No edema; adequate peripheral perfusion Skin:      Warm and dry; no rash Neuro: alert and oriented x 3 Psych: normal mood and affect  Data Reviewed: Imaging: CTA 06/11/2019-trace right pneumothorax with moderate right effusion, bibasal atelectasis, subcutaneous gas along the chest wall likely related to recent mitral valve surgery Chest x-ray 08/07/2019-basal atelectasis I have reviewed the images personally.  PFTs: 07/05/1929 FVC 6.07 [113,000), FEV1 6.90 [99%], F/F 64, TLC 6.73  [115%], DLCO 29 [100%] Mild obstructive airway disease with normal lung volumes and normal diffusion  Act score 10/01/2020-23  Labs: CBC 08/15/2019-WBC 7.5, eos 22.7%, absolute eosinophil count 1703 RAST panel 01/17/2012-IgE 111, negative allergies  Assessment:  Moderate asthma, chronic rhinosinusitis with polyposis Prior labs indicate T2 high inflammation with elevated eosinophils Continue Symbicort, Flonase and over-the-counter antihistamines Add Singulair  Recheck CBC differential, respiratory allergy profile including IgE and chest x-ray Schedule pulmonary function test He may be a candidate for biologics in the future but at present his symptoms  appear reasonably controlled with act score of 23  Plan/Recommendations: Symbicort, Flonase, antihistamine Start Singulair CBC, respiratory profile, chest x-ray PFTs  Chilton Greathouse MD [facility] 11/26/2020, 10:20 AM  CC: [name], MD

## 2020-11-26 NOTE — Patient Instructions (Signed)
I have reviewed your labs and your lung function test which confirms allergies and asthma I am glad that your breathing is doing well with the current regimen that we have  Follow-up in 6 months.

## 2020-11-26 NOTE — Progress Notes (Signed)
Full PFT performed today. °

## 2020-12-17 ENCOUNTER — Other Ambulatory Visit: Payer: Self-pay

## 2020-12-17 MED ORDER — ICOSAPENT ETHYL 1 G PO CAPS: 2.0000 g | ORAL_CAPSULE | Freq: Two times a day (BID) | ORAL | 3 refills | Status: DC

## 2020-12-25 ENCOUNTER — Other Ambulatory Visit: Payer: Self-pay

## 2020-12-25 MED ORDER — ROSUVASTATIN CALCIUM 10 MG PO TABS: 10.0000 mg | ORAL_TABLET | Freq: Every day | ORAL | 1 refills | Status: DC

## 2020-12-31 ENCOUNTER — Other Ambulatory Visit: Payer: Self-pay

## 2020-12-31 MED ORDER — AMLODIPINE BESYLATE 5 MG PO TABS
5.0000 mg | ORAL_TABLET | Freq: Every day | ORAL | 0 refills | Status: DC
Start: 2020-12-31 — End: 2021-04-15

## 2021-03-26 NOTE — Progress Notes (Signed)
Cardiology Office Note:    Date:  03/27/2021   ID:  Calvin Calvin, DOB 10-10-1966, MRN [id number]  PCP:  Calvin Mina, MD  Cardiologist:  Calvin Noe, MD   Referring MD: Calvin Mina, MD   Chief Complaint  Patient presents with  . Coronary Artery Disease  . Cardiac Valve Problem    History of Present Illness:    Calvin Calvin is a 55 y.o. male with a hx of coronary artery disease status postDESLAD and diagonal3/2015, HLD, severe mitral valve regurgitation due to prolapse status post minimally invasive mitral valve repair byDr. Owen6/03/2019. Preop cath showed patent stents in the LAD & diagonal, 70-80% ostial Cfx, 50-60% mid Cfx, 40% prox-med RCA, Readmitted 06/11/19 withright pleural effusion now resolved.   Syncope x3 after receiving the booster shot for COVID-19.  Has a lifelong history of fainting when ill.  He has not had angina.  He denies orthopnea, and PND.  No lower extremity swelling.  Able to work without difficulty.  Shortness of breath present at the time of valve repair has resolved.    Past Medical History:  Diagnosis Date  . ACS (acute coronary syndrome) with NSTEMI 03/25/2014  . Acute sinus infection 03/27/2014  . Asthma   . Asthmatic bronchitis   . CAD (coronary artery disease), native coronary artery, DES placed to 1st diag. 03/26/14; residual disease to LAD with FFR to 0.62 and RCA of 50-60%- plan for PCI to LAD 03/27/2014  . Coronary artery disease   . Dyslipidemia, goal LDL below 70 03/27/2014  . GI bleed 03/27/2014  . Hyperlipemia   . Hypertension    Norvasc  . MVP (mitral valve prolapse)    Dr Calvin Calvin  . Non Q wave myocardial infarction (HCC) 03/23/2014  . Psoriasis   . Rhinitis   . S/P minimally invasive mitral valve repair 05/31/2019   Complex valvuloplasty including quadrangular resection of posterior leaflet, sliding leaflet annuloplasty, autologous pericardial patch augmentation of P1 segment of posterior leaflet and 34 mm Sorin Memo 4D ring  annuloplasty via right mini thoracotomy approach    Past Surgical History:  Procedure Laterality Date  . CORONARY ANGIOPLASTY WITH STENT PLACEMENT  03/25/2014; 04/16/2014   "1 + 1"  . FLEXIBLE SIGMOIDOSCOPY Left 03/28/2014   Procedure: FLEXIBLE SIGMOIDOSCOPY;  Surgeon: [name], MD;  Location: [facility] ENDOSCOPY;  Service: Endoscopy;  Laterality: Left;  . IR THORACENTESIS ASP PLEURAL SPACE W/IMG GUIDE  06/12/2019  . LEFT HEART CATHETERIZATION WITH CORONARY ANGIOGRAM N/A 03/26/2014   Procedure: LEFT HEART CATHETERIZATION WITH CORONARY ANGIOGRAM;  Surgeon: Calvin Noe, MD;  Location: [facility] CATH LAB;  Service: Cardiovascular;  Laterality: N/A;  . MITRAL VALVE REPAIR Right 05/31/2019   Procedure: MINIMALLY INVASIVE MITRAL VALVE REPAIR (MVR) Using Memo 4D Ring Size ;  Surgeon: Purcell Nails, MD;  Location: [facility] OR;  Service: Open Heart Surgery;  Laterality: Right;  . nasal polyps    . PERCUTANEOUS CORONARY STENT INTERVENTION (PCI-S) N/A 04/16/2014   Procedure: PERCUTANEOUS CORONARY STENT INTERVENTION (PCI-S);  Surgeon: Calvin Noe, MD;  Location: [facility] CATH LAB;  Service: Cardiovascular;  Laterality: N/A;  . RIGHT/LEFT HEART CATH AND CORONARY ANGIOGRAPHY N/A 03/14/2019   Procedure: RIGHT/LEFT HEART CATH AND CORONARY ANGIOGRAPHY;  Surgeon: [name], MD;  Location: [facility] INVASIVE CV LAB;  Service: Cardiovascular;  Laterality: N/A;  . TEE WITHOUT CARDIOVERSION N/A 03/05/2019   Procedure: TRANSESOPHAGEAL ECHOCARDIOGRAM (TEE);  Surgeon: [name], MD;  Location: [facility] ENDOSCOPY;  Service: Cardiovascular;  Laterality:  N/A;  . TEE WITHOUT CARDIOVERSION N/A 05/31/2019   Procedure: TRANSESOPHAGEAL ECHOCARDIOGRAM (TEE);  Surgeon: Purcell Nails, MD;  Location: [facility] OR;  Service: Open Heart Surgery;  Laterality: N/A;  . WISDOM TOOTH EXTRACTION      Current Medications: Current Meds  Medication Sig  . albuterol (PROAIR HFA) 108 (90 BASE) MCG/ACT inhaler Inhale 2 puffs into the lungs every 6 (six)  hours as needed for wheezing or shortness of breath.  Marland Kitchen amLODipine (NORVASC) 5 MG tablet Take 1 tablet (5 mg total) by mouth daily. Please keep upcoming appt in April 2022 with Dr. [name] for future refills. Thank you  . aspirin EC 81 MG tablet Take 1 tablet (81 mg total) by mouth daily.  . budesonide-formoterol (SYMBICORT) 80-4.5 MCG/ACT inhaler Inhale into the lungs.  . cetirizine (ZYRTEC) 10 MG tablet Take 10 mg by mouth daily as needed for allergies.   . fenofibrate 160 MG tablet Take 160 mg by mouth daily.   . fluticasone (CUTIVATE) 0.05 % cream Apply 1 application topically every evening.  . fluticasone (FLONASE) 50 MCG/ACT nasal spray Place 1 spray into both nostrils every evening.  Marland Kitchen icosapent Ethyl (VASCEPA) 1 g capsule Take 2 capsules (2 g total) by mouth 2 (two) times daily. Please keep upcoming appt in April 2022 with Dr. [name] before anymore refills. Thank you  . metoprolol tartrate (LOPRESSOR) 25 MG tablet Take 0.5 tablets (12.5 mg total) by mouth 2 (two) times daily. Please make yearly appt with Dr. [name] for October before anymore refills. 1st attempt  . montelukast (SINGULAIR) 10 MG tablet Take 1 tablet (10 mg total) by mouth at bedtime.  . Multiple Vitamin (MULTIVITAMIN WITH MINERALS) TABS tablet Take 1 tablet by mouth daily.  . rosuvastatin (CRESTOR) 10 MG tablet Take 1 tablet (10 mg total) by mouth daily. Pt needs to keep upcoming appt scheduled for April before more refills.     Allergies:   Brilinta [ticagrelor] and Daliresp [roflumilast]   Social History   Socioeconomic History  . Marital status: Married    Spouse name: Not on file  . Number of children: 0  . Years of education: Not on file  . Highest education level: Not on file  Occupational History  . Occupation: Insurance underwriter  Tobacco Use  . Smoking status: Never Smoker  . Smokeless tobacco: Never Used  Substance and Sexual Activity  . Alcohol use: No  . Drug use: No  . Sexual activity: Yes  Other Topics  Concern  . Not on file  Social History Narrative  . Not on file   Social Determinants of Health   Financial Resource Strain: Not on file  Food Insecurity: Not on file  Transportation Needs: Not on file  Physical Activity: Not on file  Stress: Not on file  Social Connections: Not on file     Family History: The patient's family history includes Asthma in his maternal grandmother; Emphysema in his maternal grandmother; Heart disease in his maternal uncle and mother; Hypertension in his maternal grandmother; Prostate cancer in his paternal grandfather; Stroke in his maternal uncle. There is no history of Heart attack.  ROS:   Please see the history of present illness.    No new data.  Was concerned that COVID-19 vaccine could have harmed his heart.  All other systems reviewed and are negative.  EKGs/Labs/Other Studies Reviewed:    The following studies were reviewed today: No new imaging data  EKG:  EKG normal sinus rhythm, LVH, nonspecific ST-T  wave abnormality.  When compared to the tracing from 2020, no changes occurred.  Recent Labs: 10/01/2020: Hemoglobin 15.0; Platelets 346.0  Recent Lipid Panel    Component Value Date/Time   CHOL 130 09/18/2019 0856   TRIG 233 (H) 09/18/2019 0856   HDL 42 09/18/2019 0856   CHOLHDL 3.1 09/18/2019 0856   CHOLHDL 2 09/24/2015 0750   VLDL 14.4 09/24/2015 0750   LDLCALC 51 09/18/2019 0856    Physical Exam:    VS:  BP 116/82   Pulse 63   Ht 6\' 1"  (1.854 m)   Wt 215 lb 6.4 oz (97.7 kg)   SpO2 93%   BMI 28.42 kg/m     Wt Readings from Last 3 Encounters:  03/27/21 215 lb 6.4 oz (97.7 kg)  11/26/20 219 lb (99.3 kg)  10/01/20 221 lb (100.2 kg)     GEN: Slightly overweight. No acute distress HEENT: Normal NECK: No JVD. LYMPHATICS: No lymphadenopathy CARDIAC: No murmur. RRR no gallop, or edema. VASCULAR:  Normal Pulses. No bruits. RESPIRATORY:  Clear to auscultation without rales, wheezing or rhonchi  ABDOMEN: Soft, non-tender,  non-distended, No pulsatile mass, MUSCULOSKELETAL: No deformity  SKIN: Warm and dry NEUROLOGIC:  Alert and oriented x 3 PSYCHIATRIC:  Normal affect   ASSESSMENT:    1. S/P minimally invasive mitral valve repair   2. Long term (current) use of anticoagulants   3. Atrial fibrillation, unspecified type (HCC)   4. Essential hypertension   5. Dyslipidemia, goal LDL below 70   6. Coronary artery disease involving native coronary artery of native heart without angina pectoris   7. Educated about COVID-19 virus infection    PLAN:    In order of problems listed above:  1. No mitral regurgitation is heard.  Will need an echo data sometime in the future perhaps in the next year or 2.  Last echo was done 1-1/2 years ago. 2. Anticoagulation therapy has been discontinued.  He is on aspirin daily. 3. He has not had recurrences of atrial fibrillation. 4. Is excellent.  Continue Lopressor and amlodipine. 5. Continue rosuvastatin 10 mg/day.  Most recent LDL was less than 70. 6. We discussed secondary risk prevention as outlined below.  I stressed the importance of moderate physical activity greater than 150 minutes/week. 7. I counseled the patient to get the second booster shot when it becomes available even his heart and vascular complications.  Overall education and awareness concerning primary/secondary risk prevention was discussed in detail: LDL less than 70, hemoglobin A1c less than 7, blood pressure target less than 130/80 mmHg, >150 minutes of moderate aerobic activity per week, avoidance of smoking, weight control (via diet and exercise), and continued surveillance/management of/for obstructive sleep apnea.    Medication Adjustments/Labs and Tests Ordered: Current medicines are reviewed at length with the patient today.  Concerns regarding medicines are outlined above.  Orders Placed This Encounter  Procedures  . EKG 12-Lead   No orders of the defined types were placed in this  encounter.   There are no Patient Instructions on file for this visit.   Signed, 12/01/20, MD  03/27/2021 9:59 AM    [facility]

## 2021-03-27 ENCOUNTER — Ambulatory Visit: Payer: BC Managed Care – PPO | Admitting: Interventional Cardiology

## 2021-03-27 ENCOUNTER — Other Ambulatory Visit: Payer: Self-pay

## 2021-03-27 ENCOUNTER — Encounter: Payer: Self-pay | Admitting: Interventional Cardiology

## 2021-03-27 VITALS — BP 116/82 | HR 63 | Ht 73.0 in | Wt 215.4 lb

## 2021-03-27 DIAGNOSIS — I4891 Unspecified atrial fibrillation: Secondary | ICD-10-CM | POA: Diagnosis not present

## 2021-03-27 DIAGNOSIS — Z9889 Other specified postprocedural states: Secondary | ICD-10-CM

## 2021-03-27 DIAGNOSIS — I2510 Atherosclerotic heart disease of native coronary artery without angina pectoris: Secondary | ICD-10-CM

## 2021-03-27 DIAGNOSIS — Z7901 Long term (current) use of anticoagulants: Secondary | ICD-10-CM

## 2021-03-27 DIAGNOSIS — I10 Essential (primary) hypertension: Secondary | ICD-10-CM | POA: Diagnosis not present

## 2021-03-27 DIAGNOSIS — E785 Hyperlipidemia, unspecified: Secondary | ICD-10-CM

## 2021-03-27 DIAGNOSIS — Z7189 Other specified counseling: Secondary | ICD-10-CM

## 2021-03-27 NOTE — Patient Instructions (Signed)
Medication Instructions:  Your physician recommends that you continue on your current medications as directed. Please refer to the Current Medication list given to you today.  *If you need a refill on your cardiac medications before your next appointment, please call your pharmacy*   Lab Work: None If you have labs (blood work) drawn today and your tests are completely normal, you will receive your results only by: . MyChart Message (if you have MyChart) OR . A paper copy in the mail If you have any lab test that is abnormal or we need to change your treatment, we will call you to review the results.   Testing/Procedures: None   Follow-Up: At [facility], you and your health needs are our priority.  As part of our continuing mission to provide you with exceptional heart care, we have created designated Provider Care Teams.  These Care Teams include your primary Cardiologist (physician) and Advanced Practice Providers (APPs -  Physician Assistants and Nurse Practitioners) who all work together to provide you with the care you need, when you need it.  We recommend signing up for the patient portal called "MyChart".  Sign up information is provided on this After Visit Summary.  MyChart is used to connect with patients for Virtual Visits (Telemedicine).  Patients are able to view lab/test results, encounter notes, upcoming appointments, etc.  Non-urgent messages can be sent to your provider as well.   To learn more about what you can do with MyChart, go to [email or web address].    Your next appointment:   1 year(s)  The format for your next appointment:   In Person  Provider:   You may see Henry W Smith III, MD or one of the following Advanced Practice Providers on your designated Care Team:    Jill McDaniel, NP    Other Instructions  Your provider recommends that you maintain 150 minutes per week of moderate aerobic activity.   

## 2021-04-15 ENCOUNTER — Other Ambulatory Visit: Payer: Self-pay

## 2021-04-15 MED ORDER — AMLODIPINE BESYLATE 5 MG PO TABS: 5.0000 mg | ORAL_TABLET | Freq: Every day | ORAL | 3 refills | Status: AC

## 2021-05-01 ENCOUNTER — Other Ambulatory Visit: Payer: Self-pay | Admitting: *Deleted

## 2021-05-01 MED ORDER — ROSUVASTATIN CALCIUM 10 MG PO TABS: 10.0000 mg | ORAL_TABLET | Freq: Every day | ORAL | 3 refills | Status: DC

## 2021-06-19 ENCOUNTER — Other Ambulatory Visit: Payer: Self-pay | Admitting: *Deleted

## 2021-06-19 MED ORDER — ICOSAPENT ETHYL 1 G PO CAPS: 2.0000 g | ORAL_CAPSULE | Freq: Two times a day (BID) | ORAL | 3 refills | Status: DC

## 2021-06-20 ENCOUNTER — Other Ambulatory Visit: Payer: Self-pay | Admitting: Pulmonary Disease

## 2022-01-14 ENCOUNTER — Other Ambulatory Visit: Payer: Self-pay | Admitting: Pulmonary Disease

## 2022-06-01 ENCOUNTER — Other Ambulatory Visit: Payer: Self-pay

## 2022-06-01 MED ORDER — ROSUVASTATIN CALCIUM 10 MG PO TABS: 10.0000 mg | ORAL_TABLET | Freq: Every day | ORAL | 0 refills | Status: AC

## 2022-07-17 NOTE — Progress Notes (Unsigned)
Cardiology Office Note:    Date:  07/19/2022   ID:  Calvin [name], DOB 1966/11/23, MRN [id number]  PCP:  [name], MD  Cardiologist:  [name], MD   Referring MD: [name], MD   Chief Complaint  Patient presents with   Coronary Artery Disease   Cardiac Valve Problem    Mitral regurgitation    History of Present Illness:    Calvin [name] is a 56 y.o. male with a hx of coronary artery disease status post DES LAD and diagonal 02/2014, HLD  , severe mitral valve regurgitation due to prolapse status post minimally invasive mitral valve repair by Dr. Cornelius Moras 05/31/2019.  Preop cath showed patent stents in the LAD & diagonal, 70-80% ostial Cfx, 50-60% mid Cfx, 40% prox-med RCA, Readmitted 06/11/19 with right pleural effusion now resolved.  No complaints.  No angina.  No medication side effects.  No heart failure symptoms.  Compliant with medications.  Getting greater than 150 minutes of moderate activity per week.  Last imaging 2020.  Past Medical History:  Diagnosis Date   ACS (acute coronary syndrome) with NSTEMI 03/25/2014   Acute sinus infection 03/27/2014   Asthma    Asthmatic bronchitis    CAD (coronary artery disease), native coronary artery, DES placed to 1st diag. 03/26/14; residual disease to LAD with FFR to 0.62 and RCA of 50-60%- plan for PCI to LAD 03/27/2014   Coronary artery disease    Dyslipidemia, goal LDL below 70 03/27/2014   GI bleed 03/27/2014   Hyperlipemia    Hypertension    Norvasc   MVP (mitral valve prolapse)    Dr [name]   Non Q wave myocardial infarction (HCC) 03/23/2014   Psoriasis    Rhinitis    S/P minimally invasive mitral valve repair 05/31/2019   Complex valvuloplasty including quadrangular resection of posterior leaflet, sliding leaflet annuloplasty, autologous pericardial patch augmentation of P1 segment of posterior leaflet and 34 mm Sorin Memo 4D ring annuloplasty via right mini thoracotomy approach    Past Surgical History:  Procedure  Laterality Date   CORONARY ANGIOPLASTY WITH STENT PLACEMENT  03/25/2014; 04/16/2014   "1 + 1"   FLEXIBLE SIGMOIDOSCOPY Left 03/28/2014   Procedure: FLEXIBLE SIGMOIDOSCOPY;  Surgeon: [name], MD;  Location: [facility] ENDOSCOPY;  Service: Endoscopy;  Laterality: Left;   IR THORACENTESIS ASP PLEURAL SPACE W/IMG GUIDE  06/12/2019   LEFT HEART CATHETERIZATION WITH CORONARY ANGIOGRAM N/A 03/26/2014   Procedure: LEFT HEART CATHETERIZATION WITH CORONARY ANGIOGRAM;  Surgeon: [name], MD;  Location: [facility] CATH LAB;  Service: Cardiovascular;  Laterality: N/A;   MITRAL VALVE REPAIR Right 05/31/2019   Procedure: MINIMALLY INVASIVE MITRAL VALVE REPAIR (MVR) Using Memo 4D Ring Size ;  Surgeon: Purcell Nails, MD;  Location: [facility] OR;  Service: Open Heart Surgery;  Laterality: Right;   nasal polyps     PERCUTANEOUS CORONARY STENT INTERVENTION (PCI-S) N/A 04/16/2014   Procedure: PERCUTANEOUS CORONARY STENT INTERVENTION (PCI-S);  Surgeon: [name], MD;  Location: [facility] CATH LAB;  Service: Cardiovascular;  Laterality: N/A;   RIGHT/LEFT HEART CATH AND CORONARY ANGIOGRAPHY N/A 03/14/2019   Procedure: RIGHT/LEFT HEART CATH AND CORONARY ANGIOGRAPHY;  Surgeon: [name], MD;  Location: [facility] INVASIVE CV LAB;  Service: Cardiovascular;  Laterality: N/A;   TEE WITHOUT CARDIOVERSION N/A 03/05/2019   Procedure: TRANSESOPHAGEAL ECHOCARDIOGRAM (TEE);  Surgeon: [name], MD;  Location: [facility] ENDOSCOPY;  Service: Cardiovascular;  Laterality: N/A;   TEE WITHOUT CARDIOVERSION N/A 05/31/2019  Procedure: TRANSESOPHAGEAL ECHOCARDIOGRAM (TEE);  Surgeon: Purcell Nails, MD;  Location: [facility] OR;  Service: Open Heart Surgery;  Laterality: N/A;   WISDOM TOOTH EXTRACTION      Current Medications: Current Meds  Medication Sig   albuterol (PROAIR HFA) 108 (90 BASE) MCG/ACT inhaler Inhale 2 puffs into the lungs every 6 (six) hours as needed for wheezing or shortness of breath.   amLODipine (NORVASC) 5 MG tablet Take 1 tablet  (5 mg total) by mouth daily.   aspirin EC 81 MG tablet Take 1 tablet (81 mg total) by mouth daily.   cetirizine (ZYRTEC) 10 MG tablet Take 10 mg by mouth daily as needed for allergies.    fenofibrate 160 MG tablet Take 160 mg by mouth daily.    fluticasone (CUTIVATE) 0.05 % cream Apply 1 application topically every evening.   fluticasone (FLONASE) 50 MCG/ACT nasal spray Place 1 spray into both nostrils every evening.   icosapent Ethyl (VASCEPA) 1 g capsule Take 2 capsules (2 g total) by mouth 2 (two) times daily. Please keep upcoming appt in April 2022 with Dr. [name] before anymore refills. Thank you   metoprolol tartrate (LOPRESSOR) 25 MG tablet Take 0.5 tablets (12.5 mg total) by mouth 2 (two) times daily. Please make yearly appt with Dr. [name] for October before anymore refills. 1st attempt   montelukast (SINGULAIR) 10 MG tablet TAKE ONE TABLET BY MOUTH AT BEDTIME   Multiple Vitamin (MULTIVITAMIN WITH MINERALS) TABS tablet Take 1 tablet by mouth daily.   rosuvastatin (CRESTOR) 10 MG tablet Take 1 tablet (10 mg total) by mouth daily.     Allergies:   Brilinta [ticagrelor] and Daliresp [roflumilast]   Social History   Socioeconomic History   Marital status: Married    Spouse name: Not on file   Number of children: 0   Years of education: Not on file   Highest education level: Not on file  Occupational History   Occupation: Insurance underwriter  Tobacco Use   Smoking status: Never   Smokeless tobacco: Never  Substance and Sexual Activity   Alcohol use: No   Drug use: No   Sexual activity: Yes  Other Topics Concern   Not on file  Social History Narrative   Not on file   Social Determinants of Health   Financial Resource Strain: Not on file  Food Insecurity: Not on file  Transportation Needs: Not on file  Physical Activity: Not on file  Stress: Not on file  Social Connections: Not on file     Family History: The patient's family history includes Asthma in his maternal  grandmother; Emphysema in his maternal grandmother; Heart disease in his maternal uncle and mother; Hypertension in his maternal grandmother; Prostate cancer in his paternal grandfather; Stroke in his maternal uncle. There is no history of Heart attack.  ROS:   Please see the history of present illness.    No complaints.  All other systems reviewed and are negative.  EKGs/Labs/Other Studies Reviewed:    The following studies were reviewed today:  ECHOCARDIOGRAM 04/2019: IMPRESSIONS   1. The left ventricle has normal systolic function with an ejection  fraction of 60-65%. The cavity size was normal. There is mildly increased  left ventricular wall thickness. Left ventricular diastolic Doppler  parameters are indeterminate. There is  abnormal septal motion consistent with post-operative status.   2. The lateral LV wall is not optimally visualized in all windows,  however appears to have grossly normal function s/p minimally invasive  mitral valve repair, as seen in parasternal short axis and parasternal  long axis images, as well as clip 69, 73  apical long axis.   3. A repair valve is present in the mitral position. Procedure Date:  05/31/2019 There is no Echo evidence of dehiscence of the mitral prosthesis.  Trivial mitral valve regurgitation.   4. Mean diastolic gradient through mitral valve repair is 8 mmHg at HR  102 bpm.   EKG:  EKG sinus rhythm, prominent voltage, with no acute or diagnostic abnormalities.  When compared to March 27, 2021, no changes noted.  Recent Labs: No results found for requested labs within last 365 days.  Recent Lipid Panel    Component Value Date/Time   CHOL 130 09/18/2019 0856   TRIG 233 (H) 09/18/2019 0856   HDL 42 09/18/2019 0856   CHOLHDL 3.1 09/18/2019 0856   CHOLHDL 2 09/24/2015 0750   VLDL 14.4 09/24/2015 0750   LDLCALC 51 09/18/2019 0856    Physical Exam:    VS:  BP (!) 208/80 (BP Location: Left Arm, Patient Position: Sitting, Cuff Size:  Normal)   Pulse 60   Ht 6\' 1"  (1.854 m)   Wt 212 lb 9.6 oz (96.4 kg)   SpO2 95%   BMI 28.05 kg/m     Wt Readings from Last 3 Encounters:  07/19/22 212 lb 9.6 oz (96.4 kg)  03/27/21 215 lb 6.4 oz (97.7 kg)  11/26/20 219 lb (99.3 kg)     GEN: Healthy appearing.  Bearded.. No acute distress HEENT: Normal NECK: No JVD. LYMPHATICS: No lymphadenopathy CARDIAC: No murmur. RRR no gallop, or edema. VASCULAR:  Normal Pulses. No bruits. RESPIRATORY:  Clear to auscultation without rales, wheezing or rhonchi  ABDOMEN: Soft, non-tender, non-distended, No pulsatile mass, MUSCULOSKELETAL: No deformity  SKIN: Warm and dry NEUROLOGIC:  Alert and oriented x 3 PSYCHIATRIC:  Normal affect   ASSESSMENT:    1. S/P minimally invasive mitral valve repair   2. Coronary artery disease involving native coronary artery of native heart without angina pectoris   3. Essential hypertension   4. Dyslipidemia, goal LDL below 70   5. Atrial fibrillation, unspecified type (HCC)    PLAN:    In order of problems listed above:  Doing well without murmur.  2D Doppler echocardiogram prior to next office visit.  I have recommended Dr. 14/01/21. Secondary prevention reviewed.  Most recent laboratory data from May was reviewed.  LDL is 44, A1c is less than 6, and he is compliant with medication. Blood pressure is excellently controlled.  Continue amlodipine and metoprolol. Continue Crestor, Vascepa, and fenofibrate because of hypertriglyceridemia and hyperlipidemia (mixed hyperlipidemia). No clinical recurrence of atrial fibrillation following mitral valve repair.  Anticoagulation has been discontinued.   Overall education and awareness concerning primary/secondary risk prevention was discussed in detail: LDL less than 70, hemoglobin A1c less than 7, blood pressure target less than 130/80 mmHg, >150 minutes of moderate aerobic activity per week, avoidance of smoking, weight control (via diet and exercise), and  continued surveillance/management of/for obstructive sleep apnea.    Medication Adjustments/Labs and Tests Ordered: Current medicines are reviewed at length with the patient today.  Concerns regarding medicines are outlined above.  Orders Placed This Encounter  Procedures   EKG 12-Lead   ECHOCARDIOGRAM COMPLETE   No orders of the defined types were placed in this encounter.   Patient Instructions  Medication Instructions:  Your physician recommends that you continue on your current medications as directed. Please refer  to the Current Medication list given to you today.  *If you need a refill on your cardiac medications before your next appointment, please call your pharmacy*  Lab Work: NONE  Testing/Procedures: Your physician has requested that you have an echocardiogram. Echocardiography is a painless test that uses sound waves to create images of your heart. It provides your doctor with information about the size and shape of your heart and how well your heart's chambers and valves are working. This procedure takes approximately one hour. There are no restrictions for this procedure.  Follow-Up: At [facility], you and your health needs are our priority.  As part of our continuing mission to provide you with exceptional heart care, we have created designated Provider Care Teams.  These Care Teams include your primary Cardiologist (physician) and Advanced Practice Providers (APPs -  Physician Assistants and Nurse Practitioners) who all work together to provide you with the care you need, when you need it.  Your next appointment:   1 year(s)  The format for your next appointment:   In Person  Provider:   Donato Schultz, MD  Important Information About Sugar         Signed, [name], MD  07/19/2022 9:25 AM    [facility]

## 2022-07-19 ENCOUNTER — Ambulatory Visit: Payer: BC Managed Care – PPO | Admitting: Interventional Cardiology

## 2022-07-19 ENCOUNTER — Encounter: Payer: Self-pay | Admitting: Interventional Cardiology

## 2022-07-19 VITALS — BP 208/80 | HR 60 | Ht 73.0 in | Wt 212.6 lb

## 2022-07-19 DIAGNOSIS — I2510 Atherosclerotic heart disease of native coronary artery without angina pectoris: Secondary | ICD-10-CM

## 2022-07-19 DIAGNOSIS — E785 Hyperlipidemia, unspecified: Secondary | ICD-10-CM | POA: Diagnosis not present

## 2022-07-19 DIAGNOSIS — I10 Essential (primary) hypertension: Secondary | ICD-10-CM

## 2022-07-19 DIAGNOSIS — Z9889 Other specified postprocedural states: Secondary | ICD-10-CM | POA: Diagnosis not present

## 2022-07-19 DIAGNOSIS — Z7901 Long term (current) use of anticoagulants: Secondary | ICD-10-CM

## 2022-07-19 DIAGNOSIS — I4891 Unspecified atrial fibrillation: Secondary | ICD-10-CM

## 2022-07-19 NOTE — Patient Instructions (Signed)
Medication Instructions:  Your physician recommends that you continue on your current medications as directed. Please refer to the Current Medication list given to you today.  *If you need a refill on your cardiac medications before your next appointment, please call your pharmacy*  Lab Work: NONE  Testing/Procedures: Your physician has requested that you have an echocardiogram. Echocardiography is a painless test that uses sound waves to create images of your heart. It provides your doctor with information about the size and shape of your heart and how well your heart's chambers and valves are working. This procedure takes approximately one hour. There are no restrictions for this procedure.  Follow-Up: At [facility], you and your health needs are our priority.  As part of our continuing mission to provide you with exceptional heart care, we have created designated Provider Care Teams.  These Care Teams include your primary Cardiologist (physician) and Advanced Practice Providers (APPs -  Physician Assistants and Nurse Practitioners) who all work together to provide you with the care you need, when you need it.  Your next appointment:   1 year(s)  The format for your next appointment:   In Person  Provider:   Donato Schultz, MD  Important Information About Sugar

## 2022-08-03 ENCOUNTER — Ambulatory Visit (HOSPITAL_COMMUNITY): Payer: BC Managed Care – PPO | Attending: Cardiology

## 2022-08-03 DIAGNOSIS — Z9889 Other specified postprocedural states: Secondary | ICD-10-CM | POA: Insufficient documentation

## 2022-08-03 DIAGNOSIS — I340 Nonrheumatic mitral (valve) insufficiency: Secondary | ICD-10-CM | POA: Diagnosis not present

## 2022-08-03 LAB — ECHOCARDIOGRAM COMPLETE
Area-P 1/2: 2.38 cm2
MV M vel: 4.83 m/s
MV Peak grad: 93.3 mmHg
MV VTI: 3.28 cm2
S' Lateral: 2.1 cm

## 2024-05-01 ENCOUNTER — Ambulatory Visit: Payer: BC Managed Care – PPO | Attending: Cardiology | Admitting: Cardiology

## 2024-05-01 VITALS — BP 131/86 | HR 63 | Ht 73.0 in | Wt 209.0 lb

## 2024-05-01 DIAGNOSIS — I10 Essential (primary) hypertension: Secondary | ICD-10-CM

## 2024-05-01 DIAGNOSIS — Z9889 Other specified postprocedural states: Secondary | ICD-10-CM | POA: Diagnosis not present

## 2024-05-01 DIAGNOSIS — I2510 Atherosclerotic heart disease of native coronary artery without angina pectoris: Secondary | ICD-10-CM | POA: Diagnosis not present

## 2024-05-01 DIAGNOSIS — I4891 Unspecified atrial fibrillation: Secondary | ICD-10-CM | POA: Diagnosis not present

## 2024-05-01 MED ORDER — NITROGLYCERIN 0.4 MG SL SUBL: 0.4000 mg | SUBLINGUAL_TABLET | SUBLINGUAL | 3 refills | Status: AC | PRN

## 2024-05-01 NOTE — Patient Instructions (Signed)
 Medication Instructions:  The current medical regimen is effective;  continue present plan and medications.  *If you need a refill on your cardiac medications before your next appointment, please call your pharmacy*  Follow-Up: At [facility], you and your health needs are our priority.  As part of our continuing mission to provide you with exceptional heart care, our providers are all part of one team.  This team includes your primary Cardiologist (physician) and Advanced Practice Providers or APPs (Physician Assistants and Nurse Practitioners) who all work together to provide you with the care you need, when you need it.  Your next appointment:   1 year(s)  Provider:   One of our Advanced Practice Providers (APPs): Melita Springer, PA-C  Friddie Jetty, NP Evaline Hill, NP  Theotis Flake, PA-C Lawana Pray, NP  Willis Harter, PA-C Lovette Rud, PA-C  [place], PA-C Ernest Dick, NP  Marlana Silvan, NP Marcie Sever, PA-C  Laquita Plant, PA-C    Dayna Dunn, PA-C  Scott Weaver, PA-C Palmer Bobo, NP Katlyn West, NP Callie Goodrich, PA-C  Evan Williams, PA-C Sheng Haley, PA-C  Xika Zhao, NP Kathleen Johnson, PA-C   Then, Dorothye Gathers, MD will plan to see you again in 2 year(s).    We recommend signing up for the patient portal called "MyChart".  Sign up information is provided on this After Visit Summary.  MyChart is used to connect with patients for Virtual Visits (Telemedicine).  Patients are able to view lab/test results, encounter notes, upcoming appointments, etc.  Non-urgent messages can be sent to your provider as well.   To learn more about what you can do with MyChart, go to [email or web address].

## 2024-05-01 NOTE — Progress Notes (Signed)
 Cardiology Office Note:  .   Date:  05/01/2024  ID:  Horatio Royle, DOB 01-01-1966, MRN [id number] PCP: Lyle San, MD  [facility] Providers Cardiologist:  Dorothye Gathers, MD    History of Present Illness: .   Calvin [name] is a 58 y.o. male Discussed the use of AI scribe software for clinical note transcription with the patient, who gave verbal consent to proceed.  History of Present Illness Calvin [name] is a 58 year old male with coronary artery disease and mitral valve regurgitation who presents for a follow-up visit.  He has a history of coronary artery disease and underwent placement of a drug-eluting stent to the LAD and diagonal in March 2015. He also had severe mitral valve regurgitation due to prolapse, which was repaired via minimally invasive surgery. Post-surgery, he experienced a right pleural effusion that has since resolved. An echocardiogram in 2020 showed a normal ejection fraction of 65% and a mean diastolic gradient through the mitral valve repair of 8 mmHg.  He feels better than ever, with no chest pain and is able to get around without problems. He has not been exercising as much as he should but is trying to get back into it. He is currently on amlodipine  5 mg daily for blood pressure, aspirin  81 mg daily for his valve, fenofibrate  160 mg daily, and rosuvastatin  10 mg daily for cholesterol management. He has a known allergy  to Brilinta .  His family history is significant for heart disease. His LDL was previously 44, and A1c was less than 6. He reports medication compliance.  He experienced tinnitus as a side effect of metoprolol , which he has since discontinued. He is no longer taking Vascepa .      ROS: No syncope  Studies Reviewed: Aaron Aas   EKG Interpretation Date/Time:  Tuesday May 01 2024 08:56:00 EDT Ventricular Rate:  63 PR Interval:  154 QRS Duration:  94 QT Interval:  418 QTC Calculation: 427 R Axis:   56  Text Interpretation: Normal sinus rhythm  Normal ECG When compared with ECG of 11-Jun-2019 11:58, Rate slower Confirmed by Dorothye Gathers ([id number]) on 05/01/2024 9:17:49 AM    Results LABS LDL: 44 A1c: <6 Triglycerides: 233  DIAGNOSTIC Cardiac catheterization: Patent stents in LAD and diagonal, 80% osteocircumflex, 60% midcircumflex, 40% RCA (05/31/2019) Echocardiogram: Normal ejection fraction 65%, mean diastolic gradient through mitral valve repair 8 mmHg (2020) Risk Assessment/Calculations:            Physical Exam:   VS:  BP 131/86 (BP Location: Left Arm)   Pulse 63   Ht 6\' 1"  (1.854 m)   Wt 209 lb (94.8 kg)   SpO2 97%   BMI 27.57 kg/m    Wt Readings from Last 3 Encounters:  05/01/24 209 lb (94.8 kg)  07/19/22 212 lb 9.6 oz (96.4 kg)  03/27/21 215 lb 6.4 oz (97.7 kg)    GEN: Well nourished, well developed in no acute distress NECK: No JVD; No carotid bruits CARDIAC: RRR, no murmurs, no rubs, no gallops RESPIRATORY:  Clear to auscultation without rales, wheezing or rhonchi  ABDOMEN: Soft, non-tender, non-distended EXTREMITIES:  No edema; No deformity   ASSESSMENT AND PLAN: .    Assessment and Plan Assessment & Plan Coronary artery disease Coronary artery disease with drug-eluting stent placement in the LAD and diagonal in March 2015. Preoperative catheterization in June 2020 showed patent stents, 80% stenosis in the osteocircumflex, 60% in the midcircumflex, and 40% in the RCA. Currently asymptomatic with no angina and  exercises well. Blood pressure is well-managed with current medication regimen. - Continue current medication regimen including aspirin  81 mg daily.  Mitral valve regurgitation, status post repair Severe mitral valve regurgitation due to prolapse, status post minimally invasive mitral valve repair. Postoperative course complicated by right pleural effusion, now resolved. Echocardiogram in 2020 showed normal ejection fraction of 65% and mean diastolic gradient through mitral valve repair was 8 mmHg.  Currently asymptomatic and doing well. The repair has prevented further cardiac weakening. - Continue aspirin  81 mg daily for valve maintenance. - Ensure dental prophylaxis with antibiotics for dental procedures.  Hyperlipidemia Hyperlipidemia with previous LDL of 44 and triglycerides of 233. Currently on rosuvastatin  10 mg daily and fenofibrate  160 mg daily. Triglycerides slightly elevated but managed with current medication. - Continue rosuvastatin  10 mg daily. - Continue fenofibrate  160 mg daily.  No atrial fibrillation following MVR. No longer on anticoagulation.   Tinnitus Tinnitus possibly related to previous use of metoprolol , which has been discontinued. Persistent ringing in the ears.  General Health Maintenance Encouraged to maintain regular exercise and follow a Mediterranean diet for overall cardiovascular health. - Follow up in one year with an advanced practice provider and in two years with the cardiologist.         Dispo: 1 yr APP, 2 yr me  Signed, Dorothye Gathers, MD
